# Patient Record
Sex: Male | Born: 1985
Health system: Southern US, Community
[De-identification: ages and names within clinical notes are randomized; demographics above are authoritative.]

## PROBLEM LIST (undated history)

## (undated) DIAGNOSIS — E785 Hyperlipidemia, unspecified: Secondary | ICD-10-CM

## (undated) DIAGNOSIS — K602 Anal fissure, unspecified: Secondary | ICD-10-CM

## (undated) DIAGNOSIS — Z8489 Family history of other specified conditions: Secondary | ICD-10-CM

## (undated) DIAGNOSIS — F419 Anxiety disorder, unspecified: Secondary | ICD-10-CM

## (undated) DIAGNOSIS — I10 Essential (primary) hypertension: Secondary | ICD-10-CM

## (undated) DIAGNOSIS — N200 Calculus of kidney: Secondary | ICD-10-CM

## (undated) DIAGNOSIS — R509 Fever, unspecified: Secondary | ICD-10-CM

## (undated) DIAGNOSIS — R7989 Other specified abnormal findings of blood chemistry: Secondary | ICD-10-CM

## (undated) DIAGNOSIS — Z0000 Encounter for general adult medical examination without abnormal findings: Secondary | ICD-10-CM

## (undated) DIAGNOSIS — R945 Abnormal results of liver function studies: Secondary | ICD-10-CM

## (undated) DIAGNOSIS — E538 Deficiency of other specified B group vitamins: Secondary | ICD-10-CM

## (undated) DIAGNOSIS — K219 Gastro-esophageal reflux disease without esophagitis: Secondary | ICD-10-CM

## (undated) DIAGNOSIS — I456 Pre-excitation syndrome: Secondary | ICD-10-CM

## (undated) DIAGNOSIS — J020 Streptococcal pharyngitis: Secondary | ICD-10-CM

## (undated) HISTORY — DX: Essential (primary) hypertension: I10

## (undated) HISTORY — DX: Encounter for general adult medical examination without abnormal findings: Z00.00

## (undated) HISTORY — DX: Anxiety disorder, unspecified: F41.9

## (undated) HISTORY — DX: Calculus of kidney: N20.0

## (undated) HISTORY — DX: Anal fissure, unspecified: K60.2

## (undated) HISTORY — DX: Streptococcal pharyngitis: J02.0

## (undated) HISTORY — DX: Abnormal results of liver function studies: R94.5

## (undated) HISTORY — DX: Deficiency of other specified B group vitamins: E53.8

## (undated) HISTORY — PX: SIGMOIDOSCOPY: SUR1295

## (undated) HISTORY — DX: Gastro-esophageal reflux disease without esophagitis: K21.9

## (undated) HISTORY — DX: Hyperlipidemia, unspecified: E78.5

## (undated) HISTORY — DX: Fever, unspecified: R50.9

## (undated) HISTORY — DX: Other specified abnormal findings of blood chemistry: R79.89

---
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
## 2005-03-15 ENCOUNTER — Ambulatory Visit (HOSPITAL_COMMUNITY): Admission: RE | Admit: 2005-03-15 | Discharge: 2005-03-15 | Payer: Self-pay | Admitting: Family Medicine

## 2005-10-08 ENCOUNTER — Ambulatory Visit (HOSPITAL_COMMUNITY): Admission: RE | Admit: 2005-10-08 | Discharge: 2005-10-08 | Payer: Self-pay | Admitting: Pediatrics

## 2007-01-14 ENCOUNTER — Ambulatory Visit (HOSPITAL_COMMUNITY): Admission: RE | Admit: 2007-01-14 | Discharge: 2007-01-14 | Payer: Self-pay | Admitting: Urology

## 2007-01-16 ENCOUNTER — Ambulatory Visit (HOSPITAL_COMMUNITY): Admission: RE | Admit: 2007-01-16 | Discharge: 2007-01-16 | Payer: Self-pay | Admitting: Urology

## 2008-08-08 ENCOUNTER — Ambulatory Visit (HOSPITAL_COMMUNITY): Admission: RE | Admit: 2008-08-08 | Discharge: 2008-08-08 | Payer: Self-pay | Admitting: Family Medicine

## 2008-11-06 IMAGING — CR DG ABDOMEN 1V
2 series · 2 of 2 positions shown · non-contrast
Comparison: 01/14/2007

CLINICAL DATA: Post lithotripsy

ABDOMEN - 1 VIEW

[view not recorded (1 of 2)]
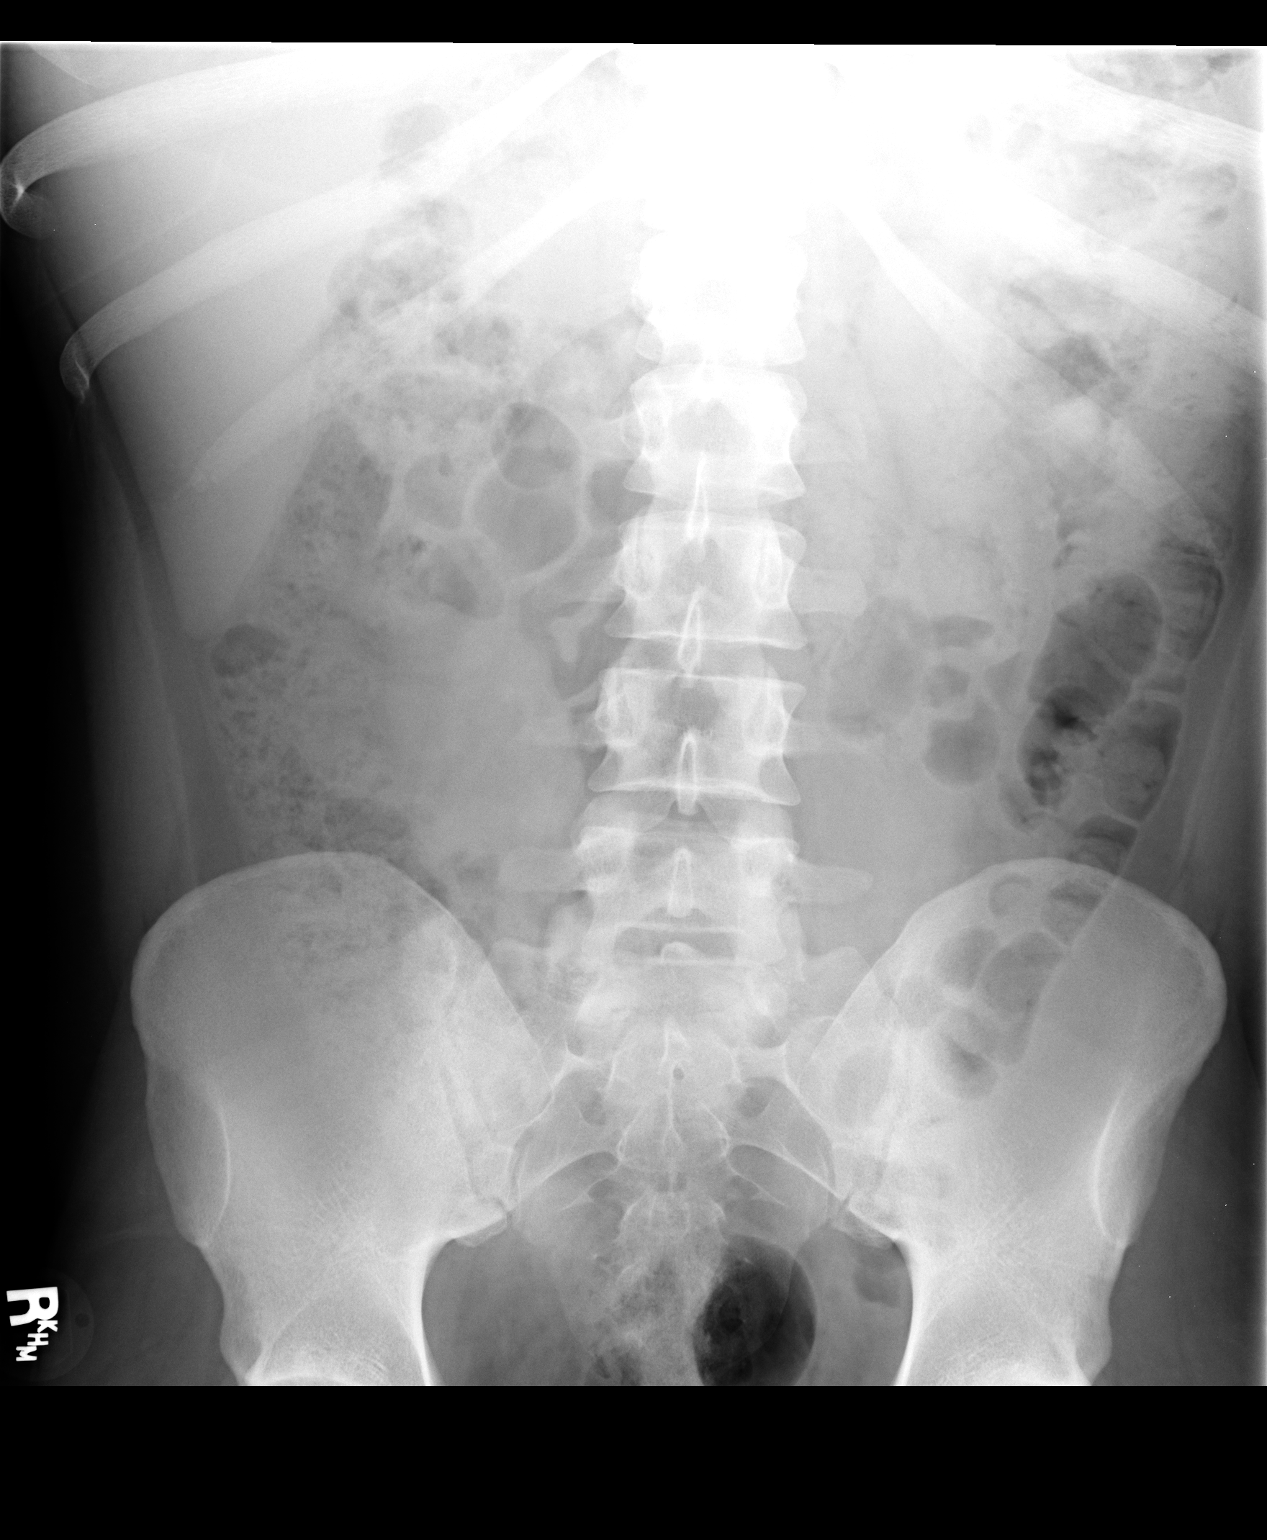

[view not recorded (2 of 2)]
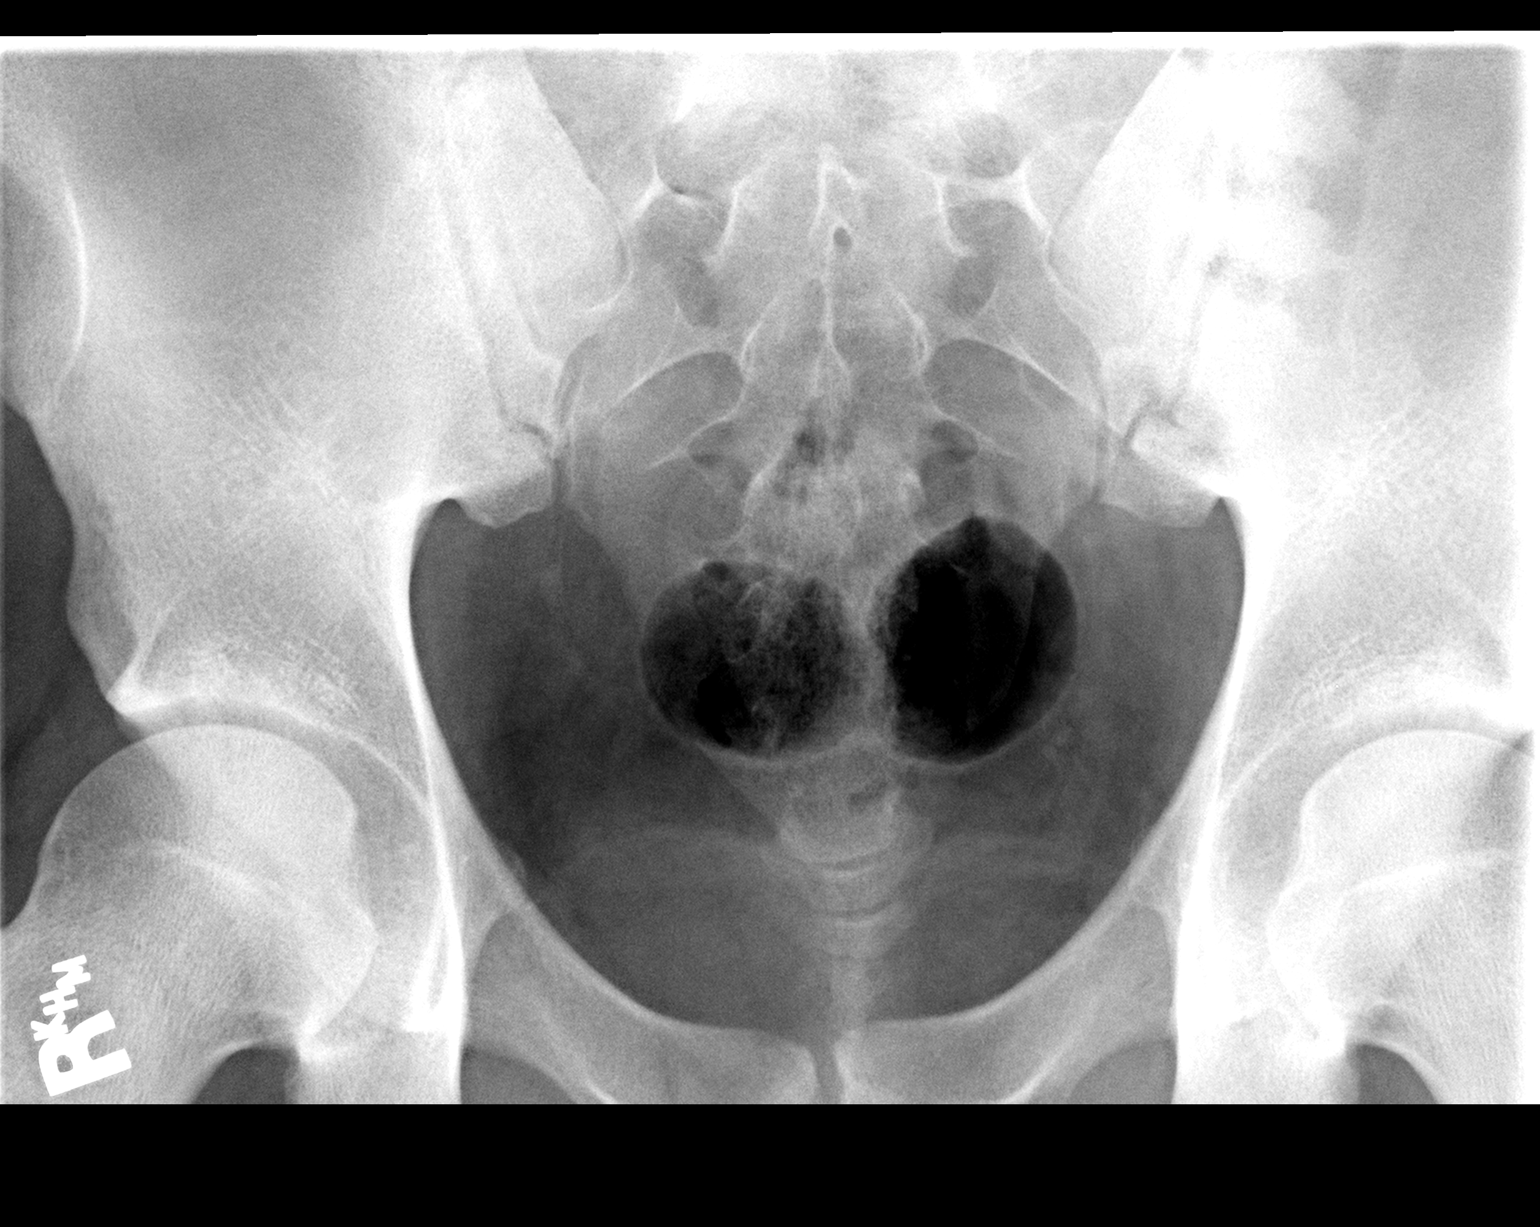

[2 of 2 positions shown; findings below may reference images not displayed]

FINDINGS: Decreasing described 13 mm calculus in the region of the left renal
pelvis is no longer visualized. No definite stones along the expected course of
the left ureter. No stones in the right. No organomegaly. Normal bowel gas
pattern.
IMPRESSION: Interval resolution of the left-sided stone. No definite calculi currently.

## 2010-06-12 NOTE — H&P (Signed)
NAME:  Nicholas Robertson, Nicholas Robertson NO.:  [id number]   MEDICAL RECORD NO.:  [id number]          PATIENT TYPE:  AMB   LOCATION:  DAY                           FACILITY:  [facility]   PHYSICIAN:  Dennie Maizes, M.D.   DATE OF BIRTH:  [birth year]   DATE OF ADMISSION:  01/14/2007  DATE OF DISCHARGE:  LH                              HISTORY & PHYSICAL   CHIEF COMPLAINT:  Left flank pain, large left renal calculus.   HISTORY OF PRESENT ILLNESS:  A 25 year old male has a history of  recurrent urolithiasis.  He passed a stone about a year ago.  He has  been evaluated for intermittent left flank pain.  CT scan of abdomen and  pelvis revealed an 11 x 7 mm size left renal calculus with mild  obstruction.  The patient is brought to the short stay center today for  extracorporeal shockwave lithotripsy of the left renal calculus.   The patient denied having any fever, chills, voiding difficulty, or  gross hematuria at present.  He has urinary frequency x2-3 and nocturia  x1.   PAST MEDICAL HISTORY:  Status post hernia repair during infancy.   MEDICATIONS:  Hydrocodone for pain.   ALLERGIES:  No known drug allergies.   FAMILY HISTORY:  Positive for kidney stones, diabetes mellitus, and  hypertension.   PHYSICAL EXAMINATION:  HEENT:  Normal.  NECK:  No masses.  LUNGS:  Clear to auscultation.  HEART:  Regular rate and rhythm.  No murmurs.  ABDOMEN:  Soft.  No palpable flank mass or CVA tenderness.  Bladder is  not palpable.  GENITOURINARY:  Penis and testes are normal.   IMPRESSION:  Large left renal calculus 11 x 7 mm in size with left flank  pain.   PLAN:  I have discussed with the patient regarding the management  options for the large left renal calculus.  He is scheduled to undergo  ESL of the stone.  I have discussed with the patient regarding the  diagnosis, operative details, alternative treatments, outcome, possible  risks and complications and he has agreed for the procedure  to be done.      Dennie Maizes, M.D.  Electronically Signed     SK/MEDQ  D:  01/13/2007  T:  01/13/2007  Job:  [id number]   cc:   Jeoffrey Massed, MD  Fax: [phone]

## 2011-01-29 HISTORY — PX: FLEXIBLE SIGMOIDOSCOPY: SHX1649

## 2012-10-19 ENCOUNTER — Ambulatory Visit (INDEPENDENT_AMBULATORY_CARE_PROVIDER_SITE_OTHER): Payer: 59 | Admitting: Family Medicine

## 2012-10-19 VITALS — BP 138/90 | HR 99 | Temp 98.3°F | Ht 70.0 in | Wt 216.0 lb

## 2012-10-19 DIAGNOSIS — J020 Streptococcal pharyngitis: Secondary | ICD-10-CM

## 2012-10-19 MED ORDER — CEFTRIAXONE SODIUM 500 MG IJ SOLR
500.0000 mg | Freq: Once | INTRAMUSCULAR | Status: AC
  Administered 2012-10-19: 500 mg via INTRAMUSCULAR

## 2012-10-20 ENCOUNTER — Encounter: Payer: Self-pay | Admitting: Family Medicine

## 2012-10-20 DIAGNOSIS — J020 Streptococcal pharyngitis: Secondary | ICD-10-CM | POA: Insufficient documentation

## 2012-10-20 HISTORY — DX: Streptococcal pharyngitis: J02.0

## 2012-10-20 NOTE — Progress Notes (Signed)
Patient ID: Nicholas Robertson, male   DOB: 05/01/1985, 27 y.o.   MRN: [id number] Nicholas Robertson [id number] Nov 13, 1985 10/20/2012      Progress Note-Follow Up  Subjective  Chief Complaint  Chief Complaint  Patient presents with  . Sore Throat    pts throat started hurting on Saturday and went to after hours and was diagnosed with strep. pt feels that his glands are more swollen now    HPI  Patient is a 27 yo male in today for evaluation of strep pharnygitis, was feeling ill over the weekend and went to urgent care. Testing confirmed strep and he was given PCN shot, initially he felt better but now his throat is swelling more and hurting more. Has had HA/fevers/malaise and just now some minor head congestion. No cough/cp/palp/sob/gu c/o.  Past Medical History  Diagnosis Date  . Strep pharyngitis 10/20/2012    History reviewed. No pertinent past surgical history.  History reviewed. No pertinent family history.  History   Social History  . Marital Status: Single    Spouse Name: N/A    Number of Children: N/A  . Years of Education: N/A   Occupational History  . Not on file.   Social History Main Topics  . Smoking status: Never Smoker   . Smokeless tobacco: Never Used  . Alcohol Use: No  . Drug Use: Not on file  . Sexual Activity: Not on file   Other Topics Concern  . Not on file   Social History Narrative  . No narrative on file    No current outpatient prescriptions on file prior to visit.   No current facility-administered medications on file prior to visit.    No Known Allergies  Review of Systems  Review of Systems  Constitutional: Positive for fever and malaise/fatigue.  HENT: Positive for congestion and sore throat.   Eyes: Negative for discharge.  Respiratory: Negative for cough, sputum production and shortness of breath.   Cardiovascular: Negative for chest pain, palpitations and leg swelling.  Gastrointestinal: Negative for nausea, abdominal pain  and diarrhea.  Genitourinary: Negative for dysuria.  Musculoskeletal: Positive for myalgias. Negative for falls.  Skin: Negative for rash.  Neurological: Positive for headaches. Negative for loss of consciousness.  Endo/Heme/Allergies: Negative for polydipsia.  Psychiatric/Behavioral: Negative for depression and suicidal ideas. The patient is not nervous/anxious and does not have insomnia.     Objective  BP 138/90  Pulse 99  Temp(Src) 98.3 F (36.8 C) (Oral)  Ht 5\' 10"  (1.778 m)  Wt 216 lb (97.977 kg)  BMI 30.99 kg/m2  SpO2 97%  Physical Exam  Physical Exam  Constitutional: He is oriented to person, place, and time and well-developed, well-nourished, and in no distress. No distress.  HENT:  Head: Normocephalic and atraumatic.  Oropharynx erythematous with 2 + tonsils b/l  Eyes: Conjunctivae are normal.  Neck: Neck supple. No thyromegaly present.  Cardiovascular: Normal rate, regular rhythm and normal heart sounds.   No murmur heard. Pulmonary/Chest: Effort normal and breath sounds normal. No respiratory distress.  Abdominal: He exhibits no distension and no mass. There is no tenderness.  Musculoskeletal: He exhibits no edema.  Neurological: He is alert and oriented to person, place, and time.  Skin: Skin is warm.  Psychiatric: Memory, affect and judgment normal.      Assessment & Plan  Strep pharyngitis Seen in urgent facility over the weekend, strep confirmed and PCN shot given, was intitally feeling better, now worse again. Given shot of Ceftriazone 500  mg IM once and encouraged probiotics, fluids and rest.

## 2012-10-20 NOTE — Assessment & Plan Note (Signed)
Seen in urgent facility over the weekend, strep confirmed and PCN shot given, was intitally feeling better, now worse again. Given shot of Ceftriazone 500 mg IM once and encouraged probiotics, fluids and rest.

## 2012-12-17 ENCOUNTER — Telehealth: Payer: Self-pay | Admitting: Family Medicine

## 2012-12-17 DIAGNOSIS — L309 Dermatitis, unspecified: Secondary | ICD-10-CM

## 2012-12-17 MED ORDER — CLOBETASOL PROPIONATE 0.05 % EX CREA: 1.0000 "application " | TOPICAL_CREAM | Freq: Two times a day (BID) | CUTANEOUS | Status: DC | PRN

## 2012-12-17 NOTE — Telephone Encounter (Signed)
Recurrent and worsening dermatitis. Rx given for Clobetasol, present for evaluation if does not improve

## 2013-01-01 ENCOUNTER — Other Ambulatory Visit: Payer: Self-pay

## 2013-01-01 MED ORDER — AMOXICILLIN-POT CLAVULANATE ER 1000-62.5 MG PO TB12: 1.0000 | ORAL_TABLET | Freq: Two times a day (BID) | ORAL | Status: DC

## 2013-01-01 NOTE — Telephone Encounter (Signed)
Per Dr Abner Greenspan send in Augmentin XR bid X 10 days for pharyngitis.  RX sent

## 2013-02-04 ENCOUNTER — Ambulatory Visit (INDEPENDENT_AMBULATORY_CARE_PROVIDER_SITE_OTHER): Payer: 59 | Admitting: Family Medicine

## 2013-02-04 ENCOUNTER — Ambulatory Visit: Payer: 59 | Admitting: Family Medicine

## 2013-02-04 ENCOUNTER — Encounter: Payer: Self-pay | Admitting: Family Medicine

## 2013-02-04 VITALS — BP 138/102 | HR 90 | Temp 98.3°F | Ht 70.0 in | Wt 220.4 lb

## 2013-02-04 DIAGNOSIS — I10 Essential (primary) hypertension: Secondary | ICD-10-CM

## 2013-02-04 DIAGNOSIS — R5381 Other malaise: Secondary | ICD-10-CM

## 2013-02-04 DIAGNOSIS — R945 Abnormal results of liver function studies: Secondary | ICD-10-CM

## 2013-02-04 DIAGNOSIS — E785 Hyperlipidemia, unspecified: Secondary | ICD-10-CM

## 2013-02-04 DIAGNOSIS — R7989 Other specified abnormal findings of blood chemistry: Secondary | ICD-10-CM

## 2013-02-04 DIAGNOSIS — Z0000 Encounter for general adult medical examination without abnormal findings: Secondary | ICD-10-CM

## 2013-02-04 DIAGNOSIS — R5383 Other fatigue: Secondary | ICD-10-CM

## 2013-02-04 LAB — CBC
HCT: 44.7 % (ref 39.0–52.0)
HEMOGLOBIN: 15.6 g/dL (ref 13.0–17.0)
MCH: 31.1 pg (ref 26.0–34.0)
MCHC: 34.9 g/dL (ref 30.0–36.0)
MCV: 89 fL (ref 78.0–100.0)
Platelets: 248 10*3/uL (ref 150–400)
RBC: 5.02 MIL/uL (ref 4.22–5.81)
RDW: 13.8 % (ref 11.5–15.5)
WBC: 6.6 10*3/uL (ref 4.0–10.5)

## 2013-02-04 MED ORDER — METOPROLOL SUCCINATE ER 25 MG PO TB24: 25.0000 mg | ORAL_TABLET | Freq: Every day | ORAL | Status: DC

## 2013-02-04 NOTE — Progress Notes (Signed)
Pre visit review using our clinic review tool, if applicable. No additional management support is needed unless otherwise documented below in the visit note. 

## 2013-02-05 LAB — HEPATIC FUNCTION PANEL
ALK PHOS: 46 U/L (ref 39–117)
ALT: 68 U/L — AB (ref 0–53)
AST: 35 U/L (ref 0–37)
Albumin: 4.7 g/dL (ref 3.5–5.2)
BILIRUBIN DIRECT: 0.1 mg/dL (ref 0.0–0.3)
Indirect Bilirubin: 0.6 mg/dL (ref 0.0–0.9)
Total Bilirubin: 0.7 mg/dL (ref 0.3–1.2)
Total Protein: 7 g/dL (ref 6.0–8.3)

## 2013-02-05 LAB — LIPID PANEL
CHOL/HDL RATIO: 4.7 ratio
CHOLESTEROL: 231 mg/dL — AB (ref 0–200)
HDL: 49 mg/dL (ref >39)
LDL CALC: 159 mg/dL — AB (ref 0–99)
TRIGLYCERIDES: 115 mg/dL (ref <150)
VLDL: 23 mg/dL (ref 0–40)

## 2013-02-05 LAB — RENAL FUNCTION PANEL
Albumin: 4.7 g/dL (ref 3.5–5.2)
BUN: 10 mg/dL (ref 6–23)
CALCIUM: 9.8 mg/dL (ref 8.4–10.5)
CHLORIDE: 105 meq/L (ref 96–112)
CO2: 26 meq/L (ref 19–32)
CREATININE: 0.81 mg/dL (ref 0.50–1.35)
Glucose, Bld: 80 mg/dL (ref 70–99)
POTASSIUM: 4.3 meq/L (ref 3.5–5.3)
Phosphorus: 3.8 mg/dL (ref 2.3–4.6)
SODIUM: 139 meq/L (ref 135–145)

## 2013-02-05 LAB — TSH: TSH: 2.442 u[IU]/mL (ref 0.350–4.500)

## 2013-02-07 ENCOUNTER — Encounter: Payer: Self-pay | Admitting: Family Medicine

## 2013-02-07 DIAGNOSIS — R945 Abnormal results of liver function studies: Secondary | ICD-10-CM | POA: Insufficient documentation

## 2013-02-07 DIAGNOSIS — I10 Essential (primary) hypertension: Secondary | ICD-10-CM | POA: Insufficient documentation

## 2013-02-07 DIAGNOSIS — E782 Mixed hyperlipidemia: Secondary | ICD-10-CM | POA: Insufficient documentation

## 2013-02-07 DIAGNOSIS — E785 Hyperlipidemia, unspecified: Secondary | ICD-10-CM | POA: Insufficient documentation

## 2013-02-07 DIAGNOSIS — R7989 Other specified abnormal findings of blood chemistry: Secondary | ICD-10-CM | POA: Insufficient documentation

## 2013-02-07 HISTORY — DX: Hyperlipidemia, unspecified: E78.5

## 2013-02-07 HISTORY — DX: Essential (primary) hypertension: I10

## 2013-02-07 NOTE — Assessment & Plan Note (Signed)
Mild, avoid simple carbs, trans fats and increase exercise

## 2013-02-07 NOTE — Progress Notes (Signed)
Patient ID: Nicholas Robertson, male   DOB: 12-21-1985, 28 y.o.   MRN: [id number] Nicholas Robertson [id number] 19-Apr-1985 02/07/2013      Progress Note-Follow Up  Subjective  Chief Complaint  Chief Complaint  Patient presents with  . Hypertension    blood pressure has been running high  . Dizziness    and light headed     HPI  Patient is a 28 year old male who is in today with complaints of feeling somewhat flushed and lightheaded. Has been monitoring his blood pressure and it is running high. He has seeing numbers in the 150s over 100. No chest pain or palpitations. No shortness of breath GI or GU concerns noted.  Past Medical History  Diagnosis Date  . Strep pharyngitis 10/20/2012  . HTN (hypertension) 02/07/2013  . Other and unspecified hyperlipidemia 02/07/2013  . Abnormal LFTs 02/07/2013    History reviewed. No pertinent past surgical history.  Family History  Problem Relation Age of Onset  . Hypertension Mother   . Hyperlipidemia Mother   . Diabetes Mother     pre  . Mental illness Mother     anxiety  . Diabetes Father   . Hypertension Father   . COPD Maternal Grandmother     former smoker  . Heart disease Maternal Grandfather   . Hyperlipidemia Maternal Grandfather   . Diabetes Maternal Grandfather     type 1  . Hearing loss Maternal Grandfather     cabg  . Hypertension Paternal Grandmother   . Psoriasis Paternal Grandmother   . Heart disease Paternal Grandfather     s/p MI  . Hyperlipidemia Paternal Grandfather   . Hypertension Paternal Grandfather   . Polymyalgia rheumatica Paternal Grandfather   . Diabetes Paternal Grandfather     History   Social History  . Marital Status: Single    Spouse Name: N/A    Number of Children: N/A  . Years of Education: N/A   Occupational History  . Not on file.   Social History Main Topics  . Smoking status: Never Smoker   . Smokeless tobacco: Never Used  . Alcohol Use: No  . Drug Use: No  . Sexual Activity: Not  on file     Comment: no dietary restricitons, lives alone   Other Topics Concern  . Not on file   Social History Narrative  . No narrative on file    No current outpatient prescriptions on file prior to visit.   No current facility-administered medications on file prior to visit.    No Known Allergies  Review of Systems  Review of Systems  Constitutional: Negative for fever and malaise/fatigue.  HENT: Negative for congestion.   Eyes: Negative for discharge.  Respiratory: Negative for shortness of breath.   Cardiovascular: Negative for chest pain, palpitations and leg swelling.  Gastrointestinal: Negative for nausea, abdominal pain and diarrhea.  Genitourinary: Negative for dysuria.  Musculoskeletal: Negative for falls.  Skin: Negative for rash.  Neurological: Positive for dizziness. Negative for loss of consciousness and headaches.  Endo/Heme/Allergies: Negative for polydipsia.  Psychiatric/Behavioral: Negative for depression and suicidal ideas. The patient is nervous/anxious. The patient does not have insomnia.     Objective  BP 138/102  Pulse 90  Temp(Src) 98.3 F (36.8 C) (Oral)  Ht 5\' 10"  (1.778 m)  Wt 220 lb 6.4 oz (99.973 kg)  BMI 31.62 kg/m2  SpO2 96%  Physical Exam  Physical Exam  Constitutional: He is oriented to person, place, and time and well-developed,  well-nourished, and in no distress. No distress.  HENT:  Head: Normocephalic and atraumatic.  Eyes: Conjunctivae are normal.  Neck: Neck supple. No thyromegaly present.  Cardiovascular: Normal rate, regular rhythm and normal heart sounds.   No murmur heard. Pulmonary/Chest: Effort normal and breath sounds normal. No respiratory distress.  Abdominal: He exhibits no distension and no mass. There is no tenderness.  Musculoskeletal: He exhibits no edema.  Neurological: He is alert and oriented to person, place, and time.  Skin: Skin is warm.  Psychiatric: Memory, affect and judgment normal.    Lab  Results  Component Value Date   TSH 2.442 02/04/2013   Lab Results  Component Value Date   WBC 6.6 02/04/2013   HGB 15.6 02/04/2013   HCT 44.7 02/04/2013   MCV 89.0 02/04/2013   PLT 248 02/04/2013   Lab Results  Component Value Date   CREATININE 0.81 02/04/2013   BUN 10 02/04/2013   NA 139 02/04/2013   K 4.3 02/04/2013   CL 105 02/04/2013   CO2 26 02/04/2013   Lab Results  Component Value Date   ALT 68* 02/04/2013   AST 35 02/04/2013   ALKPHOS 46 02/04/2013   BILITOT 0.7 02/04/2013   Lab Results  Component Value Date   CHOL 231* 02/04/2013   Lab Results  Component Value Date   HDL 49 02/04/2013   Lab Results  Component Value Date   LDLCALC 159* 02/04/2013   Lab Results  Component Value Date   TRIG 115 02/04/2013   Lab Results  Component Value Date   CHOLHDL 4.7 02/04/2013     Assessment & Plan  Abnormal LFTs Mild, avoid simple carbs, trans fats and increase exercise  HTN (hypertension) Started on metoprolol XL and encouraged to try dash diet  Other and unspecified hyperlipidemia Mild, avoid trans fats, minimize simple carbs and saturated fats, increase exercise

## 2013-02-07 NOTE — Assessment & Plan Note (Signed)
Started on metoprolol XL and encouraged to try dash diet

## 2013-02-07 NOTE — Assessment & Plan Note (Signed)
Mild, avoid trans fats, minimize simple carbs and saturated fats, increase exercise

## 2013-02-08 ENCOUNTER — Encounter: Payer: Self-pay | Admitting: Family Medicine

## 2013-02-08 ENCOUNTER — Ambulatory Visit (INDEPENDENT_AMBULATORY_CARE_PROVIDER_SITE_OTHER): Payer: 59 | Admitting: Family Medicine

## 2013-02-08 VITALS — BP 138/96 | HR 80 | Temp 97.8°F | Ht 70.0 in

## 2013-02-08 DIAGNOSIS — I4729 Other ventricular tachycardia: Secondary | ICD-10-CM

## 2013-02-08 DIAGNOSIS — I456 Pre-excitation syndrome: Secondary | ICD-10-CM

## 2013-02-08 DIAGNOSIS — I10 Essential (primary) hypertension: Secondary | ICD-10-CM

## 2013-02-08 DIAGNOSIS — R000 Tachycardia, unspecified: Secondary | ICD-10-CM

## 2013-02-08 DIAGNOSIS — I472 Ventricular tachycardia: Secondary | ICD-10-CM

## 2013-02-08 NOTE — Progress Notes (Signed)
Patient ID: Nicholas Robertson, male   DOB: 12-23-1985, 28 y.o.   MRN: [id number] Nicholas Robertson [id number] 11-03-1985 02/08/2013      Progress Note-Follow Up  Subjective  Chief Complaint  Chief Complaint  Patient presents with  . Follow-up    high blood pressure    HPI  Patient is a 28 year old Caucasian male who presents today with significant tachycardia. 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 He became flushed, fatigued and diaphoretic. He denies any chest pain, nausea, syncope or presyncope. He tried to continue working but after checking his pulse and finding it to be 2:15 he agreed to sit quietly. He was given metoprolol 50 mg once and his pulse began to normalize. As his pulse normalized his color returned and his diaphoresis resolved. He was comfortable without any shortness of breath, chest pain, presyncope, palpitations or fatigue. He reports having a similar episode in roughly 2000 and after bending over he said that the findings heart racing. At that time he was able to sit quietly until the symptoms resolved. Of note he has recently been started on metoprolol extended release for his elevated blood pressure but had forgotten to take his dose this morning.   Past Medical History  Diagnosis Date  . Strep pharyngitis 10/20/2012  . HTN (hypertension) 02/07/2013  . Other and unspecified hyperlipidemia 02/07/2013  . Abnormal LFTs 02/07/2013  . Wide-complex tachycardia 02/08/2013    History reviewed. No pertinent past surgical history.  Family History  Problem Relation Age of Onset  . Hypertension Mother   . Hyperlipidemia Mother   . Diabetes Mother     pre  . Mental illness Mother     anxiety  . Diabetes Father   . Hypertension Father   . COPD Maternal Grandmother     former smoker  . Heart disease Maternal Grandfather   . Hyperlipidemia Maternal Grandfather   . Diabetes  Maternal Grandfather     type 1  . Hearing loss Maternal Grandfather     cabg  . Hypertension Paternal Grandmother   . Psoriasis Paternal Grandmother   . Heart disease Paternal Grandfather     s/p MI  . Hyperlipidemia Paternal Grandfather   . Hypertension Paternal Grandfather   . Polymyalgia rheumatica Paternal Grandfather   . Diabetes Paternal Grandfather     History   Social History  . Marital Status: Single    Spouse Name: N/A    Number of Children: N/A  . Years of Education: N/A   Occupational History  . Not on file.   Social History Main Topics  . Smoking status: Never Smoker   . Smokeless tobacco: Never Used  . Alcohol Use: No  . Drug Use: No  . Sexual Activity: Not on file     Comment: no dietary restricitons, lives alone   Other Topics Concern  . Not on file   Social History Narrative  . No narrative on file    Current Outpatient Prescriptions on File Prior to Visit  Medication Sig Dispense Refill  . metoprolol succinate (TOPROL XL) 25 MG 24 hr tablet Take 1 tablet (25 mg total) by mouth daily.  30 tablet  1   No current facility-administered medications on file prior to visit.    No Known Allergies  Review of Systems  Review of Systems  Constitutional: Positive for malaise/fatigue and diaphoresis. Negative for fever.  HENT:  Negative for congestion.   Eyes: Negative for discharge.  Respiratory: Negative for shortness of breath.   Cardiovascular: Positive for palpitations. Negative for chest pain and leg swelling.  Gastrointestinal: Negative for nausea, abdominal pain and diarrhea.  Genitourinary: Negative for dysuria.  Musculoskeletal: Negative for falls.  Skin: Negative for rash.  Neurological: Negative for loss of consciousness and headaches.  Endo/Heme/Allergies: Negative for polydipsia.  Psychiatric/Behavioral: Negative for depression and suicidal ideas. The patient is not nervous/anxious and does not have insomnia.     Objective  BP  138/96  Pulse 80  Temp(Src) 97.8 F (36.6 C) (Oral)  Ht 5\' 10"  (1.778 m)  SpO2 98%  Physical Exam  Physical Exam  Constitutional: He is oriented to person, place, and time and well-developed, well-nourished, and in no distress. No distress.  HENT:  Head: Normocephalic and atraumatic.  Eyes: Conjunctivae are normal.  Neck: Neck supple. No thyromegaly present.  Cardiovascular: Normal heart sounds.   No murmur heard. tachycardia  Pulmonary/Chest: Effort normal and breath sounds normal. No respiratory distress.  Abdominal: He exhibits no distension and no mass. There is no tenderness.  Musculoskeletal: He exhibits no edema.  Neurological: He is alert and oriented to person, place, and time.  Skin: Skin is warm.  Psychiatric: Memory, affect and judgment normal.    Lab Results  Component Value Date   TSH 2.442 02/04/2013   Lab Results  Component Value Date   WBC 6.6 02/04/2013   HGB 15.6 02/04/2013   HCT 44.7 02/04/2013   MCV 89.0 02/04/2013   PLT 248 02/04/2013   Lab Results  Component Value Date   CREATININE 0.81 02/04/2013   BUN 10 02/04/2013   NA 139 02/04/2013   K 4.3 02/04/2013   CL 105 02/04/2013   CO2 26 02/04/2013   Lab Results  Component Value Date   ALT 68* 02/04/2013   AST 35 02/04/2013   ALKPHOS 46 02/04/2013   BILITOT 0.7 02/04/2013   Lab Results  Component Value Date   CHOL 231* 02/04/2013   Lab Results  Component Value Date   HDL 49 02/04/2013   Lab Results  Component Value Date   LDLCALC 159* 02/04/2013   Lab Results  Component Value Date   TRIG 115 02/04/2013   Lab Results  Component Value Date   CHOLHDL 4.7 02/04/2013     Assessment & Plan  No problem-specific assessment & plan notes found for this encounter.

## 2013-02-08 NOTE — Assessment & Plan Note (Addendum)
As high as 215 in clinic, with EKG rate was normal but wide QRS complex noted with short PR interval. Discussed case with Dr Angelena Form. With previous episode noted by symptoms in 2010 and patient being symptomatic. Patient would benefit from EP studies, have arranged for urgent consultation. Patient agrees to take it easy today and seek care if worsens. At time of discharge he was asymptomatic. Visit was 30 minutes in duration

## 2013-02-08 NOTE — Patient Instructions (Signed)
Wolff-Parkinson-White Syndrome Wolff-Parkinson-White Syndrome (WPW) is an abnormal heart condition that can cause the heart to beat very fast. CAUSES  In WPW syndrome, an extra electrical connection (pathway) exists between the top chambers of your heart (atria) and the bottom chambers of your heart (ventricles). This is known as an extra (accessory) pathway. This extra pathway can cause the heart to short circuit and beat very fast. SYMPTOMS  Symptoms in WPW syndrome can vary. These include:  Feeling your heart "skip" beats (palpitations).  Dizziness.  Fainting or near fainting.  Sudden death. DIAGNOSIS  WPW can be diagnosed by different test such as:  ECG (electrocardiogram).  Echocardiogram.  Holter monitoring.  Stress testing.  Electrophysiology study.  Laboratory tests that check certain blood levels.  Thyroid study. TREATMENT  WPW is usually treated in two ways:  Radiofrequency destruction (ablation). In this procedure, a thin, flexible tube (catheter) is placed in the heart through a vein in the upper leg (groin). The catheter is guided to the extra pathway. The extra pathway is destroyed by using high frequency radio waves.  Medicine can sometimes be used to treat WPW. SEEK IMMEDIATE MEDICAL CARE IF:   You feel palpitations that are frequent or continual.  You develop chest pain and also have:  Shortness of breath or difficulty breathing.  Nausea and vomiting.  Sweating.  You become light-headed or faint (pass out). MAKE SURE YOU:   Understand these instructions.  Will watch your condition.  Will get help right away if you are not doing well or get worse. Document Released: 04/06/2003 Document Revised: 04/08/2011 Document Reviewed: 04/02/2008 [facility] Patient Information 2014 [place], [place].

## 2013-02-08 NOTE — Progress Notes (Signed)
Pre visit review using our clinic review tool, if applicable. No additional management support is needed unless otherwise documented below in the visit note. 

## 2013-02-08 NOTE — Assessment & Plan Note (Signed)
Improved after administration of Metoprolol. He agrees to restart his XL Metoprolol. Minimize sodium and caffeine

## 2013-02-09 ENCOUNTER — Telehealth: Payer: Self-pay | Admitting: Family Medicine

## 2013-02-09 NOTE — Telephone Encounter (Signed)
Relevant patient education assigned to patient using Emmi. ° °

## 2013-02-17 ENCOUNTER — Encounter: Payer: Self-pay | Admitting: Internal Medicine

## 2013-02-17 ENCOUNTER — Ambulatory Visit (INDEPENDENT_AMBULATORY_CARE_PROVIDER_SITE_OTHER): Payer: 59 | Admitting: Internal Medicine

## 2013-02-17 VITALS — BP 140/90 | HR 85 | Ht 70.0 in | Wt 221.0 lb

## 2013-02-17 DIAGNOSIS — I4729 Other ventricular tachycardia: Secondary | ICD-10-CM

## 2013-02-17 DIAGNOSIS — I472 Ventricular tachycardia: Secondary | ICD-10-CM

## 2013-02-17 DIAGNOSIS — I456 Pre-excitation syndrome: Secondary | ICD-10-CM

## 2013-02-17 DIAGNOSIS — R000 Tachycardia, unspecified: Secondary | ICD-10-CM

## 2013-02-17 DIAGNOSIS — I10 Essential (primary) hypertension: Secondary | ICD-10-CM

## 2013-02-17 HISTORY — DX: Pre-excitation syndrome: I45.6

## 2013-02-17 NOTE — Patient Instructions (Signed)
Your physician recommends that you continue on your current medications as directed. Please refer to the Current Medication list given to you today.  Please call back with date for ablation WPW: - ask to speak with Janan Halter, RN  2/9  2/12  2/16

## 2013-02-17 NOTE — Assessment & Plan Note (Signed)
The patient has symptomatic Wolff-Parkinson-White syndrome despite medical therapy with beta blockers. I discussed the treatment options with the patient in detail. The risk, goals, benefits, and expectations of catheter ablation have been discussed. He wishes to proceed with this at the earliest possible pending at time.

## 2013-02-17 NOTE — Progress Notes (Signed)
HPI Mr. Nicholas Robertson is referred today by Dr. 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 He has a history of hypertension. He has remote history of tachycardia palpitations lasting minutes. He was placed on antihypertensive medications, metoprolol, several weeks ago. He experienced an episode of prolonged palpitations lasting over 5 minutes. He was placed on a monitor in his physician's office and was found to have tachycardia at a rate of 220 beats per minute. Unfortunately, we do not have a 12-lead electrocardiogram of the tachycardia but it was witnessed in the physician's office. Subsequent electrocardiogram demonstrated sinus rhythm with marked ventricular preexcitation consistent with WPW syndrome. Review of the patient's electrograms suggest that he has a right free wall accessory pathway. During tachycardia, he feels palpitations and a little bit lightheaded. No syncope, no chest pain, and minimal shortness of breath. He is diaphoretic. He is referred now to consider catheter ablation.  No Known Allergies   Current Outpatient Prescriptions  Medication Sig Dispense Refill  . metoprolol succinate (TOPROL XL) 25 MG 24 hr tablet Take 1 tablet (25 mg total) by mouth daily.  30 tablet  1   No current facility-administered medications for this visit.     Past Medical History  Diagnosis Date  . Strep pharyngitis 10/20/2012  . HTN (hypertension) 02/07/2013  . Other and unspecified hyperlipidemia 02/07/2013  . Abnormal LFTs 02/07/2013  . 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   History reviewed. No pertinent past surgical history.   Family History  Problem Relation Age of Onset  . Hypertension Mother   . Hyperlipidemia Mother   . Diabetes Mother     pre  . Mental illness Mother     anxiety  . Diabetes Father   .  Hypertension Father   . COPD Maternal Grandmother     former smoker  . Heart disease Maternal Grandfather   . Hyperlipidemia Maternal Grandfather   . Diabetes Maternal Grandfather     type 1  . Hearing loss Maternal Grandfather     cabg  . Hypertension Paternal Grandmother   . Psoriasis Paternal Grandmother   . Heart disease Paternal Grandfather     s/p MI  . Hyperlipidemia Paternal Grandfather   . Hypertension Paternal Grandfather   . Polymyalgia rheumatica Paternal Grandfather   . Diabetes Paternal Grandfather      History   Social History  . Marital Status: Single    Spouse Name: N/A    Number of Children: N/A  . Years of Education: N/A   Occupational History  . Not on file.   Social History Main Topics  . Smoking status: Never Smoker   . Smokeless tobacco: Never Used  . Alcohol Use: No  . Drug Use: No  . Sexual Activity: Not on file     Comment: no dietary restricitons, lives alone   Other Topics Concern  . Not on file   Social History Narrative  . No narrative on file     BP 140/90  Pulse 85  Ht 5\' 10"  (1.778 m)  Wt 221 lb (100.245 kg)  BMI 31.71 kg/m2  Physical Exam:  Well appearing young man, NAD HEENT: Unremarkable Neck:  No JVD, no thyromegally Back:  No CVA tenderness Lungs:  Clear with no wheezes, rales, or rhonchi. HEART:  Regular rate rhythm, no murmurs, no rubs,  no clicks Abd:  Soft, positive bowel sounds, no organomegally, no rebound, no guarding Ext:  2 plus pulses, no edema, no cyanosis, no clubbing Skin:  No rashes no nodules Neuro:  CN II through XII intact, motor grossly intact  EKG - normal sinus rhythm with manifest ventricular preexcitation.   Assess/Plan:

## 2013-02-17 NOTE — Assessment & Plan Note (Signed)
His blood pressure has been elevated. Besides weight loss, I would recommend discontinuation of his beta blocker after his ablation and consideration for alternative antihypertensive medications.

## 2013-02-19 ENCOUNTER — Telehealth: Payer: Self-pay | Admitting: Internal Medicine

## 2013-02-19 DIAGNOSIS — I456 Pre-excitation syndrome: Secondary | ICD-10-CM

## 2013-02-19 NOTE — Telephone Encounter (Signed)
New message   pt called to schedule an ablation. Please call.

## 2013-02-19 NOTE — Telephone Encounter (Signed)
F/u ° ° °Pt returning your call °

## 2013-02-19 NOTE — Telephone Encounter (Signed)
lmom for patient to return my call to schedule procedure

## 2013-02-19 NOTE — Telephone Encounter (Signed)
lmom for pt again to return call

## 2013-02-22 ENCOUNTER — Encounter (HOSPITAL_COMMUNITY): Payer: Self-pay | Admitting: Pharmacy Technician

## 2013-02-22 NOTE — Telephone Encounter (Signed)
Will sch for 03/08/13 and have labs 03/01/13

## 2013-02-23 ENCOUNTER — Encounter: Payer: Self-pay | Admitting: *Deleted

## 2013-02-23 ENCOUNTER — Other Ambulatory Visit: Payer: Self-pay | Admitting: *Deleted

## 2013-03-01 ENCOUNTER — Telehealth: Payer: Self-pay | Admitting: Internal Medicine

## 2013-03-01 ENCOUNTER — Other Ambulatory Visit (INDEPENDENT_AMBULATORY_CARE_PROVIDER_SITE_OTHER): Payer: 59

## 2013-03-01 DIAGNOSIS — I456 Pre-excitation syndrome: Secondary | ICD-10-CM

## 2013-03-01 LAB — CBC WITH DIFFERENTIAL/PLATELET
BASOS ABS: 0 10*3/uL (ref 0.0–0.1)
Basophils Relative: 0.6 % (ref 0.0–3.0)
EOS ABS: 0.1 10*3/uL (ref 0.0–0.7)
EOS PCT: 1.5 % (ref 0.0–5.0)
HCT: 47.7 % (ref 39.0–52.0)
Hemoglobin: 16 g/dL (ref 13.0–17.0)
Lymphocytes Relative: 37.3 % (ref 12.0–46.0)
Lymphs Abs: 2.3 10*3/uL (ref 0.7–4.0)
MCHC: 33.5 g/dL (ref 30.0–36.0)
MCV: 94.5 fl (ref 78.0–100.0)
MONO ABS: 0.6 10*3/uL (ref 0.1–1.0)
Monocytes Relative: 9.9 % (ref 3.0–12.0)
NEUTROS PCT: 50.7 % (ref 43.0–77.0)
Neutro Abs: 3.1 10*3/uL (ref 1.4–7.7)
PLATELETS: 226 10*3/uL (ref 150.0–400.0)
RBC: 5.05 Mil/uL (ref 4.22–5.81)
RDW: 12.9 % (ref 11.5–14.6)
WBC: 6.1 10*3/uL (ref 4.5–10.5)

## 2013-03-01 LAB — BASIC METABOLIC PANEL
BUN: 11 mg/dL (ref 6–23)
CALCIUM: 9.4 mg/dL (ref 8.4–10.5)
CHLORIDE: 105 meq/L (ref 96–112)
CO2: 29 mEq/L (ref 19–32)
Creatinine, Ser: 0.9 mg/dL (ref 0.4–1.5)
GFR: 107.2 mL/min (ref >60.00)
GLUCOSE: 96 mg/dL (ref 70–99)
POTASSIUM: 4.2 meq/L (ref 3.5–5.1)
SODIUM: 140 meq/L (ref 135–145)

## 2013-03-01 NOTE — Telephone Encounter (Signed)
Left message again for patient to call me in regards to his procedure instructions

## 2013-03-01 NOTE — Telephone Encounter (Signed)
New message   Patient came this am for blood work . Staff told them you were not here yet.     upcoming procedure information.

## 2013-03-01 NOTE — Telephone Encounter (Signed)
Left message for patient to call me with a fax number and I will fax him his instruction sheet.  Apologized for him being told I was not here today

## 2013-03-02 ENCOUNTER — Encounter: Payer: 59 | Admitting: Family Medicine

## 2013-03-02 NOTE — Telephone Encounter (Signed)
Spoke with patient and gave him his instructions.  He had no further questions

## 2013-03-03 ENCOUNTER — Other Ambulatory Visit: Payer: Self-pay | Admitting: Family Medicine

## 2013-03-03 DIAGNOSIS — IMO0002 Reserved for concepts with insufficient information to code with codable children: Secondary | ICD-10-CM

## 2013-03-03 MED ORDER — MUPIROCIN 2 % EX OINT: 1.0000 "application " | TOPICAL_OINTMENT | Freq: Two times a day (BID) | CUTANEOUS | Status: DC

## 2013-03-03 NOTE — Progress Notes (Signed)
Patient with paronychia, given rx for Mupirocin and encouraged to clean with H2O2

## 2013-03-08 ENCOUNTER — Encounter (HOSPITAL_COMMUNITY)
Admission: RE | Disposition: A | Discharge status: Home / Self Care | Payer: Self-pay | Source: Ambulatory Visit | Attending: Internal Medicine

## 2013-03-08 ENCOUNTER — Ambulatory Visit (HOSPITAL_COMMUNITY)
Admission: RE | Admit: 2013-03-08 | Discharge: 2013-03-09 | Disposition: A | Discharge status: Home / Self Care | Payer: 59 | Source: Ambulatory Visit | Attending: Internal Medicine | Admitting: Internal Medicine

## 2013-03-08 DIAGNOSIS — I472 Ventricular tachycardia: Secondary | ICD-10-CM

## 2013-03-08 DIAGNOSIS — R000 Tachycardia, unspecified: Secondary | ICD-10-CM

## 2013-03-08 DIAGNOSIS — I456 Pre-excitation syndrome: Secondary | ICD-10-CM | POA: Diagnosis present

## 2013-03-08 DIAGNOSIS — I471 Supraventricular tachycardia, unspecified: Secondary | ICD-10-CM

## 2013-03-08 DIAGNOSIS — R945 Abnormal results of liver function studies: Secondary | ICD-10-CM

## 2013-03-08 DIAGNOSIS — R7989 Other specified abnormal findings of blood chemistry: Secondary | ICD-10-CM

## 2013-03-08 DIAGNOSIS — I10 Essential (primary) hypertension: Secondary | ICD-10-CM | POA: Insufficient documentation

## 2013-03-08 DIAGNOSIS — E785 Hyperlipidemia, unspecified: Secondary | ICD-10-CM | POA: Insufficient documentation

## 2013-03-08 HISTORY — PX: SUPRAVENTRICULAR TACHYCARDIA ABLATION: SHX5492

## 2013-03-08 HISTORY — DX: Pre-excitation syndrome: I45.6

## 2013-03-08 SURGERY — SUPRAVENTRICULAR TACHYCARDIA ABLATION
Anesthesia: LOCAL

## 2013-03-08 MED ORDER — METOPROLOL TARTRATE 1 MG/ML IV SOLN
5.0000 mg | INTRAVENOUS | Status: DC | PRN
  Administered 2013-03-08: 5 mg via INTRAVENOUS

## 2013-03-08 MED ORDER — MIDAZOLAM HCL 5 MG/5ML IJ SOLN
INTRAMUSCULAR | Status: AC
  Filled 2013-03-08: qty 5

## 2013-03-08 MED ORDER — ACETAMINOPHEN 325 MG PO TABS: 650.0000 mg | ORAL_TABLET | ORAL | Status: DC | PRN

## 2013-03-08 MED ORDER — METOPROLOL SUCCINATE ER 25 MG PO TB24
25.0000 mg | ORAL_TABLET | Freq: Every day | ORAL | Status: DC
  Administered 2013-03-08 – 2013-03-09 (×2): 25 mg via ORAL
  Filled 2013-03-08 (×2): qty 1

## 2013-03-08 MED ORDER — SODIUM CHLORIDE 0.9 % IJ SOLN: 3.0000 mL | INTRAMUSCULAR | Status: DC | PRN

## 2013-03-08 MED ORDER — SODIUM CHLORIDE 0.9 % IJ SOLN
3.0000 mL | Freq: Two times a day (BID) | INTRAMUSCULAR | Status: DC
  Administered 2013-03-08: 3 mL via INTRAVENOUS

## 2013-03-08 MED ORDER — FENTANYL CITRATE 0.05 MG/ML IJ SOLN
INTRAMUSCULAR | Status: AC
  Filled 2013-03-08: qty 2

## 2013-03-08 MED ORDER — METOPROLOL TARTRATE 1 MG/ML IV SOLN
INTRAVENOUS | Status: AC
  Filled 2013-03-08: qty 5

## 2013-03-08 MED ORDER — SODIUM CHLORIDE 0.9 % IV SOLN: 250.0000 mL | INTRAVENOUS | Status: DC | PRN

## 2013-03-08 MED ORDER — ONDANSETRON HCL 4 MG/2ML IJ SOLN: 4.0000 mg | Freq: Four times a day (QID) | INTRAMUSCULAR | Status: DC | PRN

## 2013-03-08 NOTE — H&P (View-Only) (Signed)
    HPI Nicholas Robertson is referred today by Dr. Blyth for evaluation of Wolff-Parkinson-White syndrome. 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 He has a history of hypertension. He has remote history of tachycardia palpitations lasting minutes. He was placed on antihypertensive medications, metoprolol, several weeks ago. He experienced an episode of prolonged palpitations lasting over 5 minutes. He was placed on a monitor in his physician's office and was found to have tachycardia at a rate of 220 beats per minute. Unfortunately, we do not have a 12-lead electrocardiogram of the tachycardia but it was witnessed in the physician's office. Subsequent electrocardiogram demonstrated sinus rhythm with marked ventricular preexcitation consistent with WPW syndrome. Review of the patient's electrograms suggest that he has a right free wall accessory pathway. During tachycardia, he feels palpitations and a little bit lightheaded. No syncope, no chest pain, and minimal shortness of breath. He is diaphoretic. He is referred now to consider catheter ablation.  No Known Allergies   Current Outpatient Prescriptions  Medication Sig Dispense Refill  . metoprolol succinate (TOPROL XL) 25 MG 24 hr tablet Take 1 tablet (25 mg total) by mouth daily.  30 tablet  1   No current facility-administered medications for this visit.     Past Medical History  Diagnosis Date  . Strep pharyngitis 10/20/2012  . HTN (hypertension) 02/07/2013  . Other and unspecified hyperlipidemia 02/07/2013  . Abnormal LFTs 02/07/2013  . 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   History reviewed. No pertinent past surgical history.   Family History  Problem Relation Age of Onset  . Hypertension Mother   . Hyperlipidemia Mother   . Diabetes Mother     pre  . Mental illness Mother     anxiety  . Diabetes Father   .  Hypertension Father   . COPD Maternal Grandmother     former smoker  . Heart disease Maternal Grandfather   . Hyperlipidemia Maternal Grandfather   . Diabetes Maternal Grandfather     type 1  . Hearing loss Maternal Grandfather     cabg  . Hypertension Paternal Grandmother   . Psoriasis Paternal Grandmother   . Heart disease Paternal Grandfather     s/p MI  . Hyperlipidemia Paternal Grandfather   . Hypertension Paternal Grandfather   . Polymyalgia rheumatica Paternal Grandfather   . Diabetes Paternal Grandfather      History   Social History  . Marital Status: Single    Spouse Name: N/A    Number of Children: N/A  . Years of Education: N/A   Occupational History  . Not on file.   Social History Main Topics  . Smoking status: Never Smoker   . Smokeless tobacco: Never Used  . Alcohol Use: No  . Drug Use: No  . Sexual Activity: Not on file     Comment: no dietary restricitons, lives alone   Other Topics Concern  . Not on file   Social History Narrative  . No narrative on file     BP 140/90  Pulse 85  Ht 5' 10" (1.778 m)  Wt 221 lb (100.245 kg)  BMI 31.71 kg/m2  Physical Exam:  Well appearing young man, NAD HEENT: Unremarkable Neck:  No JVD, no thyromegally Back:  No CVA tenderness Lungs:  Clear with no wheezes, rales, or rhonchi. HEART:  Regular rate rhythm, no murmurs, no rubs,   no clicks Abd:  Soft, positive bowel sounds, no organomegally, no rebound, no guarding Ext:  2 plus pulses, no edema, no cyanosis, no clubbing Skin:  No rashes no nodules Neuro:  CN II through XII intact, motor grossly intact  EKG - normal sinus rhythm with manifest ventricular preexcitation.   Assess/Plan:

## 2013-03-08 NOTE — CV Procedure (Signed)
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 [id number].

## 2013-03-08 NOTE — Interval H&P Note (Signed)
History and Physical Interval Note: since prior clinic visit, no change in the history, physical exam, assessment and plan.   03/08/2013 7:31 AM  Nicholas Robertson  has presented today for surgery, with the diagnosis of WPW  The various methods of treatment have been discussed with the patient and family. After consideration of risks, benefits and other options for treatment, the patient has consented to  Procedure(s): WPW ABLATION (N/A) as a surgical intervention .  The patient's history has been reviewed, patient examined, no change in status, stable for surgery.  I have reviewed the patient's chart and labs.  Questions were answered to the patient's satisfaction.     Mikle Bosworth.D.

## 2013-03-08 NOTE — Progress Notes (Signed)
UR Completed Kathrynn Backstrom Graves-Bigelow, RN,BSN [phone]  

## 2013-03-09 DIAGNOSIS — I471 Supraventricular tachycardia: Secondary | ICD-10-CM

## 2013-03-09 HISTORY — PX: ABLATION: SHX5711

## 2013-03-09 NOTE — Discharge Summary (Signed)
ELECTROPHYSIOLOGY PROCEDURE DISCHARGE SUMMARY    Patient ID: Nicholas Robertson,  MRN: [id number], DOB/AGE: Nov 06, 1985 28 y.o.  Admit date: 03/08/2013 Discharge date: 03/09/2013  Primary Care Physician: Penni Homans, MD Electrophysiologist: Cristopher Peru, MD  Primary Discharge Diagnosis:  WPW status post ablation this admission  Secondary Discharge Diagnosis:  1.  Hypertension 2.  Hyperlipidemia  No Known Allergies   Procedures This Admission: 1.  Electrophysiology study and radiofrequency catheter ablation on 03-08-2013 by [name].  This demonstrated successful ablation of AVRT utilizing manifest right posterolateral accessory pathway. There were no early apparent complications.   Brief HPI: Nicholas Robertson is a 28 year old male with a past medical history of hypertension and tachypalpitations.  He was found to have WPW and was referred to [name] in the outpatient setting for treatment options.  Risks, benefits, and alternatives to EP study and ablation were reviewed with the patient who wished to proceed.   Hospital Course:  The patient was admitted and underwent EPS/RFCA of WPW with details as outlined above.  He was monitored on telemetry overnight which demonstrated sinus rhythm.  His groin and neck incisions were without complication.  He was evaluated by [name] and considered stable for discharge to home.  He will follow up with Ileene Hutchinson, PA in 4 weeks.   Discharge Vitals: Blood pressure 143/102, pulse 96, temperature 97.9 F (36.6 C), temperature source Oral, resp. rate 18, height 5\' 10"  (1.778 m), weight 220 lb (99.791 kg), SpO2 98.00%.    Labs:   Lab Results  Component Value Date   WBC 6.1 03/01/2013   HGB 16.0 03/01/2013   HCT 47.7 03/01/2013   MCV 94.5 03/01/2013   PLT 226.0 03/01/2013   No results found for this basename: NA, K, CL, CO2, BUN, CREATININE, CALCIUM, LABALBU, PROT, BILITOT, ALKPHOS, ALT, AST, GLUCOSE,  in the last 168 hours No results found for  this basename: CKTOTAL,  CKMB,  CKMBINDEX,  TROPONINI      Discharge Medications:    Medication List         acetaminophen 325 MG tablet  Commonly known as:  TYLENOL  Take 650 mg by mouth every 6 (six) hours as needed for mild pain or headache.     clobetasol cream 0.05 %  Commonly known as:  TEMOVATE  Apply 1 application topically daily as needed (rash).     KRILL OIL PO  Take 1 tablet by mouth daily.     metoprolol succinate 25 MG 24 hr tablet  Commonly known as:  TOPROL XL  Take 1 tablet (25 mg total) by mouth daily.     mupirocin ointment 2 %  Commonly known as:  BACTROBAN  Place 1 application into the nose 2 (two) times daily.        Disposition:  Discharge Orders   Future Appointments Provider Department Dept Phone   04/06/2013 9:00 AM Azzie Roup Deno Lunger [place] Office [phone]   04/13/2013 10:30 AM Mosie Lukes, MD [place] at  [facility] [phone]   Future Orders Complete By Expires   Diet - low sodium heart healthy  As directed    Increase activity slowly  As directed      Follow-up Information   Follow up with Ileene Hutchinson, PA-C On 04/06/2013. (At 9:00 AM for hospital follow-up (Dr. Tanna Furry PA))    Specialty:  Cardiology   Contact information:   [address] [place] [address]  [phone]       Duration of Discharge Encounter: Greater than 30 minutes including physician time.

## 2013-03-09 NOTE — Progress Notes (Signed)
Discharge instructions given.  No questions asked.  Left via walking with family.   Lorrene Reid

## 2013-03-09 NOTE — Op Note (Signed)
NAME:  Nicholas Robertson, Nicholas Robertson NO.:  [id number]  MEDICAL RECORD NO.:  [id number]  LOCATION:  [id number]                        FACILITY:  [facility]  PHYSICIAN:  Champ Mungo. 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  INTRODUCTION:  The patient is a 28 year old man who has a history of tachypalpitations, on low-dose beta-blocker therapy, he felt fatigued and now presents for catheter ablation of his SVT.  He has documented SVT at rates of up to 200 beats per minute.  PROCEDURE:  After informed consent was obtained, the patient was taken to the Diagnostic EP Lab in the fasting state.  After usual preparation and draping, intravenous fentanyl and midazolam were given for sedation. A 6-French hexapolar catheter was inserted percutaneously into the right jugular vein and advanced to the coronary sinus.  A 6-French quadripolar catheter was inserted percutaneously in the right femoral vein and advanced to the right ventricle.  A 6-French quadripolar catheter was inserted percutaneously in the right femoral vein and advanced to the His bundle region.  A 7-French 20-pole Halo catheter was inserted percutaneously in the right femoral vein and advanced to the right atrium.  With catheter insertion and manipulation, the patient would spontaneously go into SVT at 200 beats per minute.  Mapping demonstrated that the earliest atrial activation was along the tricuspid valve annulus at a site approximately 7 o'clock in the LAO projection. Attempt to place PVCs on His would not be carried out because the patient's tachycardia would spontaneously stopped just as we were about to place PVCs.  The VA time was prolonged at least compared to AV  node reentrant tachycardia and with the initiation of tachycardia, the patient's pre-excitation would resolve.  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  A 7-French quadripolar ablation catheter was then inserted percutaneously in the right femoral vein and advanced into the region of the tricuspid valve annulus.  Mapping was carried out. Earliest atrial activation during tachycardia was at a location approximately 7 o'clock in the LAO projection.  Multiple RF energy applications were then delivered to this site.  Unfortunately, the patient's pre-excitation would resolve, but then with discontinuation of RF energy application, pre-excitation would return typically within 2-3 minutes of the ablation.  At this point, a SR2 long sheath was inserted into the right atrium by way of the right femoral vein and additional mapping and ablation were carried out.  On multiple instances, RF would immediately terminate the patient's accessory pathway conduction, and always within a minute or two with RF termination, the patient's pre- excitation and inducible SVT would return.  At this point, we repositioned the Halo catheter much closer to the tricuspid valve annulus.  Previously, it did not fit the patient well and was backed further in the right atrium.  In addition, we removed the CS catheter and placed an 8-French sheath and advanced the 7-French quadripolar ablation catheter by way  of the right jugular vein under fluoroscopic guidance into the right atrium.  From the superior approach, mapping of the patient's accessory pathway was then carried out.  Catheter contact was much better, particularly to be able to map around the tricuspid valve annulus.  Additional mapping was carried out with ventricular pacing as well as during tachycardia as well as sinus rhythm.  Three additional RF energy applications were delivered  from the superior approach along the atrial insertion of the patient's right posterolateral accessory pathway.  Accessory pathway conduction was then terminated and the patient was observed for 15 minutes.  During this time, there was no re-initiation of accessory pathway conduction, and the patient was returned to his room in satisfactory condition.  Of note, the patient did have sinus tachycardia.  After resumption of the procedure and note he had no chest pain, we underwent bedside 2D echo, which demonstrated no significant pericardial effusion.  There was normal left ventricular function.  COMPLICATIONS:  There were no immediate procedure complications.  RESULTS:  A.  Baseline ECG:  Baseline ECG demonstrates sinus rhythm with manifest WPW syndrome. B.  Baseline intervals:  The sinus node cycle length was initially 500 milliseconds and after the termination of the case 120 milliseconds. C.  Baseline intervals.  The QRS duration was 150 milliseconds. Following ablation, the QRS duration was 90 milliseconds.  The HV interval was 20 milliseconds and postablation was 45 milliseconds. D.  Rapid ventricular pacing:  Rapid atrial pacing was carried out from the right ventricle demonstrating mesentery nondecremental atrial activation. E.  Rapid ventricular pacing:  Rapid ventricular pacing was carried out from the right ventricle at a pacing cycle of 400 milliseconds and stepwise decreased deep down to 300 milliseconds where VA block was demonstrated.  During rapid ventricular pacing, the atrial activation was eccentric and nondecremental. F.  Programmed ventricular stimulation:  Programmed ventricular stimulation was carried out from the right ventricle at base drive cycle length of 400 milliseconds.  The S1-S2 interval was stepwise decreased down to 280 milliseconds where the accessory pathway ERP was demonstrated. G.  Rapid atrial pacing:  Rapid atrial pacing was carried out from  the atrium at a base drive cycle length of 500 milliseconds and stepwise decreased down to 310 milliseconds where accessory pathway conduction was resolved and down to 290 milliseconds where AV Wenckebach was demonstrated. H.  Programmed atrial stimulation:  Programmed atrial stimulation was carried out from the atrium at a base drive cycle length of 500 milliseconds.  The S1-S2 interval was stepwise decreased down to 280 milliseconds resulting in the initiation of SVT, which was preceded by an AH jump.  He slow pathway conduction appeared to be important for initiation of the patient's tachycardia. 1. Arrhythmias observed.     a.     AV reentrant tachycardia.  Initiation was with programmed      atrial stimulation, and it was spontaneous.  The duration was      sustained, but would terminate spontaneously.  The cycle length      was approximately 300 milliseconds. 2. Mapping:  Mapping of the patient's accessory pathway conduction     demonstrated a right posterolateral accessory pathway. 3. RF energy application:  A total of 30 RF energy applications, some     of which were very short were applied to the ventricular as well as     atrial insertion of the pathway.  Ablating from the inferior     approach by way of the femoral vein  would resultant prompt     termination of accessory pathway conduction with prompt re-     initiation of accessory pathway conduction when the RF was     discontinued.  Ablation from the superior approach by way of the     right internal jugular vein, resulted in successful catheter     ablation.  CONCLUSIONS:  Study demonstrates successful, albeit very difficult electrophysiologic study and RF catheter ablation of a manifest right posterolateral accessory pathway.  75 RF energy applications were delivered before the patient's accessory pathway could be terminated and SVT no longer inducible.  The patient did have sinus tachycardia following  the procedure of uncertain clinical etiology.  A 2D echo was obtained demonstrated no significant pericardial effusion.     Champ Mungo. Lovena Le, MD     GWT/MEDQ  D:  03/08/2013  T:  03/09/2013  Job:  [id number]  cc:   Penni Homans, MD

## 2013-03-09 NOTE — Discharge Instructions (Signed)
No driving for 3 days. No lifting over 5 lbs for 1 week. No sexual activity for 1 week. Keep procedure site clean & dry. If you notice increased pain, swelling, bleeding or pus, call/return! You may shower, but no soaking baths/hot tubs/pools for 1 week. ° °

## 2013-03-09 NOTE — Progress Notes (Signed)
Patient ID: CHAMBERLAIN STEINBORN, male   DOB: [birth year], 28 y.o.   MRN: [id number]   Patient Name: Nicholas Robertson Date of Encounter: 03/09/2013     Active Problems:   Anomalous atrioventricular excitation    SUBJECTIVE Minimal incisional pain  CURRENT MEDS . metoprolol succinate  25 mg Oral Daily  . sodium chloride  3 mL Intravenous Q12H    OBJECTIVE  Filed Vitals:   03/08/13 1530 03/08/13 1730 03/08/13 2126 03/09/13 0334  BP: 132/92 139/89 144/87 143/102  Pulse:   98 96  Temp:   97.9 F (36.6 C) 97.9 F (36.6 C)  TempSrc:   Oral Oral  Resp:    18  Height:      Weight:      SpO2:   95% 98%   No intake or output data in the 24 hours ending 03/09/13 0842 Filed Weights   03/08/13 0545 03/08/13 1232  Weight: 220 lb (99.791 kg) 220 lb (99.791 kg)    PHYSICAL EXAM  General: Pleasant, NAD. Neuro: Alert and oriented X 3. Moves all extremities spontaneously. Psych: Normal affect. HEENT:  Normal  Neck: Supple without bruits or JVD. Lungs:  Resp regular and unlabored, CTA. Heart: RRR no s3, s4, or murmurs. Abdomen: Soft, non-tender, non-distended, BS + x 4.  Extremities: No clubbing, cyanosis or edema. DP/PT/Radials 2+ and equal bilaterally. No hematoma  Accessory Clinical Findings  CBC No results found for this basename: WBC, NEUTROABS, HGB, HCT, MCV, PLT,  in the last 72 hours Basic Metabolic Panel No results found for this basename: NA, K, CL, CO2, GLUCOSE, BUN, CREATININE, CALCIUM, MG, PHOS,  in the last 72 hours Liver Function Tests No results found for this basename: AST, ALT, ALKPHOS, BILITOT, PROT, ALBUMIN,  in the last 72 hours No results found for this basename: LIPASE, AMYLASE,  in the last 72 hours Cardiac Enzymes No results found for this basename: CKTOTAL, CKMB, CKMBINDEX, TROPONINI,  in the last 72 hours BNP No components found with this basename: POCBNP,  D-Dimer No results found for this basename: DDIMER,  in the last 72 hours Hemoglobin A1C No  results found for this basename: HGBA1C,  in the last 72 hours Fasting Lipid Panel No results found for this basename: CHOL, HDL, LDLCALC, TRIG, CHOLHDL, LDLDIRECT,  in the last 72 hours Thyroid Function Tests No results found for this basename: TSH, T4TOTAL, FREET3, T3FREE, THYROIDAB,  in the last 72 hours  TELE  NSR  ECG  NSR with no pre-excitation  Radiology/Studies  No results found.  ASSESSMENT AND PLAN  1. WPW syndrome 2. S/p EPS/RFA of AVRT utilizing a manifest right posterolateral AP, very difficult procedure with the ablation delivered from the superior approach through the right IJ 3.HTN - his blood pressure is up off of his beta blocker. I have asked him to restart the beta blocker and allow his primary MD to change his meds as appropriate.  Danilynn Jemison,M.D.  03/09/2013 8:42 AM

## 2013-03-16 ENCOUNTER — Encounter (HOSPITAL_COMMUNITY): Payer: Self-pay | Admitting: *Deleted

## 2013-03-24 ENCOUNTER — Other Ambulatory Visit: Payer: Self-pay | Admitting: Family Medicine

## 2013-03-24 DIAGNOSIS — I10 Essential (primary) hypertension: Secondary | ICD-10-CM

## 2013-03-24 MED ORDER — AMLODIPINE BESYLATE 5 MG PO TABS: 5.0000 mg | ORAL_TABLET | Freq: Every day | ORAL | Status: DC

## 2013-03-24 NOTE — Progress Notes (Signed)
Patient with fatigue on Metoprolol XR, improving some but will try Amlodipine 5 mg daily to assess whether it is better tolerated.

## 2013-04-06 ENCOUNTER — Encounter: Payer: Self-pay | Admitting: Cardiology

## 2013-04-06 ENCOUNTER — Ambulatory Visit (INDEPENDENT_AMBULATORY_CARE_PROVIDER_SITE_OTHER): Payer: 59 | Admitting: Cardiology

## 2013-04-06 VITALS — BP 120/86 | HR 97 | Ht 70.0 in | Wt 227.0 lb

## 2013-04-06 DIAGNOSIS — Z9889 Other specified postprocedural states: Secondary | ICD-10-CM

## 2013-04-06 DIAGNOSIS — I456 Pre-excitation syndrome: Secondary | ICD-10-CM

## 2013-04-06 DIAGNOSIS — R002 Palpitations: Secondary | ICD-10-CM

## 2013-04-06 DIAGNOSIS — Z8679 Personal history of other diseases of the circulatory system: Secondary | ICD-10-CM

## 2013-04-06 NOTE — Progress Notes (Signed)
ELECTROPHYSIOLOGY OFFICE NOTE  Patient ID: Nicholas Robertson MRN: [id number], DOB/AGE: 1985-03-09   Date of Visit: 04/06/2013  Primary Physician: Charlett Blake, MD Primary EP: Lovena Le, MD Reason for Visit: Hospital follow-up  History of Present Illness  Nicholas Robertson is a 28 y.o. male with WPW syndrome and PSVT who underwent EPS +RF ablation of AVRT utilizing right posterolateral accessory pathway on 03/08/2013. He presents today for routine electrophysiology followup.   Since the ablation procedure, he reports he is doing well and has no complaints. He denies recurrent palpitations. He denies chest pain or shortness of breath. He denies dizziness, near syncope or syncope. He denies LE swelling, orthopnea or PND. He reports his groin and neck sites have completely healed without difficulty. He has resumed his usual activities without limitation.  Past Medical History Past Medical History  Diagnosis Date  . Strep pharyngitis 10/20/2012  . HTN (hypertension)    . Other and unspecified hyperlipidemia    . Abnormal LFTs    . WPW (Wolff-Parkinson-White syndrome)      s/p ablation 02/2013 by [name]    Past Surgical History Past Surgical History  Procedure Laterality Date  . Ablation  03/09/2013    EPS and RFCA of manifest right posteroloateral accessory pathway    Allergies/Intolerances No Known Allergies  Current Home Medications Current Outpatient Prescriptions  Medication Sig Dispense Refill  . acetaminophen (TYLENOL) 325 MG tablet Take 650 mg by mouth every 6 (six) hours as needed for mild pain or headache.      Marland Kitchen amLODipine (NORVASC) 5 MG tablet Take 1 tablet (5 mg total) by mouth daily.  30 tablet  1  . clobetasol cream (TEMOVATE) 6.19 % Apply 1 application topically daily as needed (rash).      Marland Kitchen KRILL OIL PO Take 1 tablet by mouth daily.      . mupirocin ointment (BACTROBAN) 2 % Place 1 application into the nose 2 (two) times daily.  22 g  0   No current facility-administered  medications for this visit.    Social History History   Social History  . Marital Status: Single    Spouse Name: N/A    Number of Children: N/A  . Years of Education: N/A   Occupational History  . Not on file.   Social History Main Topics  . Smoking status: Never Smoker   . Smokeless tobacco: Never Used  . Alcohol Use: No  . Drug Use: No  . Sexual Activity: Not on file     Comment: no dietary restricitons, lives alone   Other Topics Concern  . Not on file   Social History Narrative  . No narrative on file     Review of Systems General: No chills, fever, night sweats or weight changes Cardiovascular: No chest pain, dyspnea on exertion, edema, orthopnea, palpitations, paroxysmal nocturnal dyspnea Dermatological: No rash, lesions or masses Respiratory: No cough, dyspnea Urologic: No hematuria, dysuria Abdominal: No nausea, vomiting, diarrhea, bright red blood per rectum, melena, or hematemesis Neurologic: No visual changes, weakness, changes in mental status All other systems reviewed and are otherwise negative except as noted above.  Physical Exam Vitals: Blood pressure 120/86, pulse 97, height 5\' 10"  (1.778 m), weight 227 lb (102.967 kg).  General: Well developed, well appearing 28 y.o. male in no acute distress. HEENT: Normocephalic, atraumatic. EOMs intact. Sclera nonicteric. Oropharynx clear.  Neck: Supple. No JVD. Lungs: Respirations regular and unlabored, CTA bilaterally. No wheezes, rales or rhonchi. Heart: RRR. S1, S2 present.  No murmurs, rub, S3 or S4. Abdomen: Soft, non-distended.  Extremities: No clubbing, cyanosis or edema.  Psych: Normal affect. Neuro: Alert and oriented X 3. Moves all extremities spontaneously.   Diagnostics 12-lead ECG today - NSR at 78 bpm without evidence of Delta wave or pre-excitation  Assessment and Plan WPW syndrome and PSVT s/p recent EPS +RF ablation of AVRT utilizing right posterolateral accessory pathway   [name] is  doing well post ablation. He denies any recurrent tachy-palpitations. His neck and groin sites are completely healed. He will follow-up with Dr. Lovena Le on an as needed basis.    Manson Passey, PA-C 04/06/2013, 11:43 PM

## 2013-04-06 NOTE — Patient Instructions (Addendum)
Your physician recommends that you schedule a follow-up appointment as needed with DR. Lovena Le  Your physician recommends that you continue on your current medications as directed. Please refer to the Current Medication list given to you today.

## 2013-04-13 ENCOUNTER — Encounter: Payer: Self-pay | Admitting: Family Medicine

## 2013-04-13 ENCOUNTER — Ambulatory Visit (INDEPENDENT_AMBULATORY_CARE_PROVIDER_SITE_OTHER): Payer: 59 | Admitting: Family Medicine

## 2013-04-13 VITALS — BP 140/82 | HR 94 | Temp 98.0°F | Ht 70.0 in | Wt 227.0 lb

## 2013-04-13 DIAGNOSIS — F419 Anxiety disorder, unspecified: Principal | ICD-10-CM

## 2013-04-13 DIAGNOSIS — F329 Major depressive disorder, single episode, unspecified: Secondary | ICD-10-CM

## 2013-04-13 DIAGNOSIS — R7989 Other specified abnormal findings of blood chemistry: Secondary | ICD-10-CM

## 2013-04-13 DIAGNOSIS — I456 Pre-excitation syndrome: Secondary | ICD-10-CM

## 2013-04-13 DIAGNOSIS — Z0000 Encounter for general adult medical examination without abnormal findings: Secondary | ICD-10-CM

## 2013-04-13 DIAGNOSIS — R945 Abnormal results of liver function studies: Secondary | ICD-10-CM

## 2013-04-13 DIAGNOSIS — E785 Hyperlipidemia, unspecified: Secondary | ICD-10-CM

## 2013-04-13 DIAGNOSIS — I10 Essential (primary) hypertension: Secondary | ICD-10-CM

## 2013-04-13 DIAGNOSIS — F341 Dysthymic disorder: Secondary | ICD-10-CM

## 2013-04-13 DIAGNOSIS — F32A Depression, unspecified: Secondary | ICD-10-CM

## 2013-04-13 MED ORDER — ESCITALOPRAM OXALATE 10 MG PO TABS: ORAL_TABLET | ORAL | Status: DC

## 2013-04-13 MED ORDER — AMLODIPINE BESYLATE 5 MG PO TABS: 5.0000 mg | ORAL_TABLET | Freq: Every day | ORAL | Status: DC

## 2013-04-13 NOTE — Patient Instructions (Signed)

## 2013-04-13 NOTE — Progress Notes (Signed)
Pre visit review using our clinic review tool, if applicable. No additional management support is needed unless otherwise documented below in the visit note. 

## 2013-04-14 ENCOUNTER — Telehealth: Payer: Self-pay | Admitting: Family Medicine

## 2013-04-14 NOTE — Telephone Encounter (Signed)
Relevant patient education assigned to patient using Emmi. ° °

## 2013-04-18 ENCOUNTER — Encounter: Payer: Self-pay | Admitting: Family Medicine

## 2013-04-18 DIAGNOSIS — F32A Depression, unspecified: Secondary | ICD-10-CM | POA: Insufficient documentation

## 2013-04-18 DIAGNOSIS — F329 Major depressive disorder, single episode, unspecified: Secondary | ICD-10-CM | POA: Insufficient documentation

## 2013-04-18 DIAGNOSIS — F419 Anxiety disorder, unspecified: Secondary | ICD-10-CM

## 2013-04-18 DIAGNOSIS — Z0000 Encounter for general adult medical examination without abnormal findings: Secondary | ICD-10-CM

## 2013-04-18 HISTORY — DX: Anxiety disorder, unspecified: F41.9

## 2013-04-18 HISTORY — DX: Encounter for general adult medical examination without abnormal findings: Z00.00

## 2013-04-18 HISTORY — DX: Depression, unspecified: F32.A

## 2013-04-18 NOTE — Progress Notes (Signed)
Patient ID: Nicholas Robertson, male   DOB: [birth year], 28 y.o.   MRN: [id number] Nicholas Robertson [id number] 1985-03-27 04/18/2013      Progress Note-Follow Up  Subjective  Chief Complaint  Chief Complaint  Patient presents with  . Annual Exam    physical    HPI  Patient is a 28 year old male in today for routine medical care. He has been recovering since his ablation for his Wolff-Parkinson-White. His fatigue is slowly improving. He notes his blood pressure at his cardiology visit the other day was 128/86. He's otherwise doing well. Does acknowledge significant stressors have made his anxiety increased. He is having episodes of low mood as well. No severe depression or suicidal ideation is noted. Denies CP/palp/SOB/HA/congestion/fevers/GI or GU c/o. Taking meds as prescribed  Past Medical History  Diagnosis Date  . Strep pharyngitis 10/20/2012  . HTN (hypertension)    . Other and unspecified hyperlipidemia    . Abnormal LFTs    . WPW (Wolff-Parkinson-White syndrome)      s/p ablation 02/2013 by [name]    Past Surgical History  Procedure Laterality Date  . Ablation  03/09/2013    EPS and RFCA of manifest right posteroloateral accessory pathway    Family History  Problem Relation Age of Onset  . Hypertension Mother   . Hyperlipidemia Mother   . Diabetes Mother     pre  . Mental illness Mother     anxiety  . Diabetes Father   . Hypertension Father   . COPD Maternal Grandmother     former smoker  . Heart disease Maternal Grandfather   . Hyperlipidemia Maternal Grandfather   . Diabetes Maternal Grandfather     type 1  . Hearing loss Maternal Grandfather     cabg  . Hypertension Paternal Grandmother   . Psoriasis Paternal Grandmother   . Heart disease Paternal Grandfather     s/p MI  . Hyperlipidemia Paternal Grandfather   . Hypertension Paternal Grandfather   . Polymyalgia rheumatica Paternal Grandfather   . Diabetes Paternal Grandfather     History   Social  History  . Marital Status: Single    Spouse Name: N/A    Number of Children: N/A  . Years of Education: N/A   Occupational History  . Not on file.   Social History Main Topics  . Smoking status: Never Smoker   . Smokeless tobacco: Never Used  . Alcohol Use: No  . Drug Use: No  . Sexual Activity: Not on file     Comment: no dietary restricitons, lives alone,   Other Topics Concern  . Not on file   Social History Narrative  . No narrative on file    Current Outpatient Prescriptions on File Prior to Visit  Medication Sig Dispense Refill  . clobetasol cream (TEMOVATE) 6.44 % Apply 1 application topically daily as needed (rash).      Marland Kitchen KRILL OIL PO Take 1 tablet by mouth daily.       No current facility-administered medications on file prior to visit.    No Known Allergies  Review of Systems  Review of Systems  Constitutional: Negative for fever and malaise/fatigue.  HENT: Negative for congestion.   Eyes: Negative for discharge.  Respiratory: Negative for shortness of breath.   Cardiovascular: Negative for chest pain, palpitations and leg swelling.  Gastrointestinal: Negative for nausea, abdominal pain and diarrhea.  Genitourinary: Negative for dysuria.  Musculoskeletal: Negative for falls.  Skin: Negative for rash.  Neurological: Negative for loss of consciousness and headaches.  Endo/Heme/Allergies: Negative for polydipsia.  Psychiatric/Behavioral: Positive for depression. Negative for suicidal ideas. The patient is nervous/anxious. The patient does not have insomnia.     Objective  BP 140/82  Pulse 94  Temp(Src) 98 F (36.7 C) (Oral)  Ht 5\' 10"  (1.778 m)  Wt 227 lb (102.967 kg)  BMI 32.57 kg/m2  SpO2 97%  Physical Exam  Physical Exam  Constitutional: He is oriented to person, place, and time and well-developed, well-nourished, and in no distress. No distress.  HENT:  Head: Normocephalic and atraumatic.  Eyes: Conjunctivae are normal.  Neck: Neck  supple. No thyromegaly present.  Cardiovascular: Normal rate, regular rhythm and normal heart sounds.   No murmur heard. Pulmonary/Chest: Effort normal and breath sounds normal. No respiratory distress.  Abdominal: He exhibits no distension and no mass. There is no tenderness.  Musculoskeletal: He exhibits no edema.  Neurological: He is alert and oriented to person, place, and time.  Skin: Skin is warm.  Psychiatric: Memory, affect and judgment normal.    Lab Results  Component Value Date   TSH 2.442 02/04/2013   Lab Results  Component Value Date   WBC 6.1 03/01/2013   HGB 16.0 03/01/2013   HCT 47.7 03/01/2013   MCV 94.5 03/01/2013   PLT 226.0 03/01/2013   Lab Results  Component Value Date   CREATININE 0.9 03/01/2013   BUN 11 03/01/2013   NA 140 03/01/2013   K 4.2 03/01/2013   CL 105 03/01/2013   CO2 29 03/01/2013   Lab Results  Component Value Date   ALT 68* 02/04/2013   AST 35 02/04/2013   ALKPHOS 46 02/04/2013   BILITOT 0.7 02/04/2013   Lab Results  Component Value Date   CHOL 231* 02/04/2013   Lab Results  Component Value Date   HDL 49 02/04/2013   Lab Results  Component Value Date   LDLCALC 159* 02/04/2013   Lab Results  Component Value Date   TRIG 115 02/04/2013   Lab Results  Component Value Date   CHOLHDL 4.7 02/04/2013     Assessment & Plan  Wolff-Parkinson-White (WPW) syndrome Doing well as he recovers from ablation.  HTN (hypertension) Improved control since switching to Amlodipine  Abnormal LFTs Encouraged heart healthy diet, increase exercise, avoid trans fats, consider a krill oil cap daily  Other and unspecified hyperlipidemia Encouraged heart healthy diet, increase exercise, avoid trans fats, consider a krill oil cap daily  Anxiety and depression Will start Escitalopram and reevaluate at next visit or as needed

## 2013-04-18 NOTE — Assessment & Plan Note (Signed)
Encouraged heart healthy diet, increase exercise, avoid trans fats, consider a krill oil cap daily 

## 2013-04-18 NOTE — Assessment & Plan Note (Signed)
Will start Escitalopram and reevaluate at next visit or as needed

## 2013-04-18 NOTE — Assessment & Plan Note (Signed)
Patient encouraged to maintain heart healthy diet, regular exercise, adequate sleep. Consider daily probiotics. Take medications as prescribed 

## 2013-04-18 NOTE — Assessment & Plan Note (Signed)
Improved control since switching to Amlodipine

## 2013-04-18 NOTE — Assessment & Plan Note (Signed)
Doing well as he recovers from ablation.

## 2013-07-07 ENCOUNTER — Other Ambulatory Visit: Payer: Self-pay

## 2013-07-07 MED ORDER — CEPHALEXIN 500 MG PO CAPS: 500.0000 mg | ORAL_CAPSULE | Freq: Three times a day (TID) | ORAL | Status: DC

## 2013-07-07 NOTE — Telephone Encounter (Signed)
Per md pt needs keflex 500 tid X 7 days

## 2013-07-09 ENCOUNTER — Other Ambulatory Visit: Payer: Self-pay | Admitting: Family Medicine

## 2013-07-09 DIAGNOSIS — I889 Nonspecific lymphadenitis, unspecified: Secondary | ICD-10-CM

## 2013-07-09 MED ORDER — CLINDAMYCIN HCL 300 MG PO CAPS: 300.0000 mg | ORAL_CAPSULE | Freq: Three times a day (TID) | ORAL | Status: DC

## 2013-07-09 NOTE — Progress Notes (Signed)
Persistent and painful lymphadenitis noted along righ SCM with irritated bug bite noted on scalp caudal to this. Improving some but will switch to Clindamycin due to slow response

## 2013-07-14 ENCOUNTER — Telehealth: Payer: Self-pay | Admitting: Family Medicine

## 2013-07-14 ENCOUNTER — Other Ambulatory Visit: Payer: Self-pay | Admitting: Family Medicine

## 2013-07-14 DIAGNOSIS — R509 Fever, unspecified: Secondary | ICD-10-CM

## 2013-07-14 LAB — CBC
HEMATOCRIT: 45.7 % (ref 39.0–52.0)
Hemoglobin: 16.5 g/dL (ref 13.0–17.0)
MCH: 31.4 pg (ref 26.0–34.0)
MCHC: 36.1 g/dL — ABNORMAL HIGH (ref 30.0–36.0)
MCV: 87 fL (ref 78.0–100.0)
Platelets: 209 10*3/uL (ref 150–400)
RBC: 5.25 MIL/uL (ref 4.22–5.81)
RDW: 13.1 % (ref 11.5–15.5)
WBC: 6.1 10*3/uL (ref 4.0–10.5)

## 2013-07-14 NOTE — Telephone Encounter (Signed)
Ordered CBC to evaluate fever

## 2013-07-19 ENCOUNTER — Encounter: Payer: Self-pay | Admitting: Family

## 2013-07-19 ENCOUNTER — Emergency Department (HOSPITAL_BASED_OUTPATIENT_CLINIC_OR_DEPARTMENT_OTHER): Payer: 59

## 2013-07-19 ENCOUNTER — Ambulatory Visit (INDEPENDENT_AMBULATORY_CARE_PROVIDER_SITE_OTHER): Payer: 59 | Admitting: Family

## 2013-07-19 ENCOUNTER — Emergency Department (HOSPITAL_BASED_OUTPATIENT_CLINIC_OR_DEPARTMENT_OTHER)
Admission: EM | Admit: 2013-07-19 | Discharge: 2013-07-19 | Disposition: A | Discharge status: Home / Self Care | Payer: 59 | Attending: Emergency Medicine | Admitting: Emergency Medicine

## 2013-07-19 ENCOUNTER — Encounter (HOSPITAL_BASED_OUTPATIENT_CLINIC_OR_DEPARTMENT_OTHER): Payer: Self-pay | Admitting: Emergency Medicine

## 2013-07-19 ENCOUNTER — Other Ambulatory Visit: Payer: Self-pay | Admitting: Family

## 2013-07-19 VITALS — BP 140/102 | HR 132 | Temp 97.5°F | Ht 70.0 in | Wt 210.0 lb

## 2013-07-19 DIAGNOSIS — I10 Essential (primary) hypertension: Secondary | ICD-10-CM

## 2013-07-19 DIAGNOSIS — R82998 Other abnormal findings in urine: Secondary | ICD-10-CM

## 2013-07-19 DIAGNOSIS — E860 Dehydration: Secondary | ICD-10-CM | POA: Insufficient documentation

## 2013-07-19 DIAGNOSIS — Z79899 Other long term (current) drug therapy: Secondary | ICD-10-CM | POA: Insufficient documentation

## 2013-07-19 DIAGNOSIS — R509 Fever, unspecified: Secondary | ICD-10-CM | POA: Insufficient documentation

## 2013-07-19 DIAGNOSIS — I472 Ventricular tachycardia: Secondary | ICD-10-CM

## 2013-07-19 DIAGNOSIS — R000 Tachycardia, unspecified: Secondary | ICD-10-CM

## 2013-07-19 DIAGNOSIS — I456 Pre-excitation syndrome: Secondary | ICD-10-CM | POA: Insufficient documentation

## 2013-07-19 DIAGNOSIS — Z8619 Personal history of other infectious and parasitic diseases: Secondary | ICD-10-CM | POA: Insufficient documentation

## 2013-07-19 DIAGNOSIS — R8290 Unspecified abnormal findings in urine: Secondary | ICD-10-CM

## 2013-07-19 DIAGNOSIS — I4729 Other ventricular tachycardia: Secondary | ICD-10-CM

## 2013-07-19 DIAGNOSIS — R634 Abnormal weight loss: Secondary | ICD-10-CM

## 2013-07-19 DIAGNOSIS — Z792 Long term (current) use of antibiotics: Secondary | ICD-10-CM | POA: Insufficient documentation

## 2013-07-19 DIAGNOSIS — M545 Low back pain, unspecified: Secondary | ICD-10-CM

## 2013-07-19 DIAGNOSIS — R7989 Other specified abnormal findings of blood chemistry: Secondary | ICD-10-CM

## 2013-07-19 DIAGNOSIS — Z0000 Encounter for general adult medical examination without abnormal findings: Secondary | ICD-10-CM

## 2013-07-19 DIAGNOSIS — J029 Acute pharyngitis, unspecified: Secondary | ICD-10-CM

## 2013-07-19 LAB — CBC WITH DIFFERENTIAL/PLATELET
Band Neutrophils: 4 % (ref 0–10)
Basophils Absolute: 0 10*3/uL (ref 0.0–0.1)
Basophils Relative: 0 % (ref 0–1)
EOS PCT: 0 % (ref 0–5)
Eosinophils Absolute: 0 10*3/uL (ref 0.0–0.7)
HCT: 43.6 % (ref 39.0–52.0)
Hemoglobin: 15.4 g/dL (ref 13.0–17.0)
LYMPHS ABS: 1.9 10*3/uL (ref 0.7–4.0)
Lymphocytes Relative: 32 % (ref 12–46)
MCH: 31.2 pg (ref 26.0–34.0)
MCHC: 35.3 g/dL (ref 30.0–36.0)
MCV: 88.4 fL (ref 78.0–100.0)
MONO ABS: 0.5 10*3/uL (ref 0.1–1.0)
Monocytes Relative: 8 % (ref 3–12)
NEUTROS ABS: 3.4 10*3/uL (ref 1.7–7.7)
NEUTROS PCT: 56 % (ref 43–77)
Platelets: 188 10*3/uL (ref 150–400)
RBC: 4.93 MIL/uL (ref 4.22–5.81)
RDW: 12 % (ref 11.5–15.5)
WBC: 5.8 10*3/uL (ref 4.0–10.5)

## 2013-07-19 LAB — COMPREHENSIVE METABOLIC PANEL
ALBUMIN: 4.2 g/dL (ref 3.5–5.2)
ALT: 47 U/L (ref 0–53)
AST: 31 U/L (ref 0–37)
Alkaline Phosphatase: 72 U/L (ref 39–117)
BUN: 8 mg/dL (ref 6–23)
CO2: 22 mEq/L (ref 19–32)
CREATININE: 1 mg/dL (ref 0.50–1.35)
Calcium: 9.4 mg/dL (ref 8.4–10.5)
Chloride: 98 mEq/L (ref 96–112)
GFR calc Af Amer: >90 mL/min (ref >90)
Glucose, Bld: 109 mg/dL — ABNORMAL HIGH (ref 70–99)
POTASSIUM: 3.6 meq/L — AB (ref 3.7–5.3)
Sodium: 136 mEq/L — ABNORMAL LOW (ref 137–147)
Total Bilirubin: 0.8 mg/dL (ref 0.3–1.2)
Total Protein: 7.6 g/dL (ref 6.0–8.3)

## 2013-07-19 LAB — POCT URINALYSIS DIPSTICK
Blood, UA: NEGATIVE
GLUCOSE UA: NEGATIVE
Leukocytes, UA: NEGATIVE
Nitrite, UA: NEGATIVE
Urobilinogen, UA: 0.2
pH, UA: 6

## 2013-07-19 LAB — POCT RAPID STREP A (OFFICE): Rapid Strep A Screen: NEGATIVE

## 2013-07-19 MED ORDER — DOXYCYCLINE HYCLATE 100 MG PO CAPS: 100.0000 mg | ORAL_CAPSULE | Freq: Two times a day (BID) | ORAL | Status: DC

## 2013-07-19 MED ORDER — SODIUM CHLORIDE 0.9 % IV BOLUS (SEPSIS)
2000.0000 mL | Freq: Once | INTRAVENOUS | Status: AC
  Administered 2013-07-19: 2000 mL via INTRAVENOUS

## 2013-07-19 MED ORDER — SODIUM CHLORIDE 0.9 % IV BOLUS (SEPSIS)
1000.0000 mL | Freq: Once | INTRAVENOUS | Status: AC
  Administered 2013-07-19: 1000 mL via INTRAVENOUS

## 2013-07-19 NOTE — ED Provider Notes (Signed)
CSN: [id number]     Arrival date & time 07/19/13  1216 History   First MD Initiated Contact with Patient 07/19/13 1235     Chief Complaint  Patient presents with  . Fatigue      HPI Patient presents with one-week history of fevers mostly at night associated with general fatigue and malaise.  Has recently been treated with clindamycin for a "" possible spider bite to scalp.  This was followed by some lymphadenopathy in his neck which has since resolved.  Patient denies any vomiting or diarrhea.  Has a past medical history Wolff-Parkinson-White syndrome. Past Medical History  Diagnosis Date  . Strep pharyngitis 10/20/2012  . HTN (hypertension)    . Other and unspecified hyperlipidemia    . Abnormal LFTs    . WPW (Wolff-Parkinson-White syndrome)      s/p ablation 02/2013 by [name]  . Preventative health care 04/18/2013   Past Surgical History  Procedure Laterality Date  . Ablation  03/09/2013    EPS and RFCA of manifest right posteroloateral accessory pathway   Family History  Problem Relation Age of Onset  . Hypertension Mother   . Hyperlipidemia Mother   . Diabetes Mother     pre  . Mental illness Mother     anxiety  . Diabetes Father   . Hypertension Father   . COPD Maternal Grandmother     former smoker  . Heart disease Maternal Grandfather   . Hyperlipidemia Maternal Grandfather   . Diabetes Maternal Grandfather     type 1  . Hearing loss Maternal Grandfather     cabg  . Hypertension Paternal Grandmother   . Psoriasis Paternal Grandmother   . Heart disease Paternal Grandfather     s/p MI  . Hyperlipidemia Paternal Grandfather   . Hypertension Paternal Grandfather   . Polymyalgia rheumatica Paternal Grandfather   . Diabetes Paternal Grandfather    History  Substance Use Topics  . Smoking status: Never Smoker   . Smokeless tobacco: Never Used  . Alcohol Use: No    Review of Systems All other systems reviewed and are negative   Allergies  Review of  patient's allergies indicates no known allergies.  Home Medications   Prior to Admission medications   Medication Sig Start Date End Date Taking? Authorizing Provider  ALPRAZolam (XANAX) 0.25 MG tablet Take 1 tablet (0.25 mg total) by mouth 2 (two) times daily as needed for anxiety. 07/23/13   Mosie Lukes, MD  amLODipine (NORVASC) 5 MG tablet Take 1 tablet (5 mg total) by mouth daily. 04/13/13   Mosie Lukes, MD  clindamycin (CLEOCIN) 300 MG capsule Take 1 capsule (300 mg total) by mouth 3 (three) times daily. 07/09/13   Mosie Lukes, MD  doxycycline (VIBRAMYCIN) 100 MG capsule Take 1 capsule (100 mg total) by mouth 2 (two) times daily. 07/19/13   Dot Lanes, MD  escitalopram (LEXAPRO) 10 MG tablet 1/2 tab po daily x 7 days and 1 tab po daily 04/13/13   Mosie Lukes, MD  KRILL OIL PO Take 1 tablet by mouth daily.    Historical Provider, MD   BP 121/81  Pulse 116  Temp(Src) 98.5 F (36.9 C) (Oral)  Resp 16  Ht 5\' 10"  (1.778 m)  Wt 210 lb (95.255 kg)  BMI 30.13 kg/m2  SpO2 99% Physical Exam  Nursing note and vitals reviewed. Constitutional: He is oriented to person, place, and time. He appears well-developed and well-nourished. No distress.  HENT:  Head: Normocephalic and atraumatic.  Eyes: Pupils are equal, round, and reactive to light.  Neck: Normal range of motion.  Cardiovascular: Normal rate and intact distal pulses.   Pulmonary/Chest: No respiratory distress.  Abdominal: Normal appearance. He exhibits no distension. There is no tenderness. There is no rebound and no guarding.  Musculoskeletal: Normal range of motion.  Lymphadenopathy:       Head (right side): No submandibular, no posterior auricular and no occipital adenopathy present.       Head (left side): No submandibular, no posterior auricular and no occipital adenopathy present.    He has no cervical adenopathy.  Neurological: He is alert and oriented to person, place, and time. No cranial nerve deficit.   Skin: Skin is warm and dry. No rash noted.  Psychiatric: He has a normal mood and affect. His behavior is normal.    ED Course  Procedures (including critical care time) Medications  sodium chloride 0.9 % bolus 2,000 mL (0 mLs Intravenous Stopped 07/19/13 1529)  sodium chloride 0.9 % bolus 1,000 mL (0 mLs Intravenous Stopped 07/19/13 1532)    Labs Review Labs Reviewed  COMPREHENSIVE METABOLIC PANEL - Abnormal; Notable for the following:    Sodium 136 (*)    Potassium 3.6 (*)    Glucose, Bld 109 (*)    All other components within normal limits  B. BURGDORFI ANTIBODIES - Abnormal; Notable for the following:    B burgdorferi Ab IgG+IgM 0.91 (*)    All other components within normal limits  CBC WITH DIFFERENTIAL  ROCKY MTN SPOTTED FVR AB, IGG-BLOOD  ROCKY MTN SPOTTED FVR AB, IGM-BLOOD  B. BURGDORFI ANTIBODIES BY WB    Imaging Review No results found.   EKG Interpretation   Date/Time:  Monday July 19 2013 13:19:31 EDT Ventricular Rate:  134 PR Interval:  154 QRS Duration: 84 QT Interval:  278 QTC Calculation: 415 R Axis:   -17 Text Interpretation:  Sinus tachycardia Cannot rule out Inferior infarct ,  age undetermined Cannot rule out Anteroseptal infarct , age undetermined  Abnormal ECG No previous tracing Confirmed by Audie Pinto  MD, Meya Clutter [phone])  on 07/19/2013 2:12:51 PM      MDM   Final diagnoses:  Fever, unspecified fever cause  Dehydration        Dot Lanes, MD 07/25/13 Joen Laura

## 2013-07-19 NOTE — Progress Notes (Signed)
Pre visit review using our clinic review tool, if applicable. No additional management support is needed unless otherwise documented below in the visit note. 

## 2013-07-19 NOTE — ED Notes (Signed)
Pt reports one week of malaise, fevers at night, today with worsening feeling and now lower back pain. Labs drawn upstairs, here for fluids.

## 2013-07-19 NOTE — Assessment & Plan Note (Signed)
Uncontrolled. Will plan to continue amlodipine at new, increased dose of 10 mg.

## 2013-07-19 NOTE — Assessment & Plan Note (Addendum)
Recent fever. Etiology is unclear.  Suspect mild dehydration in setting of viral illness. We did swab his throat for strep- rapid is negative.  UA notes + protein.  Will need to be repeated when he is feeling better.  Will send urine for culture. Will send HIV screening/HIV RNA at pt request.  Pt wishes to proceed to ED for IV fluids and further evaluation which I think is reasonable.

## 2013-07-19 NOTE — Progress Notes (Signed)
Subjective:    Patient ID: Nicholas Robertson, male    DOB: 17-May-1985, 28 y.o.   MRN: [id number]  HPI  [name] is a 28 yr old male with hx of WPW s/p ablation who presents today with several concerns:  HTN-  Reports that he usually takes 5 mg of amlodipine. He took a second 5mg  tab today for a total of 10mg    He recently had some right sided cervical LAD and Posterior occipital LAD about 1.5 weeks ago. He had a small lesion on his scalp (pimple- like). He was treated with clindamycin by Dr. Charlett Blake and notes resolution of the scalp lesion and LAD.  He does reports feeling thirsty.  Notes mild malaise, mild anorexia. Nighttime fever started last Tuesday,then chills, 101.  Resolved during the day. Thursday ok, night- low grade fever.  Wt Readings from Last 3 Encounters:  07/19/13 210 lb (95.255 kg)  07/19/13 210 lb (95.255 kg)  04/13/13 227 lb (102.967 kg)      Review of Systems See HPI  Past Medical History  Diagnosis Date  . Strep pharyngitis 10/20/2012  . HTN (hypertension)    . Other and unspecified hyperlipidemia    . Abnormal LFTs    . WPW (Wolff-Parkinson-White syndrome)      s/p ablation 02/2013 by [name]  . Preventative health care 04/18/2013    History   Social History  . Marital Status: Single    Spouse Name: N/A    Number of Children: N/A  . Years of Education: N/A   Occupational History  . Not on file.   Social History Main Topics  . Smoking status: Never Smoker   . Smokeless tobacco: Never Used  . Alcohol Use: No  . Drug Use: No  . Sexual Activity: Yes     Comment: no dietary restricitons, lives alone,   Other Topics Concern  . Not on file   Social History Narrative  . No narrative on file    Past Surgical History  Procedure Laterality Date  . Ablation  03/09/2013    EPS and RFCA of manifest right posteroloateral accessory pathway    Family History  Problem Relation Age of Onset  . Hypertension Mother   . Hyperlipidemia Mother   .  Diabetes Mother     pre  . Mental illness Mother     anxiety  . Diabetes Father   . Hypertension Father   . COPD Maternal Grandmother     former smoker  . Heart disease Maternal Grandfather   . Hyperlipidemia Maternal Grandfather   . Diabetes Maternal Grandfather     type 1  . Hearing loss Maternal Grandfather     cabg  . Hypertension Paternal Grandmother   . Psoriasis Paternal Grandmother   . Heart disease Paternal Grandfather     s/p MI  . Hyperlipidemia Paternal Grandfather   . Hypertension Paternal Grandfather   . Polymyalgia rheumatica Paternal Grandfather   . Diabetes Paternal Grandfather     No Known Allergies  Current Outpatient Prescriptions on File Prior to Visit  Medication Sig Dispense Refill  . amLODipine (NORVASC) 5 MG tablet Take 1 tablet (5 mg total) by mouth daily.  90 tablet  1  . clindamycin (CLEOCIN) 300 MG capsule Take 1 capsule (300 mg total) by mouth 3 (three) times daily.  30 capsule  0  . escitalopram (LEXAPRO) 10 MG tablet 1/2 tab po daily x 7 days and 1 tab po daily  30 tablet  3  . KRILL OIL PO Take 1 tablet by mouth daily.       No current facility-administered medications on file prior to visit.    BP 140/102  Pulse 132  Temp(Src) 97.5 F (36.4 C) (Oral)  Ht 5\' 10"  (1.778 m)  Wt 210 lb (95.255 kg)  BMI 30.13 kg/m2  SpO2 98%       Objective:   Physical Exam  Constitutional: He is oriented to person, place, and time. He appears well-developed. No distress.  Tired appearing  HENT:  Head: Normocephalic and atraumatic.  Right Ear: Tympanic membrane and ear canal normal.  Left Ear: Tympanic membrane and ear canal normal.  Mouth/Throat: No oropharyngeal exudate, posterior oropharyngeal edema or posterior oropharyngeal erythema.  Neck:  Palpable anterior cervical LN- upper limit normal in size.   Cardiovascular: Regular rhythm.  Tachycardia present.   Musculoskeletal: He exhibits no edema.  Neurological: He is alert and oriented to  person, place, and time.  Psychiatric: He has a normal mood and affect. His behavior is normal. Judgment and thought content normal.          Assessment & Plan:

## 2013-07-19 NOTE — Assessment & Plan Note (Signed)
Reviewed EKG and case with Dr. Lovena Le' pt's EP specialist.  He does not feel that tachycardia is related to WPW but that it is completely unrelated.

## 2013-07-19 NOTE — Discharge Instructions (Signed)
Dehydration, Adult Dehydration means your body does not have as much fluid as it needs. Your kidneys, brain, and heart will not work properly without the right amount of fluids and salt.  HOME CARE  Ask your doctor how to replace body fluid losses (rehydrate).  Drink enough fluids to keep your pee (urine) clear or pale yellow.  Drink small amounts of fluids often if you feel sick to your stomach (nauseous) or throw up (vomit).  Eat like you normally do.  Avoid:  Foods or drinks high in sugar.  Bubbly (carbonated) drinks.  Juice.  Very hot or cold fluids.  Drinks with caffeine.  Fatty, greasy foods.  Alcohol.  Tobacco.  Eating too much.  Gelatin desserts.  Wash your hands to avoid spreading germs (bacteria, viruses).  Only take medicine as told by your doctor.  Keep all doctor visits as told. GET HELP RIGHT AWAY IF:   You cannot drink something without throwing up.  You get worse even with treatment.  Your vomit has blood in it or looks greenish.  Your poop (stool) has blood in it or looks black and tarry.  You have not peed in 6 to 8 hours.  You pee a small amount of very dark pee.  You have a fever.  You pass out (faint).  You have belly (abdominal) pain that gets worse or stays in one spot (localizes).  You have a rash, stiff neck, or bad headache.  You get easily annoyed, sleepy, or are hard to wake up.  You feel weak, dizzy, or very thirsty. MAKE SURE YOU:   Understand these instructions.  Will watch your condition.  Will get help right away if you are not doing well or get worse. Document Released: 11/10/2008 Document Revised: 04/08/2011 Document Reviewed: 09/03/2010 [facility] Patient Information 2015 [place], [place]. This information is not intended to replace advice given to you by your health care provider. Make sure you discuss any questions you have with your health care provider.  Fever, Adult A fever is a temperature of 100.4 F  (38 C) or above.  HOME CARE  Take fever medicine as told by your doctor. Do not  take aspirin for fever if you are younger than 28 years of age.  If you are given antibiotic medicine, take it as told. Finish the medicine even if you start to feel better.  Rest.  Drink enough fluids to keep your pee (urine) clear or pale yellow. Do not drink alcohol.  Take a bath or shower with room temperature water. Do not use ice water or alcohol sponge baths.  Wear lightweight, loose clothes. GET HELP RIGHT AWAY IF:   You are short of breath or have trouble breathing.  You are very weak.  You are dizzy or you pass out (faint).  You are very thirsty or are making little or no urine.  You have new pain.  You throw up (vomit) or have watery poop (diarrhea).  You keep throwing up or having watery poop for more than 1 to 2 days.  You have a stiff neck or light bothers your eyes.  You have a skin rash.  You have a fever or problems (symptoms) that last for more than 2 to 3 days.  You have a fever and your problems quickly get worse.  You keep throwing up the fluids you drink.  You do not feel better after 3 days.  You have new problems. MAKE SURE YOU:   Understand these instructions.  Will  watch your condition.  Will get help right away if you are not doing well or get worse. Document Released: 10/24/2007 Document Revised: 04/08/2011 Document Reviewed: 11/15/2010 [facility] Patient Information 2015 [place], [place]. This information is not intended to replace advice given to you by your health care provider. Make sure you discuss any questions you have with your health care provider.

## 2013-07-19 NOTE — Patient Instructions (Signed)
Please complete lab work. Call if symptoms worsen or if symptoms do not improve.

## 2013-07-20 ENCOUNTER — Telehealth: Payer: Self-pay | Admitting: Family

## 2013-07-20 ENCOUNTER — Encounter: Payer: Self-pay | Admitting: Family

## 2013-07-20 ENCOUNTER — Ambulatory Visit: Payer: 59 | Admitting: Family Medicine

## 2013-07-20 DIAGNOSIS — R197 Diarrhea, unspecified: Secondary | ICD-10-CM

## 2013-07-20 LAB — CBC WITH DIFFERENTIAL/PLATELET
BASOS PCT: 1 % (ref 0–1)
Basophils Absolute: 0.1 10*3/uL (ref 0.0–0.1)
EOS ABS: 0 10*3/uL (ref 0.0–0.7)
Eosinophils Relative: 0 % (ref 0–5)
HEMATOCRIT: 45.7 % (ref 39.0–52.0)
HEMOGLOBIN: 16.3 g/dL (ref 13.0–17.0)
Lymphocytes Relative: 30 % (ref 12–46)
Lymphs Abs: 1.7 10*3/uL (ref 0.7–4.0)
MCH: 31 pg (ref 26.0–34.0)
MCHC: 35.7 g/dL (ref 30.0–36.0)
MCV: 86.9 fL (ref 78.0–100.0)
MONO ABS: 0.7 10*3/uL (ref 0.1–1.0)
MONOS PCT: 13 % — AB (ref 3–12)
NEUTROS PCT: 56 % (ref 43–77)
Neutro Abs: 3.2 10*3/uL (ref 1.7–7.7)
Platelets: 210 10*3/uL (ref 150–400)
RBC: 5.26 MIL/uL (ref 4.22–5.81)
RDW: 13.2 % (ref 11.5–15.5)
WBC: 5.7 10*3/uL (ref 4.0–10.5)

## 2013-07-20 LAB — BASIC METABOLIC PANEL
BUN: 8 mg/dL (ref 6–23)
CO2: 21 meq/L (ref 19–32)
Calcium: 9.3 mg/dL (ref 8.4–10.5)
Chloride: 101 mEq/L (ref 96–112)
Creat: 0.88 mg/dL (ref 0.50–1.35)
GLUCOSE: 92 mg/dL (ref 70–99)
POTASSIUM: 3.8 meq/L (ref 3.5–5.3)
Sodium: 135 mEq/L (ref 135–145)

## 2013-07-20 LAB — B. BURGDORFI ANTIBODIES: B burgdorferi Ab IgG+IgM: 0.91 {ISR} — ABNORMAL HIGH

## 2013-07-20 LAB — HIV ANTIBODY (ROUTINE TESTING W REFLEX): HIV 1&2 Ab, 4th Generation: NONREACTIVE

## 2013-07-20 LAB — CULTURE, URINE COMPREHENSIVE
COLONY COUNT: NO GROWTH
ORGANISM ID, BACTERIA: NO GROWTH

## 2013-07-20 LAB — TSH: TSH: 2.416 u[IU]/mL (ref 0.350–4.500)

## 2013-07-20 NOTE — ED Notes (Signed)
In chart for auditing purposes.

## 2013-07-20 NOTE — Telephone Encounter (Signed)
Pt presented to office today. Reports + diarrhea. Has had recent abx use. Will order stool for c. Diff.

## 2013-07-21 ENCOUNTER — Telehealth (HOSPITAL_BASED_OUTPATIENT_CLINIC_OR_DEPARTMENT_OTHER): Payer: Self-pay

## 2013-07-21 ENCOUNTER — Encounter: Payer: Self-pay | Admitting: Family

## 2013-07-21 LAB — ROCKY MTN SPOTTED FVR AB, IGG-BLOOD: RMSF IgG: 0.54 IV

## 2013-07-21 LAB — ROCKY MTN SPOTTED FVR AB, IGM-BLOOD: RMSF IGM: 0.2 IV (ref 0.00–0.89)

## 2013-07-21 LAB — B. BURGDORFI ANTIBODIES BY WB
B burgdorferi IgG Abs (IB): NEGATIVE
B burgdorferi IgM Abs (IB): NEGATIVE

## 2013-07-21 LAB — CULTURE, GROUP A STREP: Organism ID, Bacteria: NORMAL

## 2013-07-21 NOTE — Telephone Encounter (Signed)
Results received from [facility]. (+) Lyme.  Given Rx for Doxycycline 100 mg po BID x 10 days.  Chart to MD office for review.

## 2013-07-22 ENCOUNTER — Encounter: Payer: Self-pay | Admitting: Family

## 2013-07-22 LAB — CLOSTRIDIUM DIFFICILE BY PCR: Toxigenic C. Difficile by PCR: NOT DETECTED

## 2013-07-23 ENCOUNTER — Other Ambulatory Visit: Payer: Self-pay | Admitting: Family Medicine

## 2013-07-23 DIAGNOSIS — F411 Generalized anxiety disorder: Secondary | ICD-10-CM

## 2013-07-23 LAB — HIV-1 RNA, QUALITATIVE, TMA: HIV-1 RNA, QUAL: NOT DETECTED

## 2013-07-23 MED ORDER — ALPRAZOLAM 0.25 MG PO TABS: 0.2500 mg | ORAL_TABLET | Freq: Two times a day (BID) | ORAL | Status: DC | PRN

## 2013-07-23 NOTE — Progress Notes (Unsigned)
Patient with recent illness and seriously ill grandmother, having tearful episodes  And increased anxiety. Have him take Lexapro daily and given a small amount of Alprazolam to use prn

## 2013-07-25 ENCOUNTER — Encounter: Payer: Self-pay | Admitting: Family

## 2013-07-27 ENCOUNTER — Encounter: Payer: Self-pay | Admitting: Family Medicine

## 2013-07-27 ENCOUNTER — Ambulatory Visit (INDEPENDENT_AMBULATORY_CARE_PROVIDER_SITE_OTHER): Payer: 59 | Admitting: Family Medicine

## 2013-07-27 ENCOUNTER — Other Ambulatory Visit: Payer: Self-pay | Admitting: Family Medicine

## 2013-07-27 VITALS — BP 130/82 | HR 139 | Temp 98.5°F | Ht 70.0 in | Wt 208.0 lb

## 2013-07-27 DIAGNOSIS — I10 Essential (primary) hypertension: Secondary | ICD-10-CM

## 2013-07-27 DIAGNOSIS — I471 Supraventricular tachycardia: Secondary | ICD-10-CM

## 2013-07-27 DIAGNOSIS — R509 Fever, unspecified: Secondary | ICD-10-CM

## 2013-07-27 DIAGNOSIS — I456 Pre-excitation syndrome: Secondary | ICD-10-CM

## 2013-07-27 DIAGNOSIS — R000 Tachycardia, unspecified: Secondary | ICD-10-CM

## 2013-07-27 DIAGNOSIS — R7989 Other specified abnormal findings of blood chemistry: Secondary | ICD-10-CM

## 2013-07-27 DIAGNOSIS — R1013 Epigastric pain: Secondary | ICD-10-CM

## 2013-07-27 DIAGNOSIS — R197 Diarrhea, unspecified: Secondary | ICD-10-CM

## 2013-07-27 DIAGNOSIS — G8929 Other chronic pain: Secondary | ICD-10-CM

## 2013-07-27 DIAGNOSIS — R945 Abnormal results of liver function studies: Secondary | ICD-10-CM

## 2013-07-27 LAB — HEPATIC FUNCTION PANEL
ALBUMIN: 4 g/dL (ref 3.5–5.2)
ALT: 65 U/L — ABNORMAL HIGH (ref 0–53)
AST: 47 U/L — ABNORMAL HIGH (ref 0–37)
Alkaline Phosphatase: 66 U/L (ref 39–117)
BILIRUBIN TOTAL: 0.7 mg/dL (ref 0.2–1.2)
Bilirubin, Direct: 0.1 mg/dL (ref 0.0–0.3)
Indirect Bilirubin: 0.6 mg/dL (ref 0.2–1.2)
Total Protein: 6.4 g/dL (ref 6.0–8.3)

## 2013-07-27 LAB — RENAL FUNCTION PANEL
Albumin: 4 g/dL (ref 3.5–5.2)
BUN: 7 mg/dL (ref 6–23)
CO2: 22 mEq/L (ref 19–32)
CREATININE: 0.76 mg/dL (ref 0.50–1.35)
Calcium: 9 mg/dL (ref 8.4–10.5)
Chloride: 103 mEq/L (ref 96–112)
Glucose, Bld: 128 mg/dL — ABNORMAL HIGH (ref 70–99)
PHOSPHORUS: 3.2 mg/dL (ref 2.3–4.6)
POTASSIUM: 3.5 meq/L (ref 3.5–5.3)
Sodium: 136 mEq/L (ref 135–145)

## 2013-07-27 LAB — T4, FREE: Free T4: 1.23 ng/dL (ref 0.80–1.80)

## 2013-07-27 MED ORDER — METOPROLOL SUCCINATE ER 25 MG PO TB24: 25.0000 mg | ORAL_TABLET | Freq: Every day | ORAL | Status: DC

## 2013-07-27 NOTE — Progress Notes (Signed)
Pre visit review using our clinic review tool, if applicable. No additional management support is needed unless otherwise documented below in the visit note. 

## 2013-07-27 NOTE — Patient Instructions (Signed)
CMV, Cytomegalovirus This test is done if your caregiver suspects you presently have or have had a CMV (cytomegalovirus) infection. It is also done if you are in a clinical situation where it is important to know your CMV status, such as before surgery or organ transplant. It is sometimes done when a young adult, a pregnant male, or an immune-compromised patient has flu-like symptoms that suggest a CMV infection; or if a newborn has multiple congenital anomalies, unexplained jaundice or anemia, and/or if an infant has seizures or developmental problems that may be due to CMV. Testing involves either a measurement of CMV antibodies (specific proteins created by the body's immune system in response to the virus) or the detection of the virus itself, through culturing and growing the virus or by amplifying the genetic material from the virus itself. SAMPLE REQUIRED A blood sample is drawn from a vein in your arm for CMV antibody testing. To detect the virus itself, the sample may be blood, urine, or sputum, amniotic fluid, cerebrospinal fluid, duodenal (from the intestines) fluid, or other body tissue. NORMAL FINDINGS Negative growth, No virus located Ranges for normal findings may vary among different laboratories and hospitals. You should always check with your doctor after having lab work or other tests done to discuss the meaning of your test results and whether your values are considered within normal limits. MEANING OF TEST  Your caregiver will go over the test results with you and discuss the importance and meaning of your results, as well as treatment options and the need for additional tests if necessary. OBTAINING THE TEST RESULTS It is your responsibility to obtain your test results. Ask the lab or department performing the test when and how you will get your results. Document Released: 02/15/2004 Document Revised: 04/08/2011 Document Reviewed: 12/23/2007 [facility] Patient Information 2015  [place], [place]. This information is not intended to replace advice given to you by your health care Carmelle Bamberg. Make sure you discuss any questions you have with your health care Kylene Zamarron.

## 2013-07-28 LAB — CBC WITH DIFFERENTIAL/PLATELET
Basophils Absolute: 0.2 10*3/uL — ABNORMAL HIGH (ref 0.0–0.1)
Basophils Relative: 3 % — ABNORMAL HIGH (ref 0–1)
Eosinophils Absolute: 0 10*3/uL (ref 0.0–0.7)
Eosinophils Relative: 0 % (ref 0–5)
HCT: 41 % (ref 39.0–52.0)
Hemoglobin: 14.6 g/dL (ref 13.0–17.0)
LYMPHS ABS: 3.5 10*3/uL (ref 0.7–4.0)
Lymphocytes Relative: 58 % — ABNORMAL HIGH (ref 12–46)
MCH: 30.6 pg (ref 26.0–34.0)
MCHC: 35.6 g/dL (ref 30.0–36.0)
MCV: 86 fL (ref 78.0–100.0)
Monocytes Absolute: 0.6 10*3/uL (ref 0.1–1.0)
Monocytes Relative: 10 % (ref 3–12)
NEUTROS ABS: 1.7 10*3/uL (ref 1.7–7.7)
NEUTROS PCT: 29 % — AB (ref 43–77)
Platelets: 240 10*3/uL (ref 150–400)
RBC: 4.77 MIL/uL (ref 4.22–5.81)
RDW: 13.1 % (ref 11.5–15.5)
WBC: 6 10*3/uL (ref 4.0–10.5)

## 2013-07-28 LAB — H. PYLORI ANTIBODY, IGG: H PYLORI IGG: 0.45 {ISR}

## 2013-07-28 LAB — THYROID PEROXIDASE ANTIBODY: THYROID PEROXIDASE ANTIBODY: 33.1 [IU]/mL (ref <35.0)

## 2013-07-28 LAB — EPSTEIN-BARR VIRUS NUCLEAR ANTIGEN ANTIBODY, IGG: EBV NA IGG: 377 U/mL — AB (ref <18.0)

## 2013-07-29 LAB — CMV IGM: CMV IgM: >240 AU/mL — ABNORMAL HIGH (ref <30.00)

## 2013-07-30 LAB — IGM: IGM, SERUM: 541 mg/dL — AB (ref 41–251)

## 2013-07-31 ENCOUNTER — Encounter: Payer: Self-pay | Admitting: Family Medicine

## 2013-07-31 DIAGNOSIS — B259 Cytomegaloviral disease, unspecified: Secondary | ICD-10-CM | POA: Insufficient documentation

## 2013-07-31 DIAGNOSIS — R197 Diarrhea, unspecified: Secondary | ICD-10-CM | POA: Insufficient documentation

## 2013-07-31 DIAGNOSIS — R509 Fever, unspecified: Secondary | ICD-10-CM

## 2013-07-31 HISTORY — DX: Fever, unspecified: R50.9

## 2013-07-31 LAB — EHRLICHIA ANTIBODY PANEL

## 2013-07-31 NOTE — Assessment & Plan Note (Signed)
Improving. Encouraged probiotics and bland diet

## 2013-07-31 NOTE — Assessment & Plan Note (Signed)
Continue Amlodipine and add back Metoprolol XL 25 mg daily

## 2013-07-31 NOTE — Assessment & Plan Note (Signed)
Improved s/p ablation but has been ill recently with viral illness and struggling with dehydation, fevers and tachycardia. Will continue to monitor

## 2013-07-31 NOTE — Progress Notes (Signed)
Patient ID: Nicholas Robertson, male   DOB: 03-04-1985, 28 y.o.   MRN: [id number] DORRANCE SELLICK [id number] 18-Jul-1985 07/31/2013      Progress Note-Follow Up  Subjective  Chief Complaint  Chief Complaint  Patient presents with  . Follow-up    HPI  Patient is a 28 year old male in today for routine medical care. Patient is in today for followup. He is struggling with recent viral illness. Has gone through several weeks now of intermittent fevers, malaise, lymphadenopathy, anorexia, nausea, diarrhea, myalgias. Is complaining of a cough and some epigastric discomfort. Notes some weight loss. Acknowledges high levels of depression and anxiety as a result of old distress. Is having trouble controlling his anxiety levels. Did not start Lexapro routinely until about a week ago. At present no fevers. Denies CP/palp/SOB/HA/congestion or GU c/o. Taking meds as prescribed  Past Medical History  Diagnosis Date  . Strep pharyngitis 10/20/2012  . HTN (hypertension)    . Other and unspecified hyperlipidemia    . Abnormal LFTs    . WPW (Wolff-Parkinson-White syndrome)      s/p ablation 02/2013 by [name]  . Preventative health care 04/18/2013  . Fever, unspecified 07/31/2013    Past Surgical History  Procedure Laterality Date  . Ablation  03/09/2013    EPS and RFCA of manifest right posteroloateral accessory pathway    Family History  Problem Relation Age of Onset  . Hypertension Mother   . Hyperlipidemia Mother   . Diabetes Mother     pre  . Mental illness Mother     anxiety  . Diabetes Father   . Hypertension Father   . COPD Maternal Grandmother     former smoker  . Heart disease Maternal Grandfather   . Hyperlipidemia Maternal Grandfather   . Diabetes Maternal Grandfather     type 1  . Hearing loss Maternal Grandfather     cabg  . Hypertension Paternal Grandmother   . Psoriasis Paternal Grandmother   . Heart disease Paternal Grandfather     s/p MI  . Hyperlipidemia Paternal  Grandfather   . Hypertension Paternal Grandfather   . Polymyalgia rheumatica Paternal Grandfather   . Diabetes Paternal Grandfather     History   Social History  . Marital Status: Single    Spouse Name: N/A    Number of Children: N/A  . Years of Education: N/A   Occupational History  . Not on file.   Social History Main Topics  . Smoking status: Never Smoker   . Smokeless tobacco: Never Used  . Alcohol Use: No  . Drug Use: No  . Sexual Activity: Yes     Comment: no dietary restricitons, lives alone,   Other Topics Concern  . Not on file   Social History Narrative  . No narrative on file    Current Outpatient Prescriptions on File Prior to Visit  Medication Sig Dispense Refill  . ALPRAZolam (XANAX) 0.25 MG tablet Take 1 tablet (0.25 mg total) by mouth 2 (two) times daily as needed for anxiety.  20 tablet  0  . amLODipine (NORVASC) 5 MG tablet Take 1 tablet (5 mg total) by mouth daily.  90 tablet  1  . KRILL OIL PO Take 1 tablet by mouth daily.       No current facility-administered medications on file prior to visit.    No Known Allergies  Review of Systems  Review of Systems  Constitutional: Positive for fever and malaise/fatigue.  HENT: Negative  for congestion.   Eyes: Negative for discharge.  Respiratory: Positive for cough. Negative for shortness of breath.   Cardiovascular: Negative for chest pain, palpitations and leg swelling.  Gastrointestinal: Positive for heartburn, nausea and abdominal pain. Negative for diarrhea.  Genitourinary: Negative for dysuria.  Musculoskeletal: Positive for myalgias. Negative for falls.  Skin: Negative for rash.  Neurological: Negative for loss of consciousness and headaches.  Endo/Heme/Allergies: Negative for polydipsia.  Psychiatric/Behavioral: Positive for depression. Negative for suicidal ideas. The patient is nervous/anxious. The patient does not have insomnia.     Objective  BP 130/82  Pulse 139  Temp(Src) 98.5  F (36.9 C) (Oral)  Ht 5\' 10"  (1.778 m)  Wt 208 lb (94.348 kg)  BMI 29.84 kg/m2  SpO2 96%  Physical Exam  Physical Exam  Constitutional: He is oriented to person, place, and time and well-developed, well-nourished, and in no distress. No distress.  HENT:  Head: Normocephalic and atraumatic.  Eyes: Conjunctivae are normal.  Neck: Neck supple. No thyromegaly present.  Cardiovascular: Normal rate, regular rhythm and normal heart sounds.   No murmur heard. Pulmonary/Chest: Effort normal and breath sounds normal. No respiratory distress.  Abdominal: He exhibits no distension and no mass. There is no tenderness.  Musculoskeletal: He exhibits no edema.  Neurological: He is alert and oriented to person, place, and time.  Skin: Skin is warm.  Psychiatric: Memory, affect and judgment normal.    Lab Results  Component Value Date   TSH 2.416 07/19/2013   Lab Results  Component Value Date   WBC 6.0 07/27/2013   HGB 14.6 07/27/2013   HCT 41.0 07/27/2013   MCV 86.0 07/27/2013   PLT 240 07/27/2013   Lab Results  Component Value Date   CREATININE 0.76 07/27/2013   BUN 7 07/27/2013   NA 136 07/27/2013   K 3.5 07/27/2013   CL 103 07/27/2013   CO2 22 07/27/2013   Lab Results  Component Value Date   ALT 65* 07/27/2013   AST 47* 07/27/2013   ALKPHOS 66 07/27/2013   BILITOT 0.7 07/27/2013   Lab Results  Component Value Date   CHOL 231* 02/04/2013   Lab Results  Component Value Date   HDL 49 02/04/2013   Lab Results  Component Value Date   LDLCALC 159* 02/04/2013   Lab Results  Component Value Date   TRIG 115 02/04/2013   Lab Results  Component Value Date   CHOLHDL 4.7 02/04/2013     Assessment & Plan  HTN (hypertension) Well controlled, no changes to meds. Encouraged heart healthy diet such as the DASH diet and exercise as tolerated.   Wolff-Parkinson-White (WPW) syndrome Improved s/p ablation but has been ill recently with viral illness and struggling with dehydation, fevers and  tachycardia. Will continue to monitor  Diarrhea Improving. Encouraged probiotics and bland diet  CMV (cytomegalovirus infection) Increase rest and hydration, treat symptoms and report worsening symptoms  Abnormal LFTs Mild, secondary to viral illness. Minimize simple carbs and saturated fats.  Tachycardia Continue Amlodipine and add back Metoprolol XL 25 mg daily

## 2013-07-31 NOTE — Assessment & Plan Note (Signed)
Increase rest and hydration, treat symptoms and report worsening symptoms

## 2013-07-31 NOTE — Assessment & Plan Note (Signed)
Well controlled, no changes to meds. Encouraged heart healthy diet such as the DASH diet and exercise as tolerated.  °

## 2013-07-31 NOTE — Assessment & Plan Note (Signed)
Mild, secondary to viral illness. Minimize simple carbs and saturated fats.

## 2013-08-02 ENCOUNTER — Ambulatory Visit: Payer: 59 | Admitting: Family

## 2013-08-06 ENCOUNTER — Telehealth (HOSPITAL_BASED_OUTPATIENT_CLINIC_OR_DEPARTMENT_OTHER): Payer: Self-pay | Admitting: Emergency Medicine

## 2013-09-21 ENCOUNTER — Ambulatory Visit: Payer: 59 | Admitting: Family Medicine

## 2013-11-12 ENCOUNTER — Other Ambulatory Visit: Payer: Self-pay

## 2014-01-04 ENCOUNTER — Telehealth: Payer: Self-pay

## 2014-01-04 DIAGNOSIS — R197 Diarrhea, unspecified: Secondary | ICD-10-CM

## 2014-01-04 DIAGNOSIS — Z87898 Personal history of other specified conditions: Secondary | ICD-10-CM

## 2014-01-04 DIAGNOSIS — R1013 Epigastric pain: Secondary | ICD-10-CM

## 2014-01-04 DIAGNOSIS — G8929 Other chronic pain: Secondary | ICD-10-CM

## 2014-01-04 DIAGNOSIS — I471 Supraventricular tachycardia: Secondary | ICD-10-CM

## 2014-01-04 MED ORDER — ESCITALOPRAM OXALATE 10 MG PO TABS: 10.0000 mg | ORAL_TABLET | Freq: Every day | ORAL | Status: DC

## 2014-01-04 MED ORDER — METOPROLOL SUCCINATE ER 25 MG PO TB24: 25.0000 mg | ORAL_TABLET | Freq: Every day | ORAL | Status: DC

## 2014-01-04 NOTE — Telephone Encounter (Signed)
rx sent

## 2014-01-06 ENCOUNTER — Encounter (HOSPITAL_COMMUNITY): Payer: Self-pay | Admitting: Internal Medicine

## 2014-03-16 ENCOUNTER — Other Ambulatory Visit: Payer: Self-pay | Admitting: Family Medicine

## 2014-03-16 MED ORDER — FLUOXETINE HCL 20 MG PO TABS: 20.0000 mg | ORAL_TABLET | Freq: Every day | ORAL | Status: DC

## 2014-07-29 ENCOUNTER — Other Ambulatory Visit: Payer: Self-pay | Admitting: Family Medicine

## 2014-07-29 MED ORDER — FLUTICASONE PROPIONATE 50 MCG/ACT NA SUSP: 2.0000 | Freq: Every day | NASAL | Status: DC | PRN

## 2014-08-02 ENCOUNTER — Other Ambulatory Visit (INDEPENDENT_AMBULATORY_CARE_PROVIDER_SITE_OTHER): Payer: 59

## 2014-08-02 ENCOUNTER — Telehealth: Payer: Self-pay | Admitting: Family Medicine

## 2014-08-02 DIAGNOSIS — R7989 Other specified abnormal findings of blood chemistry: Secondary | ICD-10-CM | POA: Diagnosis not present

## 2014-08-02 DIAGNOSIS — Z0000 Encounter for general adult medical examination without abnormal findings: Secondary | ICD-10-CM

## 2014-08-02 LAB — TSH: TSH: 2.78 u[IU]/mL (ref 0.35–4.50)

## 2014-08-02 LAB — BASIC METABOLIC PANEL
BUN: 10 mg/dL (ref 6–23)
CALCIUM: 9.9 mg/dL (ref 8.4–10.5)
CHLORIDE: 102 meq/L (ref 96–112)
CO2: 25 meq/L (ref 19–32)
CREATININE: 0.91 mg/dL (ref 0.40–1.50)
GFR: 104.76 mL/min (ref >60.00)
Glucose, Bld: 86 mg/dL (ref 70–99)
Potassium: 3.5 mEq/L (ref 3.5–5.1)
Sodium: 141 mEq/L (ref 135–145)

## 2014-08-02 LAB — CBC WITH DIFFERENTIAL/PLATELET
BASOS ABS: 0 10*3/uL (ref 0.0–0.1)
Basophils Relative: 0.4 % (ref 0.0–3.0)
EOS ABS: 0.1 10*3/uL (ref 0.0–0.7)
Eosinophils Relative: 1 % (ref 0.0–5.0)
HEMATOCRIT: 48.7 % (ref 39.0–52.0)
HEMOGLOBIN: 16.8 g/dL (ref 13.0–17.0)
LYMPHS ABS: 3.2 10*3/uL (ref 0.7–4.0)
Lymphocytes Relative: 47.2 % — ABNORMAL HIGH (ref 12.0–46.0)
MCHC: 34.5 g/dL (ref 30.0–36.0)
MCV: 92.9 fl (ref 78.0–100.0)
MONO ABS: 0.6 10*3/uL (ref 0.1–1.0)
Monocytes Relative: 9.4 % (ref 3.0–12.0)
NEUTROS ABS: 2.8 10*3/uL (ref 1.4–7.7)
Neutrophils Relative %: 42 % — ABNORMAL LOW (ref 43.0–77.0)
Platelets: 229 10*3/uL (ref 150.0–400.0)
RBC: 5.24 Mil/uL (ref 4.22–5.81)
RDW: 12.6 % (ref 11.5–15.5)
WBC: 6.8 10*3/uL (ref 4.0–10.5)

## 2014-08-02 LAB — HEPATIC FUNCTION PANEL
ALT: 69 U/L — AB (ref 0–53)
AST: 31 U/L (ref 0–37)
Albumin: 4.5 g/dL (ref 3.5–5.2)
Alkaline Phosphatase: 52 U/L (ref 39–117)
BILIRUBIN TOTAL: 0.7 mg/dL (ref 0.2–1.2)
Bilirubin, Direct: 0.1 mg/dL (ref 0.0–0.3)
Total Protein: 7.6 g/dL (ref 6.0–8.3)

## 2014-08-02 LAB — LIPID PANEL
CHOL/HDL RATIO: 6
CHOLESTEROL: 241 mg/dL — AB (ref 0–200)
HDL: 43.1 mg/dL (ref >39.00)
NonHDL: 197.9
Triglycerides: 212 mg/dL — ABNORMAL HIGH (ref 0.0–149.0)
VLDL: 42.4 mg/dL — ABNORMAL HIGH (ref 0.0–40.0)

## 2014-08-02 LAB — LDL CHOLESTEROL, DIRECT: Direct LDL: 168 mg/dL

## 2014-08-02 LAB — HEMOGLOBIN A1C: HEMOGLOBIN A1C: 5.5 % (ref 4.6–6.5)

## 2014-08-02 LAB — HIV ANTIBODY (ROUTINE TESTING W REFLEX): HIV 1&2 Ab, 4th Generation: NONREACTIVE

## 2014-08-02 NOTE — Telephone Encounter (Signed)
[facility] labs entered

## 2014-08-09 ENCOUNTER — Encounter: Payer: Self-pay | Admitting: *Deleted

## 2014-08-09 ENCOUNTER — Telehealth: Payer: Self-pay | Admitting: *Deleted

## 2014-08-09 NOTE — Telephone Encounter (Signed)
Pre-Visit Call completed with patient and chart updated.   Pre-Visit Info documented in Specialty Comments under [employer].    

## 2014-08-10 ENCOUNTER — Ambulatory Visit (INDEPENDENT_AMBULATORY_CARE_PROVIDER_SITE_OTHER): Payer: 59 | Admitting: Family Medicine

## 2014-08-10 ENCOUNTER — Encounter: Payer: Self-pay | Admitting: Family Medicine

## 2014-08-10 VITALS — BP 150/96 | HR 80 | Temp 98.2°F | Resp 18 | Ht 70.0 in | Wt 227.8 lb

## 2014-08-10 DIAGNOSIS — I10 Essential (primary) hypertension: Secondary | ICD-10-CM

## 2014-08-10 DIAGNOSIS — Z23 Encounter for immunization: Secondary | ICD-10-CM | POA: Diagnosis not present

## 2014-08-10 DIAGNOSIS — Z0000 Encounter for general adult medical examination without abnormal findings: Secondary | ICD-10-CM

## 2014-08-10 DIAGNOSIS — F419 Anxiety disorder, unspecified: Secondary | ICD-10-CM

## 2014-08-10 DIAGNOSIS — R109 Unspecified abdominal pain: Secondary | ICD-10-CM | POA: Diagnosis not present

## 2014-08-10 DIAGNOSIS — F418 Other specified anxiety disorders: Secondary | ICD-10-CM

## 2014-08-10 DIAGNOSIS — F32A Depression, unspecified: Secondary | ICD-10-CM

## 2014-08-10 DIAGNOSIS — F329 Major depressive disorder, single episode, unspecified: Secondary | ICD-10-CM

## 2014-08-10 DIAGNOSIS — I456 Pre-excitation syndrome: Secondary | ICD-10-CM

## 2014-08-10 LAB — URINALYSIS
BILIRUBIN URINE: NEGATIVE
Hgb urine dipstick: NEGATIVE
KETONES UR: NEGATIVE
LEUKOCYTES UA: NEGATIVE
Nitrite: NEGATIVE
PH: 7.5 (ref 5.0–8.0)
Specific Gravity, Urine: 1.015 (ref 1.000–1.030)
Total Protein, Urine: NEGATIVE
Urine Glucose: NEGATIVE
Urobilinogen, UA: 0.2 (ref 0.0–1.0)

## 2014-08-10 NOTE — Progress Notes (Signed)
Pre visit review using our clinic review tool, if applicable. No additional management support is needed unless otherwise documented below in the visit note. 

## 2014-08-10 NOTE — Patient Instructions (Signed)
[email or web address] carries [place]. 10 strain probiotic, krill oil, Red Yeast Rice,  Aged Garlic or Black Elderberry and zinc Vitamin C daily  Preventive Care for Adults A healthy lifestyle and preventive care can promote health and wellness. Preventive health guidelines for men include the following key practices:  A routine yearly physical is a good way to check with your health care provider about your health and preventative screening. It is a chance to share any concerns and updates on your health and to receive a thorough exam.  Visit your dentist for a routine exam and preventative care every 6 months. Brush your teeth twice a day and floss once a day. Good oral hygiene prevents tooth decay and gum disease.  The frequency of eye exams is based on your age, health, family medical history, use of contact lenses, and other factors. Follow your health care provider's recommendations for frequency of eye exams.  Eat a healthy diet. Foods such as vegetables, fruits, whole grains, low-fat dairy products, and lean protein foods contain the nutrients you need without too many calories. Decrease your intake of foods high in solid fats, added sugars, and salt. Eat the right amount of calories for you.Get information about a proper diet from your health care provider, if necessary.  Regular physical exercise is one of the most important things you can do for your health. Most adults should get at least 150 minutes of moderate-intensity exercise (any activity that increases your heart rate and causes you to sweat) each week. In addition, most adults need muscle-strengthening exercises on 2 or more days a week.  Maintain a healthy weight. The body mass index (BMI) is a screening tool to identify possible weight problems. It provides an estimate of body fat based on height and weight. Your health care provider can find your BMI and can help you achieve or maintain a healthy weight.For adults 20 years  and older:  A BMI below 18.5 is considered underweight.  A BMI of 18.5 to 24.9 is normal.  A BMI of 25 to 29.9 is considered overweight.  A BMI of 30 and above is considered obese.  Maintain normal blood lipids and cholesterol levels by exercising and minimizing your intake of saturated fat. Eat a balanced diet with plenty of fruit and vegetables. Blood tests for lipids and cholesterol should begin at age 25 and be repeated every 5 years. If your lipid or cholesterol levels are high, you are over 50, or you are at high risk for heart disease, you may need your cholesterol levels checked more frequently.Ongoing high lipid and cholesterol levels should be treated with medicines if diet and exercise are not working.  If you smoke, find out from your health care provider how to quit. If you do not use tobacco, do not start.  Lung cancer screening is recommended for adults aged 9-80 years who are at high risk for developing lung cancer because of a history of smoking. A yearly low-dose CT scan of the lungs is recommended for people who have at least a 30-pack-year history of smoking and are a current smoker or have quit within the past 15 years. A pack year of smoking is smoking an average of 1 pack of cigarettes a day for 1 year (for example: 1 pack a day for 30 years or 2 packs a day for 15 years). Yearly screening should continue until the smoker has stopped smoking for at least 15 years. Yearly screening should be stopped for people who develop  a health problem that would prevent them from having lung cancer treatment.  If you choose to drink alcohol, do not have more than 2 drinks per day. One drink is considered to be 12 ounces (355 mL) of beer, 5 ounces (148 mL) of wine, or 1.5 ounces (44 mL) of liquor.  Avoid use of street drugs. Do not share needles with anyone. Ask for help if you need support or instructions about stopping the use of drugs.  High blood pressure causes heart disease and  increases the risk of stroke. Your blood pressure should be checked at least every 1-2 years. Ongoing high blood pressure should be treated with medicines, if weight loss and exercise are not effective.  If you are 56-81 years old, ask your health care provider if you should take aspirin to prevent heart disease.  Diabetes screening involves taking a blood sample to check your fasting blood sugar level. This should be done once every 3 years, after age 4, if you are within normal weight and without risk factors for diabetes. Testing should be considered at a younger age or be carried out more frequently if you are overweight and have at least 1 risk factor for diabetes.  Colorectal cancer can be detected and often prevented. Most routine colorectal cancer screening begins at the age of 75 and continues through age 42. However, your health care provider may recommend screening at an earlier age if you have risk factors for colon cancer. On a yearly basis, your health care provider may provide home test kits to check for hidden blood in the stool. Use of a small camera at the end of a tube to directly examine the colon (sigmoidoscopy or colonoscopy) can detect the earliest forms of colorectal cancer. Talk to your health care provider about this at age 18, when routine screening begins. Direct exam of the colon should be repeated every 5-10 years through age 61, unless early forms of precancerous polyps or small growths are found.  People who are at an increased risk for hepatitis B should be screened for this virus. You are considered at high risk for hepatitis B if:  You were born in a country where hepatitis B occurs often. Talk with your health care provider about which countries are considered high risk.  Your parents were born in a high-risk country and you have not received a shot to protect against hepatitis B (hepatitis B vaccine).  You have HIV or AIDS.  You use needles to inject street  drugs.  You live with, or have sex with, someone who has hepatitis B.  You are a man who has sex with other men (MSM).  You get hemodialysis treatment.  You take certain medicines for conditions such as cancer, organ transplantation, and autoimmune conditions.  Hepatitis C blood testing is recommended for all people born from [birth year] through 1965 and any individual with known risks for hepatitis C.  Practice safe sex. Use condoms and avoid high-risk sexual practices to reduce the spread of sexually transmitted infections (STIs). STIs include gonorrhea, chlamydia, syphilis, trichomonas, herpes, HPV, and human immunodeficiency virus (HIV). Herpes, HIV, and HPV are viral illnesses that have no cure. They can result in disability, cancer, and death.  If you are at risk of being infected with HIV, it is recommended that you take a prescription medicine daily to prevent HIV infection. This is called preexposure prophylaxis (PrEP). You are considered at risk if:  You are a man who has sex with other  men (MSM) and have other risk factors.  You are a heterosexual man, are sexually active, and are at increased risk for HIV infection.  You take drugs by injection.  You are sexually active with a partner who has HIV.  Talk with your health care provider about whether you are at high risk of being infected with HIV. If you choose to begin PrEP, you should first be tested for HIV. You should then be tested every 3 months for as long as you are taking PrEP.  A one-time screening for abdominal aortic aneurysm (AAA) and surgical repair of large AAAs by ultrasound are recommended for men ages 37 to 44 years who are current or former smokers.  Healthy men should no longer receive prostate-specific antigen (PSA) blood tests as part of routine cancer screening. Talk with your health care provider about prostate cancer screening.  Testicular cancer screening is not recommended for adult males who have no  symptoms. Screening includes self-exam, a health care provider exam, and other screening tests. Consult with your health care provider about any symptoms you have or any concerns you have about testicular cancer.  Use sunscreen. Apply sunscreen liberally and repeatedly throughout the day. You should seek shade when your shadow is shorter than you. Protect yourself by wearing long sleeves, pants, a wide-brimmed hat, and sunglasses year round, whenever you are outdoors.  Once a month, do a whole-body skin exam, using a mirror to look at the skin on your back. Tell your health care provider about new moles, moles that have irregular borders, moles that are larger than a pencil eraser, or moles that have changed in shape or color.  Stay current with required vaccines (immunizations).  Influenza vaccine. All adults should be immunized every year.  Tetanus, diphtheria, and acellular pertussis (Td, Tdap) vaccine. An adult who has not previously received Tdap or who does not know his vaccine status should receive 1 dose of Tdap. This initial dose should be followed by tetanus and diphtheria toxoids (Td) booster doses every 10 years. Adults with an unknown or incomplete history of completing a 3-dose immunization series with Td-containing vaccines should begin or complete a primary immunization series including a Tdap dose. Adults should receive a Td booster every 10 years.  Varicella vaccine. An adult without evidence of immunity to varicella should receive 2 doses or a second dose if he has previously received 1 dose.  Human papillomavirus (HPV) vaccine. Males aged 86-21 years who have not received the vaccine previously should receive the 3-dose series. Males aged 22-26 years may be immunized. Immunization is recommended through the age of 82 years for any male who has sex with males and did not get any or all doses earlier. Immunization is recommended for any person with an immunocompromised condition  through the age of 75 years if he did not get any or all doses earlier. During the 3-dose series, the second dose should be obtained 4-8 weeks after the first dose. The third dose should be obtained 24 weeks after the first dose and 16 weeks after the second dose.  Zoster vaccine. One dose is recommended for adults aged 77 years or older unless certain conditions are present.  Measles, mumps, and rubella (MMR) vaccine. Adults born before [birth year] generally are considered immune to measles and mumps. Adults born in [birth year] or later should have 1 or more doses of MMR vaccine unless there is a contraindication to the vaccine or there is laboratory evidence of immunity to each of  the three diseases. A routine second dose of MMR vaccine should be obtained at least 28 days after the first dose for students attending postsecondary schools, health care workers, or international travelers. People who received inactivated measles vaccine or an unknown type of measles vaccine during 1963-1967 should receive 2 doses of MMR vaccine. People who received inactivated mumps vaccine or an unknown type of mumps vaccine before 1979 and are at high risk for mumps infection should consider immunization with 2 doses of MMR vaccine. Unvaccinated health care workers born before [birth year] who lack laboratory evidence of measles, mumps, or rubella immunity or laboratory confirmation of disease should consider measles and mumps immunization with 2 doses of MMR vaccine or rubella immunization with 1 dose of MMR vaccine.  Pneumococcal 13-valent conjugate (PCV13) vaccine. When indicated, a person who is uncertain of his immunization history and has no record of immunization should receive the PCV13 vaccine. An adult aged 59 years or older who has certain medical conditions and has not been previously immunized should receive 1 dose of PCV13 vaccine. This PCV13 should be followed with a dose of pneumococcal polysaccharide (PPSV23) vaccine. The PPSV23  vaccine dose should be obtained at least 8 weeks after the dose of PCV13 vaccine. An adult aged 55 years or older who has certain medical conditions and previously received 1 or more doses of PPSV23 vaccine should receive 1 dose of PCV13. The PCV13 vaccine dose should be obtained 1 or more years after the last PPSV23 vaccine dose.  Pneumococcal polysaccharide (PPSV23) vaccine. When PCV13 is also indicated, PCV13 should be obtained first. All adults aged 27 years and older should be immunized. An adult younger than age 37 years who has certain medical conditions should be immunized. Any person who resides in a nursing home or long-term care facility should be immunized. An adult smoker should be immunized. People with an immunocompromised condition and certain other conditions should receive both PCV13 and PPSV23 vaccines. People with human immunodeficiency virus (HIV) infection should be immunized as soon as possible after diagnosis. Immunization during chemotherapy or radiation therapy should be avoided. Routine use of PPSV23 vaccine is not recommended for American Indians, [place]atives, or people younger than 65 years unless there are medical conditions that require PPSV23 vaccine. When indicated, people who have unknown immunization and have no record of immunization should receive PPSV23 vaccine. One-time revaccination 5 years after the first dose of PPSV23 is recommended for people aged 19-64 years who have chronic kidney failure, nephrotic syndrome, asplenia, or immunocompromised conditions. People who received 1-2 doses of PPSV23 before age 75 years should receive another dose of PPSV23 vaccine at age 21 years or later if at least 5 years have passed since the previous dose. Doses of PPSV23 are not needed for people immunized with PPSV23 at or after age 13 years.  Meningococcal vaccine. Adults with asplenia or persistent complement component deficiencies should receive 2 doses of quadrivalent  meningococcal conjugate (MenACWY-D) vaccine. The doses should be obtained at least 2 months apart. Microbiologists working with certain meningococcal bacteria, [place] recruits, people at risk during an outbreak, and people who travel to or live in countries with a high rate of meningitis should be immunized. A first-year college student up through age 59 years who is living in a residence hall should receive a dose if he did not receive a dose on or after his 16th birthday. Adults who have certain high-risk conditions should receive one or more doses of vaccine.  Hepatitis A vaccine.  Adults who wish to be protected from this disease, have certain high-risk conditions, work with hepatitis A-infected animals, work in hepatitis A research labs, or travel to or work in countries with a high rate of hepatitis A should be immunized. Adults who were previously unvaccinated and who anticipate close contact with an international adoptee during the first 60 days after arrival in the [place] from a country with a high rate of hepatitis A should be immunized.  Hepatitis B vaccine. Adults should be immunized if they wish to be protected from this disease, have certain high-risk conditions, may be exposed to blood or other infectious body fluids, are household contacts or sex partners of hepatitis B positive people, are clients or workers in certain care facilities, or travel to or work in countries with a high rate of hepatitis B.  Haemophilus influenzae type b (Hib) vaccine. A previously unvaccinated person with asplenia or sickle cell disease or having a scheduled splenectomy should receive 1 dose of Hib vaccine. Regardless of previous immunization, a recipient of a hematopoietic stem cell transplant should receive a 3-dose series 6-12 months after his successful transplant. Hib vaccine is not recommended for adults with HIV infection. Preventive Service / Frequency Ages 20 to 46  Blood pressure check.** /  Every 1 to 2 years.  Lipid and cholesterol check.** / Every 5 years beginning at age 63.  Hepatitis C blood test.** / For any individual with known risks for hepatitis C.  Skin self-exam. / Monthly.  Influenza vaccine. / Every year.  Tetanus, diphtheria, and acellular pertussis (Tdap, Td) vaccine.** / Consult your health care provider. 1 dose of Td every 10 years.  Varicella vaccine.** / Consult your health care provider.  HPV vaccine. / 3 doses over 6 months, if 31 or younger.  Measles, mumps, rubella (MMR) vaccine.** / You need at least 1 dose of MMR if you were born in 1957 or later. You may also need a second dose.  Pneumococcal 13-valent conjugate (PCV13) vaccine.** / Consult your health care provider.  Pneumococcal polysaccharide (PPSV23) vaccine.** / 1 to 2 doses if you smoke cigarettes or if you have certain conditions.  Meningococcal vaccine.** / 1 dose if you are age 32 to 78 years and a Market researcher living in a residence hall, or have one of several medical conditions. You may also need additional booster doses.  Hepatitis A vaccine.** / Consult your health care provider.  Hepatitis B vaccine.** / Consult your health care provider.  Haemophilus influenzae type b (Hib) vaccine.** / Consult your health care provider. Ages 37 to 11  Blood pressure check.** / Every 1 to 2 years.  Lipid and cholesterol check.** / Every 5 years beginning at age 87.  Lung cancer screening. / Every year if you are aged 41-80 years and have a 30-pack-year history of smoking and currently smoke or have quit within the past 15 years. Yearly screening is stopped once you have quit smoking for at least 15 years or develop a health problem that would prevent you from having lung cancer treatment.  Fecal occult blood test (FOBT) of stool. / Every year beginning at age 65 and continuing until age 80. You may not have to do this test if you get a colonoscopy every 10 years.  Flexible  sigmoidoscopy** or colonoscopy.** / Every 5 years for a flexible sigmoidoscopy or every 10 years for a colonoscopy beginning at age 90 and continuing until age 57.  Hepatitis C blood test.** / For all  people born from [birth year] through 1965 and any individual with known risks for hepatitis C.  Skin self-exam. / Monthly.  Influenza vaccine. / Every year.  Tetanus, diphtheria, and acellular pertussis (Tdap/Td) vaccine.** / Consult your health care provider. 1 dose of Td every 10 years.  Varicella vaccine.** / Consult your health care provider.  Zoster vaccine.** / 1 dose for adults aged 60 years or older.  Measles, mumps, rubella (MMR) vaccine.** / You need at least 1 dose of MMR if you were born in 1957 or later. You may also need a second dose.  Pneumococcal 13-valent conjugate (PCV13) vaccine.** / Consult your health care provider.  Pneumococcal polysaccharide (PPSV23) vaccine.** / 1 to 2 doses if you smoke cigarettes or if you have certain conditions.  Meningococcal vaccine.** / Consult your health care provider.  Hepatitis A vaccine.** / Consult your health care provider.  Hepatitis B vaccine.** / Consult your health care provider.  Haemophilus influenzae type b (Hib) vaccine.** / Consult your health care provider. Ages 21 and over  Blood pressure check.** / Every 1 to 2 years.  Lipid and cholesterol check.**/ Every 5 years beginning at age 75.  Lung cancer screening. / Every year if you are aged 9-80 years and have a 30-pack-year history of smoking and currently smoke or have quit within the past 15 years. Yearly screening is stopped once you have quit smoking for at least 15 years or develop a health problem that would prevent you from having lung cancer treatment.  Fecal occult blood test (FOBT) of stool. / Every year beginning at age 45 and continuing until age 36. You may not have to do this test if you get a colonoscopy every 10 years.  Flexible sigmoidoscopy** or  colonoscopy.** / Every 5 years for a flexible sigmoidoscopy or every 10 years for a colonoscopy beginning at age 57 and continuing until age 63.  Hepatitis C blood test.** / For all people born from [birth year] through 1965 and any individual with known risks for hepatitis C.  Abdominal aortic aneurysm (AAA) screening.** / A one-time screening for ages 105 to 60 years who are current or former smokers.  Skin self-exam. / Monthly.  Influenza vaccine. / Every year.  Tetanus, diphtheria, and acellular pertussis (Tdap/Td) vaccine.** / 1 dose of Td every 10 years.  Varicella vaccine.** / Consult your health care provider.  Zoster vaccine.** / 1 dose for adults aged 8 years or older.  Pneumococcal 13-valent conjugate (PCV13) vaccine.** / Consult your health care provider.  Pneumococcal polysaccharide (PPSV23) vaccine.** / 1 dose for all adults aged 37 years and older.  Meningococcal vaccine.** / Consult your health care provider.  Hepatitis A vaccine.** / Consult your health care provider.  Hepatitis B vaccine.** / Consult your health care provider.  Haemophilus influenzae type b (Hib) vaccine.** / Consult your health care provider. **Family history and personal history of risk and conditions may change your health care provider's recommendations. Document Released: 03/12/2001 Document Revised: 01/19/2013 Document Reviewed: 06/11/2010 [facility] Patient Information 2015 [place], [place]. This information is not intended to replace advice given to you by your health care provider. Make sure you discuss any questions you have with your health care provider.

## 2014-08-21 ENCOUNTER — Encounter: Payer: Self-pay | Admitting: Family Medicine

## 2014-08-21 MED ORDER — METOPROLOL SUCCINATE ER 50 MG PO TB24: 50.0000 mg | ORAL_TABLET | Freq: Every day | ORAL | Status: DC

## 2014-08-21 NOTE — Progress Notes (Signed)
Nicholas Robertson  [id number] 1986-01-27 08/21/2014      Progress Note-Follow Up  Subjective  Chief Complaint  Chief Complaint  Patient presents with  . Annual Exam    HPI  Patient is a 29 y.o. male in today for routine medical care. He is in today for annual exam. No significant episodes of palpitations. Acknowledges BP has been up lately. Does note an occasional HA which tends to start as tension in the neck. Uses Tylenol prn with good results. No recent illness. Denies CP/palp/SOB/HA/congestion/fevers/GI or GU c/o. Taking meds as prescribed  Past Medical History  Diagnosis Date  . Strep pharyngitis 10/20/2012  . HTN (hypertension)    . Other and unspecified hyperlipidemia    . Abnormal LFTs    . WPW (Wolff-Parkinson-White syndrome)      s/p ablation 02/2013 by [name]  . Preventative health care 04/18/2013  . Fever, unspecified 07/31/2013    Past Surgical History  Procedure Laterality Date  . Ablation  03/09/2013    EPS and RFCA of manifest right posteroloateral accessory pathway  . Supraventricular tachycardia ablation N/A 03/08/2013    Procedure: WPW ABLATION;  Surgeon: [name], MD;  Location: [facility] CATH LAB;  Service: Cardiovascular;  Laterality: N/A;    Family History  Problem Relation Age of Onset  . Hypertension Mother   . Hyperlipidemia Mother   . Mental illness Mother     anxiety  . Other Mother     glucose intolerant  . Diabetes Father     2  . Hypertension Father   . Psoriasis Maternal Grandmother   . Hypertension Maternal Grandmother   . Hyperlipidemia Maternal Grandfather   . Heart disease Maternal Grandfather     cabg  . Hypertension Maternal Grandfather   . Polymyalgia rheumatica Maternal Grandfather   . Hypertension Paternal Grandmother   . Diabetes Paternal Grandmother     2  . COPD Paternal Grandmother   . Heart disease Paternal Grandfather     s/p MI, s/p CABG  . Hyperlipidemia Paternal Grandfather   . Hypertension Paternal  Grandfather   . Diabetes Paternal Grandfather     1    History   Social History  . Marital Status: Single    Spouse Name: N/A  . Number of Children: N/A  . Years of Education: N/A   Occupational History  . Not on file.   Social History Main Topics  . Smoking status: Never Smoker   . Smokeless tobacco: Never Used  . Alcohol Use: No  . Drug Use: No  . Sexual Activity: Yes     Comment: no dietary restricitons, lives alone with cat, seat belts   Other Topics Concern  . Not on file   Social History Narrative    Current Outpatient Prescriptions on File Prior to Visit  Medication Sig Dispense Refill  . amLODipine (NORVASC) 5 MG tablet Take 1 tablet (5 mg total) by mouth daily. 90 tablet 1  . fluticasone (FLONASE) 50 MCG/ACT nasal spray Place 2 sprays into both nostrils daily as needed for allergies or rhinitis. 16 g 6  . KRILL OIL PO Take 1 tablet by mouth daily.     No current facility-administered medications on file prior to visit.    No Known Allergies  Review of Systems  Review of Systems  Constitutional: Negative for fever, chills and malaise/fatigue.  HENT: Negative for congestion, hearing loss and nosebleeds.   Eyes: Negative for discharge.  Respiratory: Negative for cough, sputum  production, shortness of breath and wheezing.   Cardiovascular: Negative for chest pain, palpitations and leg swelling.  Gastrointestinal: Negative for heartburn, nausea, vomiting, abdominal pain, diarrhea, constipation and blood in stool.  Genitourinary: Negative for dysuria, urgency, frequency and hematuria.  Musculoskeletal: Negative for myalgias, back pain and falls.  Skin: Negative for rash.  Neurological: Positive for headaches. Negative for dizziness, tremors, sensory change, focal weakness, loss of consciousness and weakness.  Endo/Heme/Allergies: Negative for polydipsia. Does not bruise/bleed easily.  Psychiatric/Behavioral: Negative for depression and suicidal ideas. The  patient does not have insomnia.     Objective  BP 150/96 mmHg  Pulse 80  Temp(Src) 98.2 F (36.8 C) (Oral)  Resp 18  Ht 5\' 10"  (1.778 m)  Wt 227 lb 12.8 oz (103.329 kg)  BMI 32.69 kg/m2  SpO2 99%  Physical Exam  Physical Exam  Constitutional: He is oriented to person, place, and time and well-developed, well-nourished, and in no distress. No distress.  HENT:  Head: Normocephalic and atraumatic.  Eyes: Conjunctivae are normal.  Neck: Neck supple. No thyromegaly present.  Cardiovascular: Normal rate, regular rhythm and normal heart sounds.   No murmur heard. Pulmonary/Chest: Effort normal and breath sounds normal. No respiratory distress.  Abdominal: Soft. Bowel sounds are normal. He exhibits no distension and no mass. There is no tenderness. There is no rebound and no guarding.  Musculoskeletal: He exhibits no edema.  Neurological: He is alert and oriented to person, place, and time.  Skin: Skin is warm.  Psychiatric: Memory, affect and judgment normal.    Lab Results  Component Value Date   TSH 2.78 08/02/2014   Lab Results  Component Value Date   WBC 6.8 08/02/2014   HGB 16.8 08/02/2014   HCT 48.7 08/02/2014   MCV 92.9 08/02/2014   PLT 229.0 08/02/2014   Lab Results  Component Value Date   CREATININE 0.91 08/02/2014   BUN 10 08/02/2014   NA 141 08/02/2014   K 3.5 08/02/2014   CL 102 08/02/2014   CO2 25 08/02/2014   Lab Results  Component Value Date   ALT 69* 08/02/2014   AST 31 08/02/2014   ALKPHOS 52 08/02/2014   BILITOT 0.7 08/02/2014   Lab Results  Component Value Date   CHOL 241* 08/02/2014   Lab Results  Component Value Date   HDL 43.10 08/02/2014   Lab Results  Component Value Date   LDLCALC 159* 02/04/2013   Lab Results  Component Value Date   TRIG 212.0* 08/02/2014   Lab Results  Component Value Date   CHOLHDL 6 08/02/2014     Assessment & Plan  HTN (hypertension) Will increase Metoprolol XR to 50 mg daily. Continue  Amlodipine  Wolff-Parkinson-White (WPW) syndrome No recent episodes  Anxiety and depression Improved on Fluoxetine   Preventative health care Patient encouraged to maintain heart healthy diet, regular exercise, adequate sleep. Consider daily probiotics. Take medications as prescribed. Given and reviewed copy of ACP documents from [employer] and encouraged to complete and return

## 2014-08-21 NOTE — Assessment & Plan Note (Signed)
Improved on Fluoxetine

## 2014-08-21 NOTE — Assessment & Plan Note (Signed)
Patient encouraged to maintain heart healthy diet, regular exercise, adequate sleep. Consider daily probiotics. Take medications as prescribed. Given and reviewed copy of ACP documents from [place] Secretary of State and encouraged to complete and return 

## 2014-08-21 NOTE — Assessment & Plan Note (Addendum)
Will increase Metoprolol XR to 50 mg daily. Continue Amlodipine

## 2014-08-21 NOTE — Assessment & Plan Note (Signed)
No recent episodes

## 2014-09-22 ENCOUNTER — Other Ambulatory Visit: Payer: Self-pay | Admitting: Family Medicine

## 2014-09-22 DIAGNOSIS — N509 Disorder of male genital organs, unspecified: Secondary | ICD-10-CM

## 2014-09-29 ENCOUNTER — Other Ambulatory Visit: Payer: 59

## 2014-09-29 ENCOUNTER — Ambulatory Visit
Admission: RE | Admit: 2014-09-29 | Discharge: 2014-09-29 | Disposition: A | Discharge status: Home / Self Care | Payer: 59 | Source: Ambulatory Visit | Attending: Family Medicine | Admitting: Family Medicine

## 2014-09-29 DIAGNOSIS — N509 Disorder of male genital organs, unspecified: Secondary | ICD-10-CM

## 2014-12-09 ENCOUNTER — Telehealth: Payer: Self-pay | Admitting: Family Medicine

## 2014-12-09 MED ORDER — SULFAMETHOXAZOLE-TRIMETHOPRIM 800-160 MG PO TABS: 1.0000 | ORAL_TABLET | Freq: Two times a day (BID) | ORAL | Status: DC

## 2014-12-09 NOTE — Telephone Encounter (Signed)
Pt called indicating 8 days of worsening L sided sinus pain/pressure.  Initially had fever but this resolved.  Now w/ severe, throbbing HA, nasal congestion.  Unable to tolerate Augmentin in the past.  Will send Bactrim to pharmacy for infection.

## 2015-01-18 ENCOUNTER — Telehealth: Payer: Self-pay | Admitting: Family

## 2015-01-18 MED ORDER — TRIAMCINOLONE ACETONIDE 0.1 % EX CREA: 1.0000 "application " | TOPICAL_CREAM | Freq: Two times a day (BID) | CUTANEOUS | Status: DC

## 2015-01-18 NOTE — Telephone Encounter (Signed)
Patient reports flare up of eczema.  Requests steroid cream rx.

## 2015-04-26 ENCOUNTER — Ambulatory Visit (INDEPENDENT_AMBULATORY_CARE_PROVIDER_SITE_OTHER): Payer: 59 | Admitting: Psychology

## 2015-04-26 DIAGNOSIS — F4323 Adjustment disorder with mixed anxiety and depressed mood: Secondary | ICD-10-CM

## 2015-04-27 DIAGNOSIS — A630 Anogenital (venereal) warts: Secondary | ICD-10-CM | POA: Diagnosis not present

## 2015-05-04 ENCOUNTER — Ambulatory Visit (INDEPENDENT_AMBULATORY_CARE_PROVIDER_SITE_OTHER): Payer: 59 | Admitting: Psychology

## 2015-05-04 DIAGNOSIS — F4323 Adjustment disorder with mixed anxiety and depressed mood: Secondary | ICD-10-CM

## 2015-05-10 ENCOUNTER — Encounter: Payer: Self-pay | Admitting: Family Medicine

## 2015-05-10 ENCOUNTER — Ambulatory Visit (HOSPITAL_BASED_OUTPATIENT_CLINIC_OR_DEPARTMENT_OTHER)
Admission: RE | Admit: 2015-05-10 | Discharge: 2015-05-10 | Disposition: A | Discharge status: Home / Self Care | Payer: 59 | Source: Ambulatory Visit | Attending: Family Medicine | Admitting: Family Medicine

## 2015-05-10 ENCOUNTER — Ambulatory Visit (INDEPENDENT_AMBULATORY_CARE_PROVIDER_SITE_OTHER): Payer: 59 | Admitting: Family Medicine

## 2015-05-10 VITALS — BP 156/104 | HR 86 | Temp 98.5°F | Resp 18 | Ht 70.0 in

## 2015-05-10 DIAGNOSIS — S39012A Strain of muscle, fascia and tendon of lower back, initial encounter: Secondary | ICD-10-CM

## 2015-05-10 DIAGNOSIS — M545 Low back pain: Secondary | ICD-10-CM | POA: Insufficient documentation

## 2015-05-10 IMAGING — CR DG CHEST 2V
2 series · 2 of 2 positions shown · non-contrast
Comparison: None.

CLINICAL DATA: Fever.

EXAM:
CHEST  2 VIEW

[w chest pa]
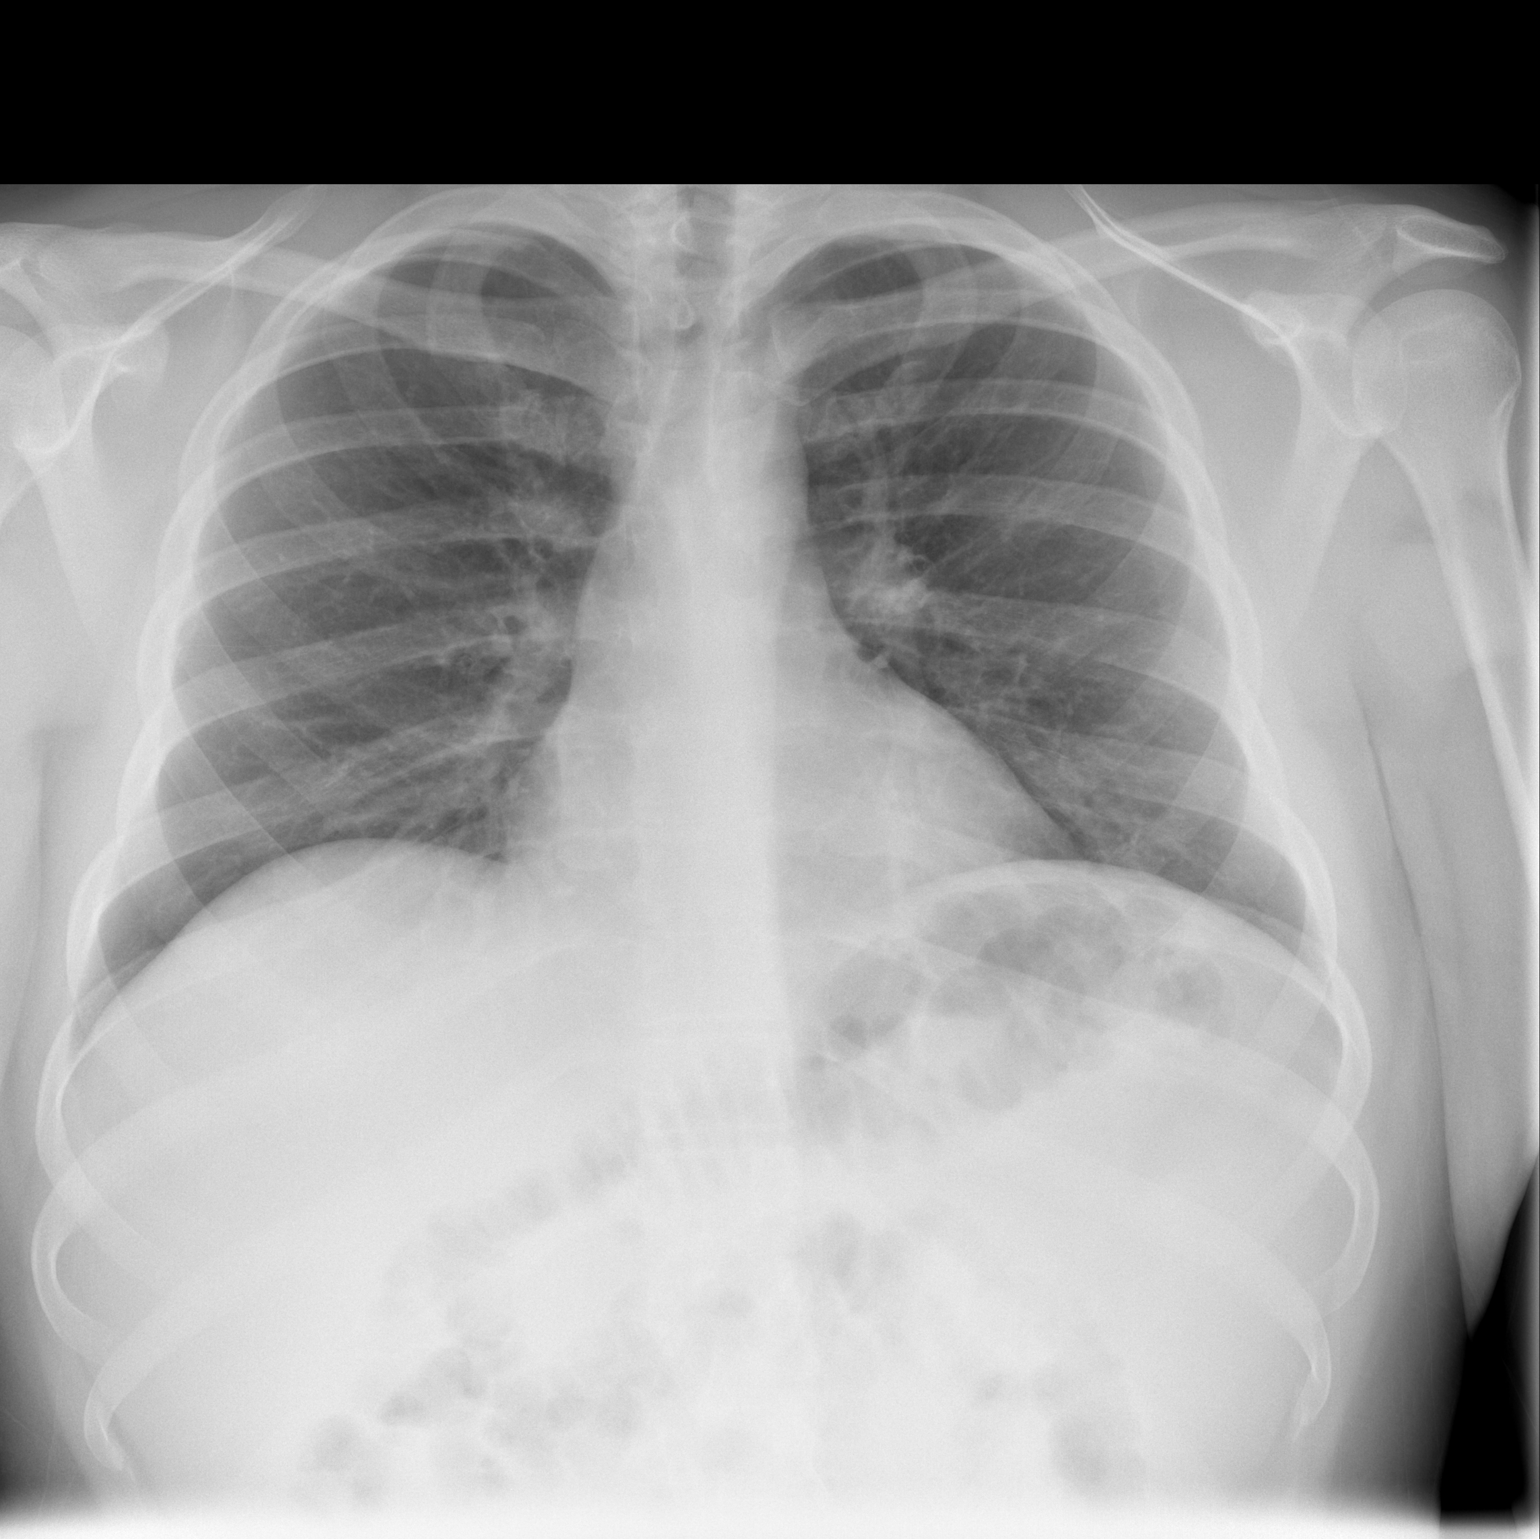

[w chest lat]
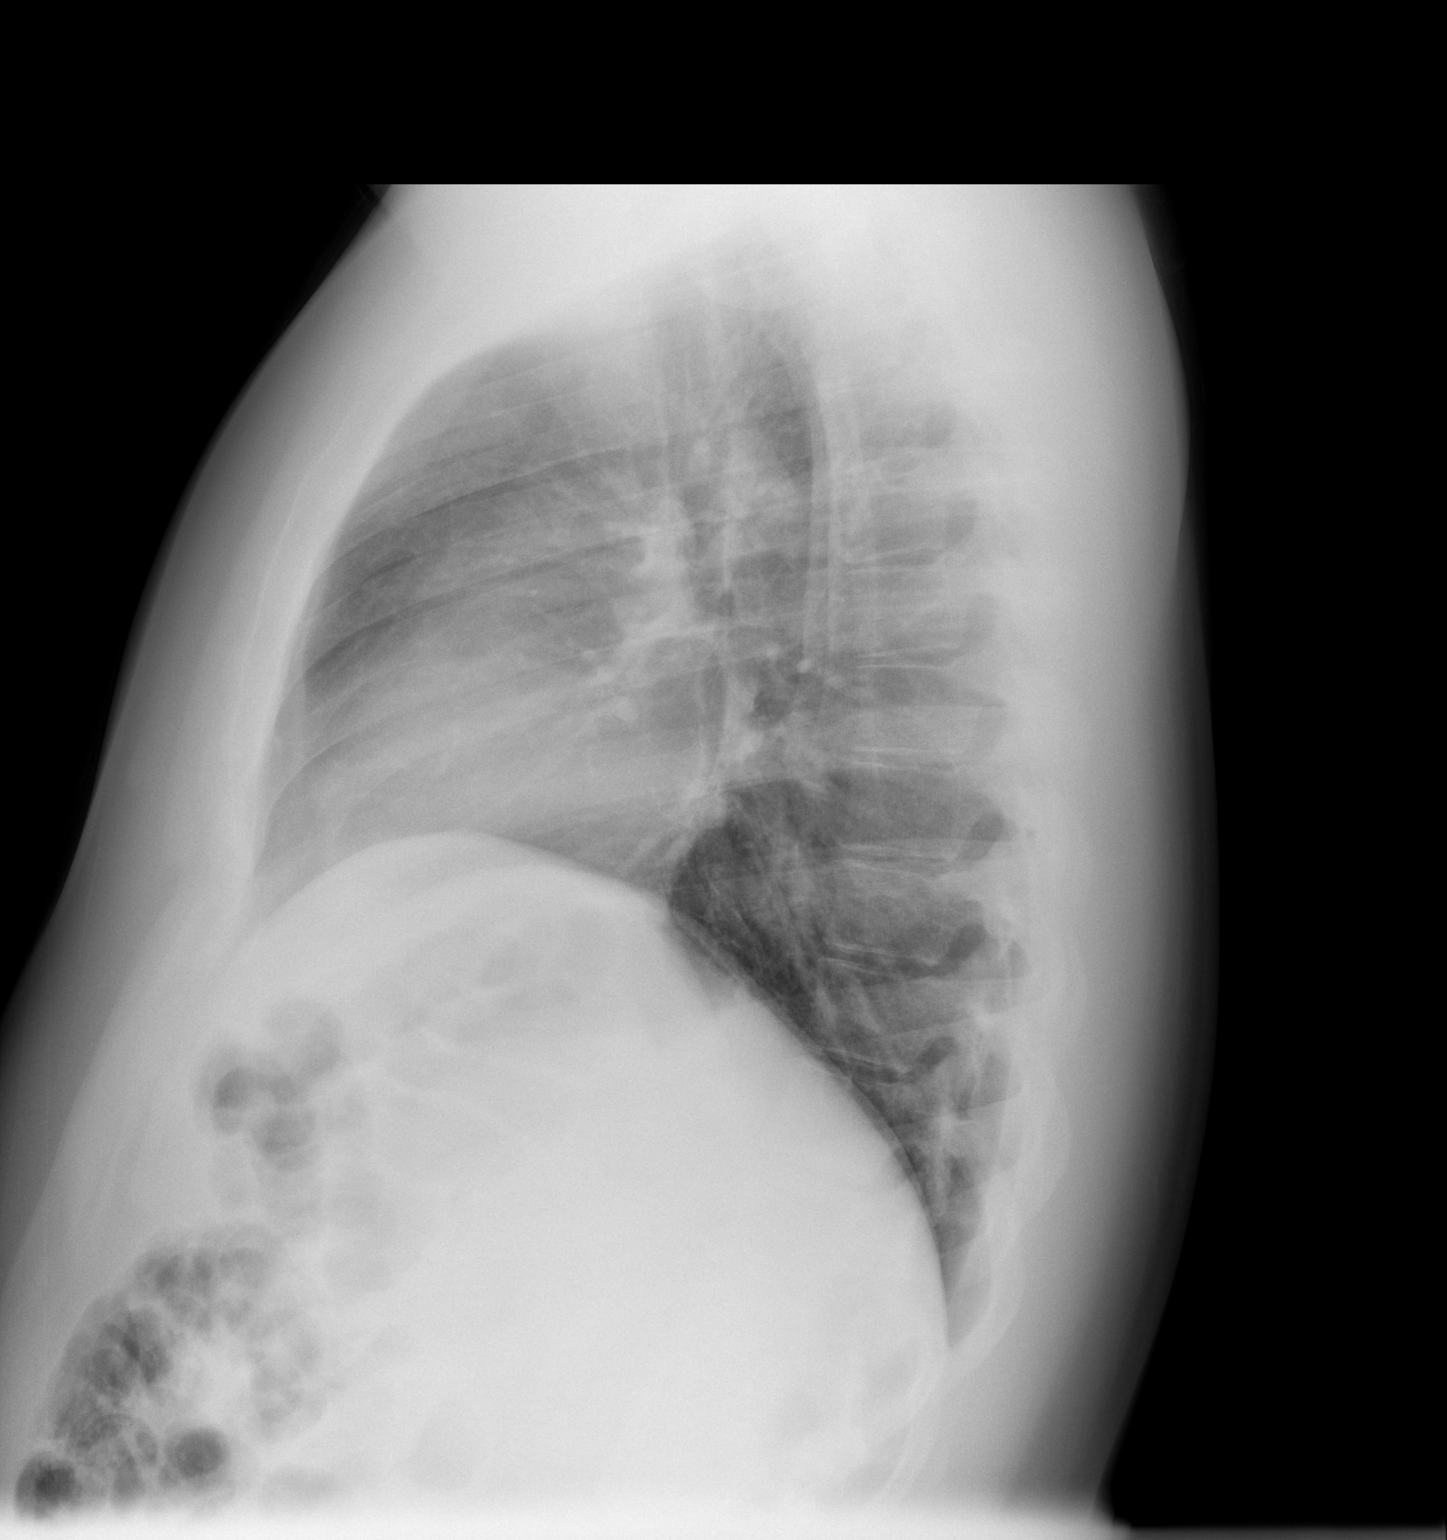

[2 of 2 positions shown; findings below may reference images not displayed]

FINDINGS: The heart size and mediastinal contours are within normal limits.
Both lungs are clear. No pneumothorax or pleural effusion is noted.
The visualized skeletal structures are unremarkable.
IMPRESSION: No acute cardiopulmonary abnormality seen.

## 2015-05-10 MED ORDER — METHOCARBAMOL 500 MG PO TABS
500.0000 mg | ORAL_TABLET | Freq: Three times a day (TID) | ORAL | Status: DC | PRN
Start: 2015-05-10 — End: 2015-11-24

## 2015-05-10 MED FILL — METHOCARBAMOL 500 MG TABLET: 500 | 10 days supply | Qty: 30 | Fill #0

## 2015-05-10 NOTE — Patient Instructions (Signed)
I will send you your x-ray reports on your mychart Try the robaxin as needed but remember it can make you sleepy!   Let me know if you are not feeling better soon!

## 2015-05-10 NOTE — Progress Notes (Signed)
Pre visit review using our clinic review tool, if applicable. No additional management support is needed unless otherwise documented below in the visit note. 

## 2015-05-10 NOTE — Progress Notes (Signed)
[place] [facility] [address] [place], [place] [address] [id number] [phone]  Date:  05/10/2015   Name:  Nicholas Robertson   DOB:  [birth year]   MRN:  [id number]  PCP:  Penni Homans, MD    Chief Complaint: No chief complaint on file.   History of Present Illness:  Nicholas Robertson is a 30 y.o. very pleasant male patient who presents with the following:  Here today with intermittent back pain for about 2 weeks. It started with a feeling like pressure in the left lower back- it would ache with more movement and as the day went on.   He did have a couple of episodes where pain would go down the left leg.  However these were short lived and now resolved.  He has a hard time getting comfortable- it can be worse when he lies down at night. Changing position frequently does help No prior history of back issues No MVA, no overuse injuries or any new activities  He does feel like he has a little urinary hesitancey- his urine dip was normal however.  No dysuria, no perineal pain.   No constipation.   He does not have any weakness of his legs.  He can get a little numb in his groin if he sits too long on the toilet seat but this goes away when he gets up and moves.   Otherwise no weakness or numbness in his legs   He is generally in good health. He does have a history of WPW- he uses toprol for HR and BP and is s/p ablation  He has not taken his toprol yet today which is probably why his BP is up  Patient Active Problem List   Diagnosis Date Noted  . Diarrhea 07/31/2013  . CMV (cytomegalovirus infection) ([place]) 07/31/2013  . Anxiety and depression 04/18/2013  . Preventative health care 04/18/2013  . 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 Wolff-Parkinson-White (WPW) syndrome 02/17/2013  . HTN (hypertension) 02/07/2013  . Other and unspecified hyperlipidemia 02/07/2013  . Abnormal LFTs 02/07/2013    Past Medical History   Diagnosis Date  . Strep pharyngitis 10/20/2012  . HTN (hypertension)    . Other and unspecified hyperlipidemia    . Abnormal LFTs    . WPW (Wolff-Parkinson-White syndrome)      s/p ablation 02/2013 by [name]  . Preventative health care 04/18/2013  . Fever, unspecified 07/31/2013    Past Surgical History  Procedure Laterality Date  . Ablation  03/09/2013    EPS and RFCA of manifest right posteroloateral accessory pathway  . Supraventricular tachycardia ablation N/A 03/08/2013    Procedure: WPW ABLATION;  Surgeon: [name], MD;  Location: [facility] CATH LAB;  Service: Cardiovascular;  Laterality: N/A;    Social History  Substance Use Topics  . Smoking status: Never Smoker   . Smokeless tobacco: Never Used  . Alcohol Use: No    Family History  Problem Relation Age of Onset  . Hypertension Mother   . Hyperlipidemia Mother   . Mental illness Mother     anxiety  . Other Mother     glucose intolerant  . Diabetes Father     2  . Hypertension Father   . Psoriasis Maternal Grandmother   . Hypertension Maternal Grandmother   . Hyperlipidemia Maternal Grandfather   . Heart disease Maternal Grandfather     cabg  . Hypertension Maternal Grandfather   .  Polymyalgia rheumatica Maternal Grandfather   . Hypertension Paternal Grandmother   . Diabetes Paternal Grandmother     2  . COPD Paternal Grandmother   . Heart disease Paternal Grandfather     s/p MI, s/p CABG  . Hyperlipidemia Paternal Grandfather   . Hypertension Paternal Grandfather   . Diabetes Paternal Grandfather     1    No Known Allergies  Medication list has been reviewed and updated.  Current Outpatient Prescriptions on File Prior to Visit  Medication Sig Dispense Refill  . amLODipine (NORVASC) 5 MG tablet Take 1 tablet (5 mg total) by mouth daily. 90 tablet 1  . fluticasone (FLONASE) 50 MCG/ACT nasal spray Place 2 sprays into both nostrils daily as needed for allergies or rhinitis. 16 g 6  . KRILL OIL PO Take 1  tablet by mouth daily.    Marland Kitchen triamcinolone cream (KENALOG) 0.1 % Apply 1 application topically 2 (two) times daily. 30 g 1  . metoprolol succinate (TOPROL XL) 50 MG 24 hr tablet Take 1 tablet (50 mg total) by mouth daily. Take with or immediately following a meal. (Patient not taking: Reported on 05/10/2015) 30 tablet 5   No current facility-administered medications on file prior to visit.    Review of Systems:  As per HPI- otherwise negative.   Physical Examination: Filed Vitals:   05/10/15 1518  BP: 156/104  Pulse: 86  Temp: 98.5 F (36.9 C)  Resp: 18   Filed Vitals:   05/10/15 1518  Height: 5\' 10"  (1.778 m)   There is no weight on file to calculate BMI. Ideal Body Weight: Weight in (lb) to have BMI = 25: 173.9  GEN: WDWN, NAD, Non-toxic, A & O x 3, overweight, looks well HEENT: Atraumatic, Normocephalic. Neck supple. No masses, No LAD. Ears and Nose: No external deformity. CV: RRR, No M/G/R. No JVD. No thrill. No extra heart sounds. PULM: CTA B, no wheezes, crackles, rhonchi. No retractions. No resp. distress. No accessory muscle use. ABD: S, NT, ND, +BS. No rebound. No HSM.  Benign belly EXTR: No c/c/e NEURO Normal gait.  PSYCH: Normally interactive. Conversant. Not depressed or anxious appearing.  Calm demeanor.  Tightness and tenderness in the lumbar paraspinous muscles.  Normal ROM of the lumbar spine.  Milder tenderness over the right piriformis/ sciatic notch Normal BLE strength, sensation, DTR and SLR.    Dg Lumbar Spine Complete  05/10/2015  CLINICAL DATA:  Lower back pain for 2 weeks with no injury or radiculopathy. EXAM: LUMBAR SPINE - COMPLETE 4+ VIEW COMPARISON:  08/08/2008 FINDINGS: Presumed rudimentary rib on the right at L1. There is no evidence of lumbar spine fracture. Alignment is normal. Intervertebral disc spaces are maintained. IMPRESSION: Negative. Electronically Signed   By: Monte Fantasia M.D.   On: 05/10/2015 16:48    Assessment and Plan: Lumbar  strain, initial encounter - Plan: DG Lumbar Spine Complete, methocarbamol (ROBAXIN) 500 MG tablet  Here today with a lumbar strain and pain for about 2 weeks X-rays of lumbar spine are normal today Reassurance, plan to try robaxin as needed He will let me know if not feeling better in the next week or so- Sooner if worse.     Signed Lamar Blinks, MD

## 2015-05-18 DIAGNOSIS — D225 Melanocytic nevi of trunk: Secondary | ICD-10-CM | POA: Diagnosis not present

## 2015-05-19 ENCOUNTER — Ambulatory Visit (INDEPENDENT_AMBULATORY_CARE_PROVIDER_SITE_OTHER): Payer: 59 | Admitting: Psychology

## 2015-05-19 ENCOUNTER — Telehealth: Payer: Self-pay | Admitting: Family Medicine

## 2015-05-19 DIAGNOSIS — F4323 Adjustment disorder with mixed anxiety and depressed mood: Secondary | ICD-10-CM

## 2015-05-19 MED ORDER — SERTRALINE HCL 50 MG PO TABS: 50.0000 mg | ORAL_TABLET | Freq: Every day | ORAL | Status: DC

## 2015-05-19 MED FILL — SERTRALINE HCL 50 MG TABLET: 50 | 30 days supply | Qty: 30 | Fill #0

## 2015-05-19 NOTE — Telephone Encounter (Signed)
Spoke with patient and he is actively working on Anxiety and stressors with them. It is agreed we will add Sertraline 25 mg po daily x 1-2 weeks then increase to 50 mg daily and follow up in 2-3 months or as needed, rx sent to [place]

## 2015-05-25 DIAGNOSIS — L739 Follicular disorder, unspecified: Secondary | ICD-10-CM | POA: Diagnosis not present

## 2015-05-25 DIAGNOSIS — L723 Sebaceous cyst: Secondary | ICD-10-CM | POA: Diagnosis not present

## 2015-06-02 ENCOUNTER — Ambulatory Visit: Payer: 59 | Admitting: Psychology

## 2015-06-02 DIAGNOSIS — D485 Neoplasm of uncertain behavior of skin: Secondary | ICD-10-CM | POA: Diagnosis not present

## 2015-06-02 DIAGNOSIS — A630 Anogenital (venereal) warts: Secondary | ICD-10-CM | POA: Diagnosis not present

## 2015-06-12 DIAGNOSIS — D485 Neoplasm of uncertain behavior of skin: Secondary | ICD-10-CM | POA: Diagnosis not present

## 2015-06-16 ENCOUNTER — Ambulatory Visit: Payer: 59 | Admitting: Psychology

## 2015-06-30 ENCOUNTER — Ambulatory Visit: Payer: 59 | Admitting: Psychology

## 2015-07-05 ENCOUNTER — Ambulatory Visit (INDEPENDENT_AMBULATORY_CARE_PROVIDER_SITE_OTHER): Payer: 59 | Admitting: Psychology

## 2015-07-05 DIAGNOSIS — F4323 Adjustment disorder with mixed anxiety and depressed mood: Secondary | ICD-10-CM | POA: Diagnosis not present

## 2015-08-03 ENCOUNTER — Ambulatory Visit (INDEPENDENT_AMBULATORY_CARE_PROVIDER_SITE_OTHER): Payer: 59 | Admitting: Psychology

## 2015-08-03 DIAGNOSIS — F4323 Adjustment disorder with mixed anxiety and depressed mood: Secondary | ICD-10-CM | POA: Diagnosis not present

## 2015-08-11 ENCOUNTER — Other Ambulatory Visit: Payer: Self-pay | Admitting: Family Medicine

## 2015-08-11 MED ORDER — TAMSULOSIN HCL 0.4 MG PO CAPS: 0.4000 mg | ORAL_CAPSULE | Freq: Every day | ORAL | Status: DC

## 2015-08-11 MED ORDER — CIPROFLOXACIN HCL 500 MG PO TABS: 500.0000 mg | ORAL_TABLET | Freq: Two times a day (BID) | ORAL | Status: DC

## 2015-08-11 MED FILL — CIPROFLOXACIN HCL 500 MG TA: 500 | 14 days supply | Qty: 28 | Fill #0

## 2015-08-11 MED FILL — TAMSULOSIN HCL 0.4 MG CAP: 0.4 | 30 days supply | Qty: 30 | Fill #0

## 2015-08-11 NOTE — Progress Notes (Signed)
Patient with worsening urinary hesitancy and weak stream. No dysuria, hematuria or fevers. Will treat for a cute prostatitis with Ciprofloxacin if no response then will start Flomax and already has an appt scheduled with urology next month.

## 2015-08-12 ENCOUNTER — Encounter (HOSPITAL_BASED_OUTPATIENT_CLINIC_OR_DEPARTMENT_OTHER): Payer: Self-pay | Admitting: *Deleted

## 2015-08-12 ENCOUNTER — Emergency Department (HOSPITAL_BASED_OUTPATIENT_CLINIC_OR_DEPARTMENT_OTHER)
Admission: EM | Admit: 2015-08-12 | Discharge: 2015-08-12 | Disposition: A | Discharge status: Home / Self Care | Payer: 59 | Attending: Emergency Medicine | Admitting: Emergency Medicine

## 2015-08-12 DIAGNOSIS — R339 Retention of urine, unspecified: Secondary | ICD-10-CM | POA: Diagnosis present

## 2015-08-12 DIAGNOSIS — Z79899 Other long term (current) drug therapy: Secondary | ICD-10-CM | POA: Insufficient documentation

## 2015-08-12 DIAGNOSIS — I10 Essential (primary) hypertension: Secondary | ICD-10-CM | POA: Diagnosis not present

## 2015-08-12 DIAGNOSIS — N419 Inflammatory disease of prostate, unspecified: Secondary | ICD-10-CM | POA: Insufficient documentation

## 2015-08-12 LAB — URINALYSIS, ROUTINE W REFLEX MICROSCOPIC
BILIRUBIN URINE: NEGATIVE
GLUCOSE, UA: NEGATIVE mg/dL
HGB URINE DIPSTICK: NEGATIVE
Ketones, ur: NEGATIVE mg/dL
Leukocytes, UA: NEGATIVE
Nitrite: NEGATIVE
PROTEIN: NEGATIVE mg/dL
SPECIFIC GRAVITY, URINE: 1.021 (ref 1.005–1.030)
pH: 7 (ref 5.0–8.0)

## 2015-08-12 LAB — BASIC METABOLIC PANEL
ANION GAP: 7 (ref 5–15)
BUN: 11 mg/dL (ref 6–20)
CO2: 24 mmol/L (ref 22–32)
Calcium: 9.1 mg/dL (ref 8.9–10.3)
Chloride: 106 mmol/L (ref 101–111)
Creatinine, Ser: 0.78 mg/dL (ref 0.61–1.24)
GFR calc Af Amer: >60 mL/min (ref >60)
Glucose, Bld: 107 mg/dL — ABNORMAL HIGH (ref 65–99)
POTASSIUM: 3.7 mmol/L (ref 3.5–5.1)
Sodium: 137 mmol/L (ref 135–145)

## 2015-08-12 NOTE — ED Notes (Addendum)
Patient c/o dificulty to urinate for the past two days, lower back pain, no blood in urine that he is aware. Abd pressure

## 2015-08-12 NOTE — Discharge Instructions (Signed)

## 2015-08-12 NOTE — ED Notes (Signed)
41cc urine measured with bladder scanner

## 2015-08-12 NOTE — ED Notes (Signed)
MD at bedside. 

## 2015-08-12 NOTE — ED Provider Notes (Signed)
CSN: [id number]     Arrival date & time 08/12/15  0807 History   First MD Initiated Contact with Patient 08/12/15 [phone]     Chief Complaint  Patient presents with  . Urinary Retention      The history is provided by the patient.  Patient presents with urinary hesitancy and some difficulty with stream. He's had for the last few days. His been treated by his primary care doctor and was started on Cipro and has had one day of it. States it is gotten worse. States he feels as if he is not urinating as much. Feels as if it may be retaining. Slight lower abdominal pressure that goes to the back and also the perineal area. No penile discharge. States he has been in a monogamous relationship for 2 years. He is a PA in the medical office upstairs.   Past Medical History  Diagnosis Date  . Strep pharyngitis 10/20/2012  . HTN (hypertension)    . Other and unspecified hyperlipidemia    . Abnormal LFTs    . WPW (Wolff-Parkinson-White syndrome)      s/p ablation 02/2013 by [name]  . Preventative health care 04/18/2013  . Fever, unspecified 07/31/2013   Past Surgical History  Procedure Laterality Date  . Ablation  03/09/2013    EPS and RFCA of manifest right posteroloateral accessory pathway  . Supraventricular tachycardia ablation N/A 03/08/2013    Procedure: WPW ABLATION;  Surgeon: [name], MD;  Location: [facility] CATH LAB;  Service: Cardiovascular;  Laterality: N/A;   Family History  Problem Relation Age of Onset  . Hypertension Mother   . Hyperlipidemia Mother   . Mental illness Mother     anxiety  . Other Mother     glucose intolerant  . Diabetes Father     2  . Hypertension Father   . Psoriasis Maternal Grandmother   . Hypertension Maternal Grandmother   . Hyperlipidemia Maternal Grandfather   . Heart disease Maternal Grandfather     cabg  . Hypertension Maternal Grandfather   . Polymyalgia rheumatica Maternal Grandfather   . Hypertension Paternal Grandmother   . Diabetes Paternal  Grandmother     2  . COPD Paternal Grandmother   . Heart disease Paternal Grandfather     s/p MI, s/p CABG  . Hyperlipidemia Paternal Grandfather   . Hypertension Paternal Grandfather   . Diabetes Paternal Grandfather     1   Social History  Substance Use Topics  . Smoking status: Never Smoker   . Smokeless tobacco: Never Used  . Alcohol Use: No    Review of Systems  Constitutional: Negative for activity change and appetite change.  Eyes: Negative for pain.  Respiratory: Negative for chest tightness and shortness of breath.   Cardiovascular: Negative for chest pain and leg swelling.  Gastrointestinal: Negative for nausea, vomiting, abdominal pain and diarrhea.  Genitourinary: Positive for decreased urine volume and difficulty urinating. Negative for dysuria, hematuria, flank pain, penile swelling, enuresis, genital sores and testicular pain.  Musculoskeletal: Negative for back pain and neck stiffness.  Skin: Negative for rash.  Neurological: Negative for weakness, numbness and headaches.  Psychiatric/Behavioral: Negative for behavioral problems.      Allergies  Review of patient's allergies indicates no known allergies.  Home Medications   Prior to Admission medications   Medication Sig Start Date End Date Taking? Authorizing Provider  ciprofloxacin (CIPRO) 500 MG tablet Take 1 tablet (500 mg total) by mouth 2 (two) times daily.  08/11/15   Mosie Lukes, MD  fluticasone (FLONASE) 50 MCG/ACT nasal spray Place 2 sprays into both nostrils daily as needed for allergies or rhinitis. 07/29/14   Mosie Lukes, MD  KRILL OIL PO Take 1 tablet by mouth daily.    Historical Provider, MD  methocarbamol (ROBAXIN) 500 MG tablet Take 1 tablet (500 mg total) by mouth every 8 (eight) hours as needed for muscle spasms. 05/10/15   Gay Filler Copland, MD  metoprolol succinate (TOPROL XL) 50 MG 24 hr tablet Take 1 tablet (50 mg total) by mouth daily. Take with or immediately following a  meal. Patient not taking: Reported on 05/10/2015 08/21/14   Mosie Lukes, MD  sertraline (ZOLOFT) 50 MG tablet Take 1 tablet (50 mg total) by mouth daily. 05/19/15   Mosie Lukes, MD  tamsulosin (FLOMAX) 0.4 MG CAPS capsule Take 1 capsule (0.4 mg total) by mouth daily. 08/11/15   Mosie Lukes, MD  triamcinolone cream (KENALOG) 0.1 % Apply 1 application topically 2 (two) times daily. 01/18/15   Debbrah Alar, NP   BP 142/104 mmHg  Pulse 76  Temp(Src) 98.4 F (36.9 C) (Oral)  Resp 16  Ht 5\' 9"  (1.753 m)  Wt 220 lb (99.791 kg)  BMI 32.47 kg/m2  SpO2 97% Physical Exam  Constitutional: He appears well-developed.  HENT:  Head: Atraumatic.  Neck: Neck supple.  Cardiovascular: Normal rate.   Pulmonary/Chest: Effort normal.  Abdominal: There is no tenderness.  Musculoskeletal: Normal range of motion.  Neurological: He is alert.  Skin: Skin is warm.    ED Course  Procedures (including critical care time) Labs Review Labs Reviewed  BASIC METABOLIC PANEL - Abnormal; Notable for the following:    Glucose, Bld 107 (*)    All other components within normal limits  URINE CULTURE  URINALYSIS, ROUTINE W REFLEX MICROSCOPIC (NOT AT [facility])    Imaging Review No results found. I have personally reviewed and evaluated these images and lab results as part of my medical decision-making.   EKG Interpretation None      MDM   Final diagnoses:  Prostatitis, unspecified prostatitis type    Patient with likely prostatitis. Feeling of urinary retention but bladder scan showed 41 mL. Good renal function and urine was benign. Will discharge home and he has urology follow-up.    Davonna Belling, MD 08/12/15 [phone]

## 2015-08-14 LAB — URINE CULTURE: CULTURE: NO GROWTH

## 2015-08-28 DIAGNOSIS — A630 Anogenital (venereal) warts: Secondary | ICD-10-CM | POA: Diagnosis not present

## 2015-09-08 ENCOUNTER — Ambulatory Visit: Payer: 59 | Admitting: Psychology

## 2015-10-05 ENCOUNTER — Ambulatory Visit: Payer: 59 | Admitting: Psychology

## 2015-10-27 ENCOUNTER — Ambulatory Visit (INDEPENDENT_AMBULATORY_CARE_PROVIDER_SITE_OTHER): Payer: 59 | Admitting: Psychology

## 2015-10-27 DIAGNOSIS — F4323 Adjustment disorder with mixed anxiety and depressed mood: Secondary | ICD-10-CM

## 2015-11-19 ENCOUNTER — Telehealth: Payer: Self-pay | Admitting: Family Medicine

## 2015-11-19 MED ORDER — LOSARTAN POTASSIUM 50 MG PO TABS
50.0000 mg | ORAL_TABLET | Freq: Every day | ORAL | 3 refills | Status: DC
Start: 2015-11-19 — End: 2016-01-12

## 2015-11-19 NOTE — Telephone Encounter (Signed)
Pt called today indicating HA x2 days.  Has hx of WPW but HR is well controlled on Metoprolol and running 60s-70s.  Checked BP and it was 150-160/80-110.  Denies CP, SOB.  + HAs.  No visual changes.  Plans to see provider at work tomorrow but is asking to start medication.  Will send Losartan 50mg  to [facility] and pt will f/u w/ PCP.  Reviewed supportive care and red flags that should prompt return.  Pt expressed understanding and is in agreement w/ plan.

## 2015-11-22 ENCOUNTER — Other Ambulatory Visit: Payer: Self-pay | Admitting: *Deleted

## 2015-11-22 MED ORDER — METOPROLOL SUCCINATE ER 50 MG PO TB24: 50.0000 mg | ORAL_TABLET | Freq: Every day | ORAL | 5 refills | Status: DC

## 2015-11-22 MED FILL — METOPROLOL SUCC ER 50 MG TA: 50 | 90 days supply | Qty: 90 | Fill #0

## 2015-11-23 DIAGNOSIS — B079 Viral wart, unspecified: Secondary | ICD-10-CM | POA: Diagnosis not present

## 2015-11-24 ENCOUNTER — Encounter: Payer: Self-pay | Admitting: Family Medicine

## 2015-11-24 ENCOUNTER — Ambulatory Visit (INDEPENDENT_AMBULATORY_CARE_PROVIDER_SITE_OTHER): Payer: 59 | Admitting: Family Medicine

## 2015-11-24 ENCOUNTER — Ambulatory Visit (INDEPENDENT_AMBULATORY_CARE_PROVIDER_SITE_OTHER): Payer: 59 | Admitting: Psychology

## 2015-11-24 VITALS — BP 124/82 | HR 80 | Temp 97.8°F

## 2015-11-24 DIAGNOSIS — I10 Essential (primary) hypertension: Secondary | ICD-10-CM | POA: Diagnosis not present

## 2015-11-24 DIAGNOSIS — F418 Other specified anxiety disorders: Secondary | ICD-10-CM

## 2015-11-24 DIAGNOSIS — R7989 Other specified abnormal findings of blood chemistry: Secondary | ICD-10-CM | POA: Diagnosis not present

## 2015-11-24 DIAGNOSIS — F329 Major depressive disorder, single episode, unspecified: Secondary | ICD-10-CM

## 2015-11-24 DIAGNOSIS — I456 Pre-excitation syndrome: Secondary | ICD-10-CM

## 2015-11-24 DIAGNOSIS — Z0000 Encounter for general adult medical examination without abnormal findings: Secondary | ICD-10-CM

## 2015-11-24 DIAGNOSIS — F4323 Adjustment disorder with mixed anxiety and depressed mood: Secondary | ICD-10-CM

## 2015-11-24 DIAGNOSIS — E782 Mixed hyperlipidemia: Secondary | ICD-10-CM | POA: Diagnosis not present

## 2015-11-24 DIAGNOSIS — F419 Anxiety disorder, unspecified: Secondary | ICD-10-CM

## 2015-11-24 DIAGNOSIS — R945 Abnormal results of liver function studies: Secondary | ICD-10-CM

## 2015-11-24 DIAGNOSIS — F32A Depression, unspecified: Secondary | ICD-10-CM

## 2015-11-24 NOTE — Assessment & Plan Note (Signed)
Well controlled, no changes to meds. Encouraged heart healthy diet such as the DASH diet and exercise as tolerated.  °

## 2015-11-24 NOTE — Patient Instructions (Signed)

## 2015-11-24 NOTE — Progress Notes (Signed)
Patient ID: Nicholas Robertson, male   DOB: April 23, 1985, 29 y.o.   MRN: [id number]   Subjective:    Patient ID: Nicholas Robertson, male    DOB: February 06, 1985, 30 y.o.   MRN: [id number]  Chief Complaint  Patient presents with  . Follow-up    HPI Patient is in today for an annual exam. He has been struggling with episodes of elevated blood pressure with headaches and a sense of dis ease. He feels well today and with medication changes his blood pressure is improving. No acute complaints today. Does note he has had some tachycardic episodes to 110-120 as well but no concerning associated symptoms. Denies CP/SOB/HA/congestion/fevers/GI or GU c/o. Taking meds as prescribed  Past Medical History:  Diagnosis Date  . Abnormal LFTs    . Fever, unspecified 07/31/2013  . HTN (hypertension)    . Other and unspecified hyperlipidemia    . Preventative health care 04/18/2013  . Strep pharyngitis 10/20/2012  . WPW (Wolff-Parkinson-White syndrome)     s/p ablation 02/2013 by [name]    Past Surgical History:  Procedure Laterality Date  . ABLATION  03/09/2013   EPS and RFCA of manifest right posteroloateral accessory pathway  . SUPRAVENTRICULAR TACHYCARDIA ABLATION N/A 03/08/2013   Procedure: WPW ABLATION;  Surgeon: [name], MD;  Location: [facility] CATH LAB;  Service: Cardiovascular;  Laterality: N/A;    Family History  Problem Relation Age of Onset  . Hypertension Mother   . Hyperlipidemia Mother   . Mental illness Mother     anxiety  . Other Mother     glucose intolerant  . Diabetes Father     2  . Hypertension Father   . Arthritis Father   . Psoriasis Maternal Grandmother   . Hypertension Maternal Grandmother   . Hyperlipidemia Maternal Grandfather   . Heart disease Maternal Grandfather     cabg  . Hypertension Maternal Grandfather   . Polymyalgia rheumatica Maternal Grandfather   . Hypertension Paternal Grandmother   . Diabetes Paternal Grandmother     2  . COPD Paternal Grandmother   .  Heart disease Paternal Grandfather     s/p MI, s/p CABG  . Hyperlipidemia Paternal Grandfather   . Hypertension Paternal Grandfather   . Diabetes Paternal Grandfather     1    Social History   Social History  . Marital status: Single    Spouse name: N/A  . Number of children: N/A  . Years of education: N/A   Occupational History  . Not on file.   Social History Main Topics  . Smoking status: Never Smoker  . Smokeless tobacco: Never Used  . Alcohol use No  . Drug use: No  . Sexual activity: Yes     Comment: no dietary restricitons, lives alone with cat, seat belts   Other Topics Concern  . Not on file   Social History Narrative   Works at [employer], as [place] with partner   No major dietary restrictions.    Pet: cat, dog       Outpatient Medications Prior to Visit  Medication Sig Dispense Refill  . fluticasone (FLONASE) 50 MCG/ACT nasal spray Place 2 sprays into both nostrils daily as needed for allergies or rhinitis. 16 g 6  . KRILL OIL PO Take 1 tablet by mouth daily.    Marland Kitchen losartan (COZAAR) 50 MG tablet Take 1 tablet (50 mg total) by mouth daily. 30 tablet 3  . metoprolol succinate (  TOPROL XL) 50 MG 24 hr tablet Take 1 tablet (50 mg total) by mouth daily. Take with or immediately following a meal. 30 tablet 5  . sertraline (ZOLOFT) 50 MG tablet Take 1 tablet (50 mg total) by mouth daily. 30 tablet 3  . triamcinolone cream (KENALOG) 0.1 % Apply 1 application topically 2 (two) times daily. 30 g 1  . ciprofloxacin (CIPRO) 500 MG tablet Take 1 tablet (500 mg total) by mouth 2 (two) times daily. 28 tablet 0  . methocarbamol (ROBAXIN) 500 MG tablet Take 1 tablet (500 mg total) by mouth every 8 (eight) hours as needed for muscle spasms. 30 tablet 0  . tamsulosin (FLOMAX) 0.4 MG CAPS capsule Take 1 capsule (0.4 mg total) by mouth daily. 30 capsule 2   No facility-administered medications prior to visit.     No Known Allergies  Review of Systems    Constitutional: Negative for fever.  Eyes: Negative for blurred vision.  Respiratory: Negative for cough and shortness of breath.   Cardiovascular: Negative for chest pain and palpitations.  Gastrointestinal: Negative for vomiting.  Musculoskeletal: Negative for back pain.  Skin: Negative for rash.  Neurological: Positive for headaches. Negative for loss of consciousness.  Psychiatric/Behavioral: Negative for depression. The patient is nervous/anxious.        Objective:    Physical Exam  Constitutional: He is oriented to person, place, and time. He appears well-developed and well-nourished. No distress.  HENT:  Head: Normocephalic and atraumatic.  Eyes: Conjunctivae are normal.  Neck: Normal range of motion. Neck supple. No thyromegaly present.  Cardiovascular: Normal rate, regular rhythm and normal heart sounds.   No murmur heard. Pulmonary/Chest: Effort normal and breath sounds normal. No respiratory distress. He has no wheezes.  Abdominal: Soft. Bowel sounds are normal. He exhibits no mass. There is no tenderness.  Musculoskeletal: Normal range of motion. He exhibits no edema or deformity.  Lymphadenopathy:    He has no cervical adenopathy.  Neurological: He is alert and oriented to person, place, and time.  Skin: Skin is warm and dry. He is not diaphoretic.  Psychiatric: He has a normal mood and affect. His behavior is normal.    BP 124/82 (BP Location: Left Arm, Patient Position: Sitting, Cuff Size: Normal)   Pulse 80   Temp 97.8 F (36.6 C) (Oral)   SpO2 98%  Wt Readings from Last 3 Encounters:  08/12/15 220 lb (99.8 kg)  08/10/14 227 lb 12.8 oz (103.3 kg)  07/27/13 208 lb (94.3 kg)     Lab Results  Component Value Date   WBC 5.9 11/27/2015   HGB 16.5 11/27/2015   HCT 47.3 11/27/2015   PLT 187.0 11/27/2015   GLUCOSE 101 (H) 11/27/2015   CHOL 236 (H) 11/27/2015   TRIG 174.0 (H) 11/27/2015   HDL 51.30 11/27/2015   LDLDIRECT 168.0 08/02/2014   LDLCALC 150  (H) 11/27/2015   ALT 70 (H) 11/27/2015   AST 32 11/27/2015   NA 140 11/27/2015   K 4.1 11/27/2015   CL 105 11/27/2015   CREATININE 0.91 11/27/2015   BUN 12 11/27/2015   CO2 25 11/27/2015   TSH 1.92 11/27/2015   HGBA1C 5.5 08/02/2014    Lab Results  Component Value Date   TSH 1.92 11/27/2015   Lab Results  Component Value Date   WBC 5.9 11/27/2015   HGB 16.5 11/27/2015   HCT 47.3 11/27/2015   MCV 91.8 11/27/2015   PLT 187.0 11/27/2015   Lab Results  Component Value  Date   NA 140 11/27/2015   K 4.1 11/27/2015   CO2 25 11/27/2015   GLUCOSE 101 (H) 11/27/2015   BUN 12 11/27/2015   CREATININE 0.91 11/27/2015   BILITOT 0.6 11/27/2015   ALKPHOS 50 11/27/2015   AST 32 11/27/2015   ALT 70 (H) 11/27/2015   PROT 7.1 11/27/2015   ALBUMIN 4.4 11/27/2015   CALCIUM 9.8 11/27/2015   ANIONGAP 7 08/12/2015   GFR 103.82 11/27/2015   Lab Results  Component Value Date   CHOL 236 (H) 11/27/2015   Lab Results  Component Value Date   HDL 51.30 11/27/2015   Lab Results  Component Value Date   LDLCALC 150 (H) 11/27/2015   Lab Results  Component Value Date   TRIG 174.0 (H) 11/27/2015   Lab Results  Component Value Date   CHOLHDL 5 11/27/2015   Lab Results  Component Value Date   HGBA1C 5.5 08/02/2014       Assessment & Plan:   Problem List Items Addressed This Visit    HTN (hypertension)    Well controlled, no changes to meds. Encouraged heart healthy diet such as the DASH diet and exercise as tolerated.       Relevant Orders   TSH (Completed)   CBC (Completed)   Comprehensive metabolic panel (Completed)   Mixed hyperlipidemia    Encouraged heart healthy diet, increase exercise, avoid trans fats, consider a krill oil cap daily      Abnormal LFTs    Normal on labs today. Minimize simple carbs and stay active.       Wolff-Parkinson-White (WPW) syndrome    No recent symptomatic episodes      Anxiety and depression    Is on Sertraline without any  concerning side effects. Continue same dose.       Preventative health care    Patient encouraged to maintain heart healthy diet, regular exercise, adequate sleep. Consider daily probiotics. Take medications as prescribed       Other Visit Diagnoses    Hyperlipidemia, mixed    -  Primary   Relevant Orders   Lipid panel (Completed)      I have discontinued Mr. Stenglein methocarbamol, tamsulosin, and ciprofloxacin. I am also having him maintain his KRILL OIL PO, fluticasone, triamcinolone cream, sertraline, losartan, and metoprolol succinate.  No orders of the defined types were placed in this encounter.    Penni Homans, MD

## 2015-11-24 NOTE — Assessment & Plan Note (Signed)
Patient encouraged to maintain heart healthy diet, regular exercise, adequate sleep. Consider daily probiotics. Take medications as prescribed 

## 2015-11-24 NOTE — Progress Notes (Signed)
Pre visit review using our clinic review tool, if applicable. No additional management support is needed unless otherwise documented below in the visit note. 

## 2015-11-27 LAB — COMPREHENSIVE METABOLIC PANEL
ALBUMIN: 4.4 g/dL (ref 3.5–5.2)
ALK PHOS: 50 U/L (ref 39–117)
ALT: 70 U/L — ABNORMAL HIGH (ref 0–53)
AST: 32 U/L (ref 0–37)
BUN: 12 mg/dL (ref 6–23)
CHLORIDE: 105 meq/L (ref 96–112)
CO2: 25 mEq/L (ref 19–32)
CREATININE: 0.91 mg/dL (ref 0.40–1.50)
Calcium: 9.8 mg/dL (ref 8.4–10.5)
GFR: 103.82 mL/min (ref >60.00)
GLUCOSE: 101 mg/dL — AB (ref 70–99)
POTASSIUM: 4.1 meq/L (ref 3.5–5.1)
SODIUM: 140 meq/L (ref 135–145)
TOTAL PROTEIN: 7.1 g/dL (ref 6.0–8.3)
Total Bilirubin: 0.6 mg/dL (ref 0.2–1.2)

## 2015-11-27 LAB — CBC
HEMATOCRIT: 47.3 % (ref 39.0–52.0)
Hemoglobin: 16.5 g/dL (ref 13.0–17.0)
MCHC: 35 g/dL (ref 30.0–36.0)
MCV: 91.8 fl (ref 78.0–100.0)
Platelets: 187 10*3/uL (ref 150.0–400.0)
RBC: 5.15 Mil/uL (ref 4.22–5.81)
RDW: 12.8 % (ref 11.5–15.5)
WBC: 5.9 10*3/uL (ref 4.0–10.5)

## 2015-11-27 LAB — LIPID PANEL
CHOL/HDL RATIO: 5
Cholesterol: 236 mg/dL — ABNORMAL HIGH (ref 0–200)
HDL: 51.3 mg/dL (ref >39.00)
LDL CALC: 150 mg/dL — AB (ref 0–99)
NONHDL: 184.41
Triglycerides: 174 mg/dL — ABNORMAL HIGH (ref 0.0–149.0)
VLDL: 34.8 mg/dL (ref 0.0–40.0)

## 2015-11-27 LAB — TSH: TSH: 1.92 u[IU]/mL (ref 0.35–4.50)

## 2015-12-02 NOTE — Assessment & Plan Note (Signed)
Normal on labs today. Minimize simple carbs and stay active.

## 2015-12-02 NOTE — Assessment & Plan Note (Signed)
Is on Sertraline without any concerning side effects. Continue same dose.

## 2015-12-02 NOTE — Assessment & Plan Note (Signed)
No recent symptomatic episodes. 

## 2015-12-02 NOTE — Assessment & Plan Note (Signed)
Encouraged heart healthy diet, increase exercise, avoid trans fats, consider a krill oil cap daily 

## 2015-12-26 ENCOUNTER — Ambulatory Visit: Payer: Self-pay | Admitting: Family Medicine

## 2015-12-28 ENCOUNTER — Ambulatory Visit (INDEPENDENT_AMBULATORY_CARE_PROVIDER_SITE_OTHER): Payer: 59 | Admitting: Psychology

## 2015-12-28 DIAGNOSIS — F4322 Adjustment disorder with anxiety: Secondary | ICD-10-CM | POA: Diagnosis not present

## 2016-01-01 ENCOUNTER — Other Ambulatory Visit: Payer: Self-pay | Admitting: Family Medicine

## 2016-01-01 MED ORDER — BUPROPION HCL ER (XL) 300 MG PO TB24: 300.0000 mg | ORAL_TABLET | Freq: Every day | ORAL | 2 refills | Status: DC

## 2016-01-01 MED ORDER — BUPROPION HCL ER (XL) 150 MG PO TB24: 150.0000 mg | ORAL_TABLET | Freq: Every day | ORAL | 1 refills | Status: DC

## 2016-01-04 ENCOUNTER — Ambulatory Visit: Payer: Self-pay | Admitting: Family Medicine

## 2016-01-12 ENCOUNTER — Other Ambulatory Visit: Payer: Self-pay | Admitting: Family Medicine

## 2016-01-12 MED ORDER — CITALOPRAM HYDROBROMIDE 10 MG PO TABS: 10.0000 mg | ORAL_TABLET | Freq: Every day | ORAL | 3 refills | Status: DC

## 2016-01-12 MED ORDER — LOSARTAN POTASSIUM 50 MG PO TABS: 50.0000 mg | ORAL_TABLET | Freq: Every day | ORAL | 3 refills | Status: DC

## 2016-01-18 ENCOUNTER — Other Ambulatory Visit: Payer: Self-pay | Admitting: Emergency Medicine

## 2016-01-18 MED ORDER — LOSARTAN POTASSIUM 50 MG PO TABS: 50.0000 mg | ORAL_TABLET | Freq: Every day | ORAL | 1 refills | Status: DC

## 2016-01-18 MED FILL — LOSARTAN POTASSIUM 50 MG TA: 50 | 90 days supply | Qty: 90 | Fill #0

## 2016-02-01 ENCOUNTER — Ambulatory Visit: Payer: 59 | Admitting: Psychology

## 2016-02-08 ENCOUNTER — Encounter: Payer: Self-pay | Admitting: Family Medicine

## 2016-02-08 ENCOUNTER — Ambulatory Visit (INDEPENDENT_AMBULATORY_CARE_PROVIDER_SITE_OTHER): Payer: 59 | Admitting: Family Medicine

## 2016-02-08 VITALS — BP 132/86 | HR 95 | Temp 97.9°F | Wt 217.0 lb

## 2016-02-08 DIAGNOSIS — R7989 Other specified abnormal findings of blood chemistry: Secondary | ICD-10-CM

## 2016-02-08 DIAGNOSIS — F32A Depression, unspecified: Secondary | ICD-10-CM

## 2016-02-08 DIAGNOSIS — F418 Other specified anxiety disorders: Secondary | ICD-10-CM | POA: Diagnosis not present

## 2016-02-08 DIAGNOSIS — F419 Anxiety disorder, unspecified: Secondary | ICD-10-CM

## 2016-02-08 DIAGNOSIS — R945 Abnormal results of liver function studies: Secondary | ICD-10-CM

## 2016-02-08 DIAGNOSIS — I10 Essential (primary) hypertension: Secondary | ICD-10-CM

## 2016-02-08 DIAGNOSIS — F329 Major depressive disorder, single episode, unspecified: Secondary | ICD-10-CM

## 2016-02-08 MED ORDER — CITALOPRAM HYDROBROMIDE 20 MG PO TABS: 20.0000 mg | ORAL_TABLET | Freq: Every day | ORAL | 1 refills | Status: DC

## 2016-02-08 MED ORDER — METOPROLOL SUCCINATE ER 50 MG PO TB24: 50.0000 mg | ORAL_TABLET | Freq: Every day | ORAL | 1 refills | Status: DC

## 2016-02-08 MED FILL — CITALOPRAM HBR 20 MG TABLET: 20 | 90 days supply | Qty: 90 | Fill #0

## 2016-02-08 MED FILL — METOPROLOL SUCC ER 50 MG TA: 50 | 90 days supply | Qty: 90 | Fill #0

## 2016-02-08 NOTE — Progress Notes (Signed)
Subjective:    Patient ID: Nicholas Robertson, male    DOB: Aug 15, 1985, 31 y.o.   MRN: [id number]  Chief Complaint  Patient presents with  . Follow-up    HPI Patient is in today for follow up. He is now on Citalopram 20 mg and he thinks he is doing somewhat better on this dose. Still struggles with anxiety but it has improved some with his move. Notes a rare skipped heart beat but no symptomatic palpitations. No recent acute illness. Denies CP/palp/SOB/HA/congestion/fevers/GI or GU c/o. Taking meds as prescribed  Past Medical History:  Diagnosis Date  . Abnormal LFTs    . Fever, unspecified 07/31/2013  . HTN (hypertension)    . Other and unspecified hyperlipidemia    . Preventative health care 04/18/2013  . Strep pharyngitis 10/20/2012  . WPW (Wolff-Parkinson-White syndrome)     s/p ablation 02/2013 by [name]    Past Surgical History:  Procedure Laterality Date  . ABLATION  03/09/2013   EPS and RFCA of manifest right posteroloateral accessory pathway  . SUPRAVENTRICULAR TACHYCARDIA ABLATION N/A 03/08/2013   Procedure: WPW ABLATION;  Surgeon: [name], MD;  Location: [facility] CATH LAB;  Service: Cardiovascular;  Laterality: N/A;    Family History  Problem Relation Age of Onset  . Hypertension Mother   . Hyperlipidemia Mother   . Mental illness Mother     anxiety  . Other Mother     glucose intolerant  . Diabetes Father     2  . Hypertension Father   . Arthritis Father   . Psoriasis Maternal Grandmother   . Hypertension Maternal Grandmother   . Hyperlipidemia Maternal Grandfather   . Heart disease Maternal Grandfather     cabg  . Hypertension Maternal Grandfather   . Polymyalgia rheumatica Maternal Grandfather   . Hypertension Paternal Grandmother   . Diabetes Paternal Grandmother     2  . COPD Paternal Grandmother   . Heart disease Paternal Grandfather     s/p MI, s/p CABG  . Hyperlipidemia Paternal Grandfather   . Hypertension Paternal Grandfather   . Diabetes  Paternal Grandfather     1    Social History   Social History  . Marital status: Single    Spouse name: N/A  . Number of children: N/A  . Years of education: N/A   Occupational History  . Not on file.   Social History Main Topics  . Smoking status: Never Smoker  . Smokeless tobacco: Never Used  . Alcohol use No  . Drug use: No  . Sexual activity: Yes     Comment: no dietary restricitons, lives alone with cat, seat belts   Other Topics Concern  . Not on file   Social History Narrative   Works at [employer], as [place] with partner   No major dietary restrictions.    Pet: cat, dog       Outpatient Medications Prior to Visit  Medication Sig Dispense Refill  . fluticasone (FLONASE) 50 MCG/ACT nasal spray Place 2 sprays into both nostrils daily as needed for allergies or rhinitis. 16 g 6  . KRILL OIL PO Take 1 tablet by mouth daily.    Marland Kitchen losartan (COZAAR) 50 MG tablet Take 1 tablet (50 mg total) by mouth daily. 90 tablet 1  . triamcinolone cream (KENALOG) 0.1 % Apply 1 application topically 2 (two) times daily. 30 g 1  . citalopram (CELEXA) 10 MG tablet Take 1 tablet (10 mg  total) by mouth daily. 30 tablet 3  . metoprolol succinate (TOPROL XL) 50 MG 24 hr tablet Take 1 tablet (50 mg total) by mouth daily. Take with or immediately following a meal. 30 tablet 5   No facility-administered medications prior to visit.     No Known Allergies  Review of Systems  Constitutional: Negative for fever and malaise/fatigue.  HENT: Negative for congestion.   Eyes: Negative for blurred vision.  Respiratory: Negative for cough and shortness of breath.   Cardiovascular: Positive for palpitations. Negative for chest pain and leg swelling.  Gastrointestinal: Negative for vomiting.  Musculoskeletal: Negative for back pain.  Skin: Negative for rash.  Neurological: Negative for loss of consciousness and headaches.  Psychiatric/Behavioral: The patient is nervous/anxious.          Objective:    Physical Exam  Constitutional: He is oriented to person, place, and time. He appears well-developed and well-nourished. No distress.  HENT:  Head: Normocephalic and atraumatic.  Eyes: Conjunctivae are normal.  Neck: Normal range of motion. No thyromegaly present.  Cardiovascular: Normal rate and regular rhythm.   Pulmonary/Chest: Effort normal and breath sounds normal. He has no wheezes.  Abdominal: Soft. Bowel sounds are normal. There is no tenderness.  Musculoskeletal: Normal range of motion. He exhibits no edema or deformity.  Neurological: He is alert and oriented to person, place, and time.  Skin: Skin is warm and dry. He is not diaphoretic.  Psychiatric: He has a normal mood and affect.    BP 132/86 (BP Location: Right Arm, Patient Position: Sitting, Cuff Size: Normal)   Pulse 95   Temp 97.9 F (36.6 C) (Oral)   Wt 217 lb (98.4 kg)   SpO2 95%   BMI 32.05 kg/m  Wt Readings from Last 3 Encounters:  02/08/16 217 lb (98.4 kg)  08/12/15 220 lb (99.8 kg)  08/10/14 227 lb 12.8 oz (103.3 kg)     Lab Results  Component Value Date   WBC 5.9 11/27/2015   HGB 16.5 11/27/2015   HCT 47.3 11/27/2015   PLT 187.0 11/27/2015   GLUCOSE 69 (L) 02/08/2016   CHOL 236 (H) 11/27/2015   TRIG 174.0 (H) 11/27/2015   HDL 51.30 11/27/2015   LDLDIRECT 168.0 08/02/2014   LDLCALC 150 (H) 11/27/2015   ALT 46 02/08/2016   AST 24 02/08/2016   NA 140 02/08/2016   K 3.9 02/08/2016   CL 105 02/08/2016   CREATININE 0.89 02/08/2016   BUN 11 02/08/2016   CO2 28 02/08/2016   TSH 1.92 11/27/2015   HGBA1C 5.5 08/02/2014    Lab Results  Component Value Date   TSH 1.92 11/27/2015   Lab Results  Component Value Date   WBC 5.9 11/27/2015   HGB 16.5 11/27/2015   HCT 47.3 11/27/2015   MCV 91.8 11/27/2015   PLT 187.0 11/27/2015   Lab Results  Component Value Date   NA 140 02/08/2016   K 3.9 02/08/2016   CO2 28 02/08/2016   GLUCOSE 69 (L) 02/08/2016   BUN 11 02/08/2016    CREATININE 0.89 02/08/2016   BILITOT 0.7 02/08/2016   ALKPHOS 48 02/08/2016   AST 24 02/08/2016   ALT 46 02/08/2016   PROT 7.3 02/08/2016   ALBUMIN 4.7 02/08/2016   CALCIUM 9.7 02/08/2016   ANIONGAP 7 08/12/2015   GFR 106.37 02/08/2016   Lab Results  Component Value Date   CHOL 236 (H) 11/27/2015   Lab Results  Component Value Date   HDL 51.30 11/27/2015  Lab Results  Component Value Date   LDLCALC 150 (H) 11/27/2015   Lab Results  Component Value Date   TRIG 174.0 (H) 11/27/2015   Lab Results  Component Value Date   CHOLHDL 5 11/27/2015   Lab Results  Component Value Date   HGBA1C 5.5 08/02/2014      I acted as a Education administrator for Dr. Charlett Blake. Princess, RMA  Assessment & Plan:   Problem List Items Addressed This Visit    HTN (hypertension) - Primary    Well controlled, no changes to meds. Encouraged heart healthy diet such as the DASH diet and exercise as tolerated.       Relevant Medications   metoprolol succinate (TOPROL XL) 50 MG 24 hr tablet   Other Relevant Orders   Comprehensive metabolic panel (Completed)   Abnormal LFTs    Improved with blood check. Encouraged to minimize simple carbs and continue efforts at weight loss.       Anxiety and depression    Feels he is doing better on Citalopram 20 mg daily.         I have discontinued Mr. Sterchi citalopram. I am also having him start on citalopram. Additionally, I am having him maintain his KRILL OIL PO, fluticasone, triamcinolone cream, losartan, and metoprolol succinate.  Meds ordered this encounter  Medications  . citalopram (CELEXA) 20 MG tablet    Sig: Take 1 tablet (20 mg total) by mouth daily.    Dispense:  90 tablet    Refill:  1  . metoprolol succinate (TOPROL XL) 50 MG 24 hr tablet    Sig: Take 1 tablet (50 mg total) by mouth daily. Take with or immediately following a meal.    Dispense:  90 tablet    Refill:  1    CMA served as scribe during this visit. History, Physical and Plan  performed by medical provider. Documentation and orders reviewed and attested to.  Penni Homans, MD

## 2016-02-08 NOTE — Patient Instructions (Signed)
Hypertension Hypertension, commonly called high blood pressure, is when the force of blood pumping through your arteries is too strong. Your arteries are the blood vessels that carry blood from your heart throughout your body. A blood pressure reading consists of a higher number over a lower number, such as 110/72. The higher number (systolic) is the pressure inside your arteries when your heart pumps. The lower number (diastolic) is the pressure inside your arteries when your heart relaxes. Ideally you want your blood pressure below 120/80. Hypertension forces your heart to work harder to pump blood. Your arteries may become narrow or stiff. Having untreated or uncontrolled hypertension can cause heart attack, stroke, kidney disease, and other problems. What increases the risk? Some risk factors for high blood pressure are controllable. Others are not. Risk factors you cannot control include:  Race. You may be at higher risk if you are African American.  Age. Risk increases with age.  Gender. Men are at higher risk than women before age 45 years. After age 65, women are at higher risk than men. Risk factors you can control include:  Not getting enough exercise or physical activity.  Being overweight.  Getting too much fat, sugar, calories, or salt in your diet.  Drinking too much alcohol. What are the signs or symptoms? Hypertension does not usually cause signs or symptoms. Extremely high blood pressure (hypertensive crisis) may cause headache, anxiety, shortness of breath, and nosebleed. How is this diagnosed? To check if you have hypertension, your health care provider will measure your blood pressure while you are seated, with your arm held at the level of your heart. It should be measured at least twice using the same arm. Certain conditions can cause a difference in blood pressure between your right and left arms. A blood pressure reading that is higher than normal on one occasion does  not mean that you need treatment. If it is not clear whether you have high blood pressure, you may be asked to return on a different day to have your blood pressure checked again. Or, you may be asked to monitor your blood pressure at home for 1 or more weeks. How is this treated? Treating high blood pressure includes making lifestyle changes and possibly taking medicine. Living a healthy lifestyle can help lower high blood pressure. You may need to change some of your habits. Lifestyle changes may include:  Following the DASH diet. This diet is high in fruits, vegetables, and whole grains. It is low in salt, red meat, and added sugars.  Keep your sodium intake below 2,300 mg per day.  Getting at least 30-45 minutes of aerobic exercise at least 4 times per week.  Losing weight if necessary.  Not smoking.  Limiting alcoholic beverages.  Learning ways to reduce stress. Your health care provider may prescribe medicine if lifestyle changes are not enough to get your blood pressure under control, and if one of the following is true:  You are 18-59 years of age and your systolic blood pressure is above 140.  You are 60 years of age or older, and your systolic blood pressure is above 150.  Your diastolic blood pressure is above 90.  You have diabetes, and your systolic blood pressure is over 140 or your diastolic blood pressure is over 90.  You have kidney disease and your blood pressure is above 140/90.  You have heart disease and your blood pressure is above 140/90. Your personal target blood pressure may vary depending on your medical   conditions, your age, and other factors. Follow these instructions at home:  Have your blood pressure rechecked as directed by your health care provider.  Take medicines only as directed by your health care provider. Follow the directions carefully. Blood pressure medicines must be taken as prescribed. The medicine does not work as well when you skip  doses. Skipping doses also puts you at risk for problems.  Do not smoke.  Monitor your blood pressure at home as directed by your health care provider. Contact a health care provider if:  You think you are having a reaction to medicines taken.  You have recurrent headaches or feel dizzy.  You have swelling in your ankles.  You have trouble with your vision. Get help right away if:  You develop a severe headache or confusion.  You have unusual weakness, numbness, or feel faint.  You have severe chest or abdominal pain.  You vomit repeatedly.  You have trouble breathing. This information is not intended to replace advice given to you by your health care provider. Make sure you discuss any questions you have with your health care provider. Document Released: 01/14/2005 Document Revised: 06/22/2015 Document Reviewed: 11/06/2012 Elsevier Interactive Patient Education  2017 [employer] Inc.  

## 2016-02-08 NOTE — Progress Notes (Signed)
Pre visit review using our clinic review tool, if applicable. No additional management support is needed unless otherwise documented below in the visit note. 

## 2016-02-09 LAB — COMPREHENSIVE METABOLIC PANEL
ALBUMIN: 4.7 g/dL (ref 3.5–5.2)
ALT: 46 U/L (ref 0–53)
AST: 24 U/L (ref 0–37)
Alkaline Phosphatase: 48 U/L (ref 39–117)
BUN: 11 mg/dL (ref 6–23)
CHLORIDE: 105 meq/L (ref 96–112)
CO2: 28 mEq/L (ref 19–32)
Calcium: 9.7 mg/dL (ref 8.4–10.5)
Creatinine, Ser: 0.89 mg/dL (ref 0.40–1.50)
GFR: 106.37 mL/min (ref >60.00)
Glucose, Bld: 69 mg/dL — ABNORMAL LOW (ref 70–99)
POTASSIUM: 3.9 meq/L (ref 3.5–5.1)
SODIUM: 140 meq/L (ref 135–145)
Total Bilirubin: 0.7 mg/dL (ref 0.2–1.2)
Total Protein: 7.3 g/dL (ref 6.0–8.3)

## 2016-02-12 NOTE — Assessment & Plan Note (Signed)
Feels he is doing better on Citalopram 20 mg daily.

## 2016-02-12 NOTE — Assessment & Plan Note (Signed)
Improved with blood check. Encouraged to minimize simple carbs and continue efforts at weight loss.

## 2016-02-12 NOTE — Assessment & Plan Note (Signed)
Well controlled, no changes to meds. Encouraged heart healthy diet such as the DASH diet and exercise as tolerated.  °

## 2016-03-07 ENCOUNTER — Ambulatory Visit (INDEPENDENT_AMBULATORY_CARE_PROVIDER_SITE_OTHER): Payer: 59 | Admitting: Psychology

## 2016-03-07 DIAGNOSIS — F4323 Adjustment disorder with mixed anxiety and depressed mood: Secondary | ICD-10-CM | POA: Diagnosis not present

## 2016-04-02 ENCOUNTER — Telehealth: Payer: Self-pay | Admitting: General Practice

## 2016-04-02 NOTE — Telephone Encounter (Signed)
Please advise, pt called stating that you had discussed the [place]. Pt would like to begin this. Could you please send to [facility] in [place] [facility].

## 2016-04-03 MED ORDER — CRISABOROLE 2 % EX OINT: 1.0000 "application " | TOPICAL_OINTMENT | Freq: Two times a day (BID) | CUTANEOUS | 3 refills | Status: DC

## 2016-04-03 NOTE — Telephone Encounter (Signed)
Medication filled to pharmacy as requested.   

## 2016-04-03 NOTE — Telephone Encounter (Signed)
Ok for [employer]- apply thin layer BID, disp 60 gram tube

## 2016-04-04 DIAGNOSIS — H527 Unspecified disorder of refraction: Secondary | ICD-10-CM | POA: Diagnosis not present

## 2016-04-04 DIAGNOSIS — H524 Presbyopia: Secondary | ICD-10-CM | POA: Diagnosis not present

## 2016-05-02 ENCOUNTER — Ambulatory Visit (INDEPENDENT_AMBULATORY_CARE_PROVIDER_SITE_OTHER): Payer: 59 | Admitting: Psychology

## 2016-05-02 DIAGNOSIS — F4323 Adjustment disorder with mixed anxiety and depressed mood: Secondary | ICD-10-CM | POA: Diagnosis not present

## 2016-05-09 MED FILL — LOSARTAN POTASSIUM 50 MG TA: 50 | 90 days supply | Qty: 90 | Fill #1

## 2016-05-16 ENCOUNTER — Telehealth: Payer: Self-pay | Admitting: General Practice

## 2016-05-16 ENCOUNTER — Other Ambulatory Visit: Payer: Self-pay | Admitting: Emergency Medicine

## 2016-05-16 MED ORDER — CITALOPRAM HYDROBROMIDE 20 MG PO TABS: 30.0000 mg | ORAL_TABLET | Freq: Every day | ORAL | 1 refills | Status: DC

## 2016-05-16 MED FILL — CITALOPRAM HBR 20 MG TABLET: 20 | 90 days supply | Qty: 135 | Fill #0

## 2016-05-16 NOTE — Telephone Encounter (Signed)
Ok'd increased dose of Citalopram to 30mg  daily and this was filled to [facility].

## 2016-05-16 NOTE — Telephone Encounter (Signed)
Medication filled to pharmacy as requested.   

## 2016-05-16 NOTE — Telephone Encounter (Signed)
Se advise, pt called in wanting to know if he can possibly have his citalopram increased?  Please advise on the new dose and SIG?

## 2016-05-30 DIAGNOSIS — Z8719 Personal history of other diseases of the digestive system: Secondary | ICD-10-CM | POA: Diagnosis not present

## 2016-06-19 ENCOUNTER — Encounter: Payer: Self-pay | Admitting: Family Medicine

## 2016-06-19 MED ORDER — METOPROLOL SUCCINATE ER 50 MG PO TB24: 50.0000 mg | ORAL_TABLET | Freq: Every day | ORAL | 1 refills | Status: DC

## 2016-06-19 MED FILL — METOPROLOL SUCC ER 50 MG TA: 50 | 90 days supply | Qty: 90 | Fill #0

## 2016-07-11 ENCOUNTER — Ambulatory Visit (INDEPENDENT_AMBULATORY_CARE_PROVIDER_SITE_OTHER): Payer: 59 | Admitting: Psychology

## 2016-07-11 DIAGNOSIS — F4322 Adjustment disorder with anxiety: Secondary | ICD-10-CM | POA: Diagnosis not present

## 2016-08-29 ENCOUNTER — Ambulatory Visit (INDEPENDENT_AMBULATORY_CARE_PROVIDER_SITE_OTHER): Payer: 59 | Admitting: Psychology

## 2016-08-29 DIAGNOSIS — F4322 Adjustment disorder with anxiety: Secondary | ICD-10-CM

## 2016-09-12 ENCOUNTER — Encounter: Payer: Self-pay | Admitting: Family Medicine

## 2016-09-12 ENCOUNTER — Ambulatory Visit (INDEPENDENT_AMBULATORY_CARE_PROVIDER_SITE_OTHER): Payer: 59 | Admitting: Family Medicine

## 2016-09-12 DIAGNOSIS — F32A Depression, unspecified: Secondary | ICD-10-CM

## 2016-09-12 DIAGNOSIS — E782 Mixed hyperlipidemia: Secondary | ICD-10-CM | POA: Diagnosis not present

## 2016-09-12 DIAGNOSIS — Z0000 Encounter for general adult medical examination without abnormal findings: Secondary | ICD-10-CM

## 2016-09-12 DIAGNOSIS — F419 Anxiety disorder, unspecified: Secondary | ICD-10-CM

## 2016-09-12 DIAGNOSIS — F329 Major depressive disorder, single episode, unspecified: Secondary | ICD-10-CM | POA: Diagnosis not present

## 2016-09-12 DIAGNOSIS — I10 Essential (primary) hypertension: Secondary | ICD-10-CM | POA: Diagnosis not present

## 2016-09-12 DIAGNOSIS — I456 Pre-excitation syndrome: Secondary | ICD-10-CM | POA: Diagnosis not present

## 2016-09-12 DIAGNOSIS — E538 Deficiency of other specified B group vitamins: Secondary | ICD-10-CM | POA: Insufficient documentation

## 2016-09-12 DIAGNOSIS — Z8489 Family history of other specified conditions: Secondary | ICD-10-CM | POA: Insufficient documentation

## 2016-09-12 HISTORY — DX: Deficiency of other specified B group vitamins: E53.8

## 2016-09-12 LAB — CBC
HEMATOCRIT: 45.9 % (ref 39.0–52.0)
HEMOGLOBIN: 15.6 g/dL (ref 13.0–17.0)
MCHC: 34.1 g/dL (ref 30.0–36.0)
MCV: 95.2 fl (ref 78.0–100.0)
PLATELETS: 178 10*3/uL (ref 150.0–400.0)
RBC: 4.82 Mil/uL (ref 4.22–5.81)
RDW: 12.5 % (ref 11.5–15.5)
WBC: 6.2 10*3/uL (ref 4.0–10.5)

## 2016-09-12 LAB — COMPREHENSIVE METABOLIC PANEL
ALBUMIN: 4.3 g/dL (ref 3.5–5.2)
ALK PHOS: 0 U/L — AB (ref 39–117)
ALT: 48 U/L (ref 0–53)
AST: 24 U/L (ref 0–37)
BILIRUBIN TOTAL: 0.7 mg/dL (ref 0.2–1.2)
BUN: 10 mg/dL (ref 6–23)
CALCIUM: 9.5 mg/dL (ref 8.4–10.5)
CO2: 27 mEq/L (ref 19–32)
Chloride: 103 mEq/L (ref 96–112)
Creatinine, Ser: 0.82 mg/dL (ref 0.40–1.50)
GFR: 116.46 mL/min (ref >60.00)
GLUCOSE: 90 mg/dL (ref 70–99)
POTASSIUM: 3.9 meq/L (ref 3.5–5.1)
Sodium: 137 mEq/L (ref 135–145)
TOTAL PROTEIN: 6.7 g/dL (ref 6.0–8.3)

## 2016-09-12 LAB — VITAMIN B12: Vitamin B-12: 187 pg/mL — ABNORMAL LOW (ref 211–911)

## 2016-09-12 LAB — LIPID PANEL
CHOLESTEROL: 221 mg/dL — AB (ref 0–200)
HDL: 47.8 mg/dL (ref >39.00)
LDL Cholesterol: 149 mg/dL — ABNORMAL HIGH (ref 0–99)
NONHDL: 172.78
Total CHOL/HDL Ratio: 5
Triglycerides: 119 mg/dL (ref 0.0–149.0)
VLDL: 23.8 mg/dL (ref 0.0–40.0)

## 2016-09-12 LAB — TSH: TSH: 2.92 u[IU]/mL (ref 0.35–4.50)

## 2016-09-12 MED ORDER — CITALOPRAM HYDROBROMIDE 20 MG PO TABS: 30.0000 mg | ORAL_TABLET | Freq: Every day | ORAL | 1 refills | Status: DC

## 2016-09-12 MED ORDER — LOSARTAN POTASSIUM 50 MG PO TABS: 50.0000 mg | ORAL_TABLET | Freq: Every day | ORAL | 1 refills | Status: DC

## 2016-09-12 MED FILL — CITALOPRAM HBR 20 MG TABLET: 20 | 90 days supply | Qty: 135 | Fill #0

## 2016-09-12 MED FILL — LOSARTAN POTASSIUM 50 MG TA: 50 | 90 days supply | Qty: 90 | Fill #0

## 2016-09-12 NOTE — Assessment & Plan Note (Addendum)
Doing well on Citalopram, no change to meds, continues with mindfulness work and infrequent counseling with good results

## 2016-09-12 NOTE — Assessment & Plan Note (Signed)
Well controlled, no changes to meds. Encouraged heart healthy diet such as the DASH diet and exercise as tolerated.  °

## 2016-09-12 NOTE — Assessment & Plan Note (Signed)
Check a level today 

## 2016-09-12 NOTE — Progress Notes (Signed)
Subjective:  I acted as a Education administrator for Dr. Charlett Blake. [place], [place]  Patient ID: Nicholas Robertson, male    DOB: 12-Dec-1985, 31 y.o.   MRN: [id number]  No chief complaint on file.   HPI  Patient is in today for an annual exam. Patient is following up on his HTN, hyperlipidemia and other medical concerns. Patient has no acute concerns. No recent febrile illness or acute hospitalizations. Denies CP/SOB/HA/congestion/fevers/GI or GU c/o. Taking meds as prescribed. He is pleased with the Citalopram and is feeling much calmer. Things are going well at his new practice. No recent febrile illness or hospitalization. He does note his pulse and sweating spike faster these days when he exercises. Is trying to exercise more and eat a heart healthy diet at home.   Patient Care Team: Mosie Lukes, MD as PCP - General (Family Medicine)   Past Medical History:  Diagnosis Date  . Abnormal LFTs    . Deficiency of vitamin B12 09/12/2016  . Fever, unspecified 07/31/2013  . HTN (hypertension)    . Other and unspecified hyperlipidemia    . Preventative health care 04/18/2013  . Strep pharyngitis 10/20/2012  . WPW (Wolff-Parkinson-White syndrome)     s/p ablation 02/2013 by [name]    Past Surgical History:  Procedure Laterality Date  . ABLATION  03/09/2013   EPS and RFCA of manifest right posteroloateral accessory pathway  . SUPRAVENTRICULAR TACHYCARDIA ABLATION N/A 03/08/2013   Procedure: WPW ABLATION;  Surgeon: [name], MD;  Location: [facility] CATH LAB;  Service: Cardiovascular;  Laterality: N/A;    Family History  Problem Relation Age of Onset  . Hypertension Mother   . Hyperlipidemia Mother   . Mental illness Mother        anxiety  . Other Mother        glucose intolerant  . Diabetes Father        2  . Hypertension Father   . Arthritis Father   . Psoriasis Maternal Grandmother   . Hypertension Maternal Grandmother   . Hyperlipidemia Maternal Grandfather   . Heart disease Maternal Grandfather         cabg  . Hypertension Maternal Grandfather   . Polymyalgia rheumatica Maternal Grandfather   . Hypertension Paternal Grandmother   . Diabetes Paternal Grandmother        2  . COPD Paternal Grandmother   . Heart disease Paternal Grandfather        s/p MI, s/p CABG  . Hyperlipidemia Paternal Grandfather   . Hypertension Paternal Grandfather   . Diabetes Paternal Grandfather        1    Social History   Social History  . Marital status: Single    Spouse name: N/A  . Number of children: N/A  . Years of education: N/A   Occupational History  . Not on file.   Social History Main Topics  . Smoking status: Never Smoker  . Smokeless tobacco: Never Used  . Alcohol use No  . Drug use: No  . Sexual activity: Yes     Comment: no dietary restricitons, lives alone with cat, seat belts   Other Topics Concern  . Not on file   Social History Narrative   Works at [employer], as [place] with partner   No major dietary restrictions.    Pet: cat, dog       Outpatient Medications Prior to Visit  Medication Sig Dispense Refill  . Crisaborole (EUCRISA)  2 % OINT Apply 1 application topically 2 (two) times daily. Apply a small amount. 60 g 3  . fluticasone (FLONASE) 50 MCG/ACT nasal spray Place 2 sprays into both nostrils daily as needed for allergies or rhinitis. 16 g 6  . KRILL OIL PO Take 1 tablet by mouth daily.    . metoprolol succinate (TOPROL XL) 50 MG 24 hr tablet Take 1 tablet (50 mg total) by mouth daily. Take with or immediately following a meal. 90 tablet 1  . citalopram (CELEXA) 20 MG tablet Take 1.5 tablets (30 mg total) by mouth daily. 135 tablet 1  . losartan (COZAAR) 50 MG tablet Take 1 tablet (50 mg total) by mouth daily. 90 tablet 1  . triamcinolone cream (KENALOG) 0.1 % Apply 1 application topically 2 (two) times daily. 30 g 1   No facility-administered medications prior to visit.     No Known Allergies  Review of Systems  Constitutional: Positive  for malaise/fatigue. Negative for fever.  HENT: Negative for congestion.   Eyes: Negative for blurred vision.  Respiratory: Negative for cough and shortness of breath.   Cardiovascular: Negative for chest pain, palpitations and leg swelling.  Gastrointestinal: Negative for vomiting.  Musculoskeletal: Negative for back pain.  Skin: Negative for rash.  Neurological: Negative for loss of consciousness and headaches.       Objective:    Physical Exam  Constitutional: He is oriented to person, place, and time. He appears well-developed and well-nourished. No distress.  HENT:  Head: Normocephalic and atraumatic.  Eyes: Conjunctivae are normal.  Neck: Normal range of motion. No thyromegaly present.  Cardiovascular: Normal rate and regular rhythm.   Pulmonary/Chest: Effort normal and breath sounds normal. He has no wheezes.  Abdominal: Soft. Bowel sounds are normal. There is no tenderness.  Musculoskeletal: Normal range of motion. He exhibits no edema or deformity.  Neurological: He is alert and oriented to person, place, and time.  Skin: Skin is warm and dry. He is not diaphoretic.  Psychiatric: He has a normal mood and affect.    BP 118/78 (BP Location: Left Arm, Patient Position: Sitting, Cuff Size: Normal)   Pulse (!) 59   Temp 98.1 F (36.7 C) (Oral)   Resp 18   Wt 238 lb 6.4 oz (108.1 kg)   SpO2 97%   BMI 35.21 kg/m  Wt Readings from Last 3 Encounters:  09/12/16 238 lb 6.4 oz (108.1 kg)  02/08/16 217 lb (98.4 kg)  08/12/15 220 lb (99.8 kg)   BP Readings from Last 3 Encounters:  09/12/16 118/78  02/08/16 132/86  11/24/15 124/82     Immunization History  Administered Date(s) Administered  . Influenza Split 11/10/2015  . Influenza Whole 10/08/2012  . Tdap 08/10/2014    Health Maintenance  Topic Date Due  . INFLUENZA VACCINE  08/28/2016  . TETANUS/TDAP  08/09/2024  . HIV Screening  Completed    Lab Results  Component Value Date   WBC 5.9 11/27/2015   HGB  16.5 11/27/2015   HCT 47.3 11/27/2015   PLT 187.0 11/27/2015   GLUCOSE 69 (L) 02/08/2016   CHOL 236 (H) 11/27/2015   TRIG 174.0 (H) 11/27/2015   HDL 51.30 11/27/2015   LDLDIRECT 168.0 08/02/2014   LDLCALC 150 (H) 11/27/2015   ALT 46 02/08/2016   AST 24 02/08/2016   NA 140 02/08/2016   K 3.9 02/08/2016   CL 105 02/08/2016   CREATININE 0.89 02/08/2016   BUN 11 02/08/2016   CO2 28 02/08/2016  TSH 1.92 11/27/2015   HGBA1C 5.5 08/02/2014    Lab Results  Component Value Date   TSH 1.92 11/27/2015   Lab Results  Component Value Date   WBC 5.9 11/27/2015   HGB 16.5 11/27/2015   HCT 47.3 11/27/2015   MCV 91.8 11/27/2015   PLT 187.0 11/27/2015   Lab Results  Component Value Date   NA 140 02/08/2016   K 3.9 02/08/2016   CO2 28 02/08/2016   GLUCOSE 69 (L) 02/08/2016   BUN 11 02/08/2016   CREATININE 0.89 02/08/2016   BILITOT 0.7 02/08/2016   ALKPHOS 48 02/08/2016   AST 24 02/08/2016   ALT 46 02/08/2016   PROT 7.3 02/08/2016   ALBUMIN 4.7 02/08/2016   CALCIUM 9.7 02/08/2016   ANIONGAP 7 08/12/2015   GFR 106.37 02/08/2016   Lab Results  Component Value Date   CHOL 236 (H) 11/27/2015   Lab Results  Component Value Date   HDL 51.30 11/27/2015   Lab Results  Component Value Date   LDLCALC 150 (H) 11/27/2015   Lab Results  Component Value Date   TRIG 174.0 (H) 11/27/2015   Lab Results  Component Value Date   CHOLHDL 5 11/27/2015   Lab Results  Component Value Date   HGBA1C 5.5 08/02/2014         Assessment & Plan:   Problem List Items Addressed This Visit    HTN (hypertension)    Well controlled, no changes to meds. Encouraged heart healthy diet such as the DASH diet and exercise as tolerated.       Relevant Medications   losartan (COZAAR) 50 MG tablet   Other Relevant Orders   CBC   Comprehensive metabolic panel   TSH   Mixed hyperlipidemia    Encouraged heart healthy diet, increase exercise, avoid trans fats, consider a krill oil cap  daily      Relevant Medications   losartan (COZAAR) 50 MG tablet   Other Relevant Orders   Lipid panel   Wolff-Parkinson-White (WPW) syndrome    S/p ablation doing well      Relevant Medications   losartan (COZAAR) 50 MG tablet   Anxiety and depression    Doing well on Citalopram, no change to meds, continues with mindfulness work and infrequent counseling with good results      Preventative health care    Patient encouraged to maintain heart healthy diet, regular exercise, adequate sleep. Consider daily probiotics. Take medications as prescribed.  Check labs. Notes some frustration with deconditioning. With exertion pulse and sweating pick up quickly. Encouraged to get a personal trainer to help him get back in shape with some supervision.       Vitamin B12 deficiency    Check a level today         I have discontinued Mr. Burgher triamcinolone cream. I am also having him maintain his KRILL OIL PO, fluticasone, Crisaborole, metoprolol succinate, citalopram, and losartan.  Meds ordered this encounter  Medications  . citalopram (CELEXA) 20 MG tablet    Sig: Take 1.5 tablets (30 mg total) by mouth daily.    Dispense:  135 tablet    Refill:  1  . losartan (COZAAR) 50 MG tablet    Sig: Take 1 tablet (50 mg total) by mouth daily.    Dispense:  90 tablet    Refill:  1    CMA served as Education administrator during this visit. History, Physical and Plan performed by medical provider. Documentation and orders reviewed and  attested to.  Penni Homans, MD

## 2016-09-12 NOTE — Assessment & Plan Note (Signed)
Encouraged heart healthy diet, increase exercise, avoid trans fats, consider a krill oil cap daily 

## 2016-09-12 NOTE — Assessment & Plan Note (Signed)
S/p ablation doing well

## 2016-09-12 NOTE — Patient Instructions (Signed)
Preventive Care 18-39 Years, Male Preventive care refers to lifestyle choices and visits with your health care provider that can promote health and wellness. What does preventive care include?  A yearly physical exam. This is also called an annual well check.  Dental exams once or twice a year.  Routine eye exams. Ask your health care provider how often you should have your eyes checked.  Personal lifestyle choices, including: ? Daily care of your teeth and gums. ? Regular physical activity. ? Eating a healthy diet. ? Avoiding tobacco and drug use. ? Limiting alcohol use. ? Practicing safe sex. What happens during an annual well check? The services and screenings done by your health care provider during your annual well check will depend on your age, overall health, lifestyle risk factors, and family history of disease. Counseling Your health care provider may ask you questions about your:  Alcohol use.  Tobacco use.  Drug use.  Emotional well-being.  Home and relationship well-being.  Sexual activity.  Eating habits.  Work and work environment.  Screening You may have the following tests or measurements:  Height, weight, and BMI.  Blood pressure.  Lipid and cholesterol levels. These may be checked every 5 years starting at age 20.  Diabetes screening. This is done by checking your blood sugar (glucose) after you have not eaten for a while (fasting).  Skin check.  Hepatitis C blood test.  Hepatitis B blood test.  Sexually transmitted disease (STD) testing.  Discuss your test results, treatment options, and if necessary, the need for more tests with your health care provider. Vaccines Your health care provider may recommend certain vaccines, such as:  Influenza vaccine. This is recommended every year.  Tetanus, diphtheria, and acellular pertussis (Tdap, Td) vaccine. You may need a Td booster every 10 years.  Varicella vaccine. You may need this if you  have not been vaccinated.  HPV vaccine. If you are 26 or younger, you may need three doses over 6 months.  Measles, mumps, and rubella (MMR) vaccine. You may need at least one dose of MMR.You may also need a second dose.  Pneumococcal 13-valent conjugate (PCV13) vaccine. You may need this if you have certain conditions and have not been vaccinated.  Pneumococcal polysaccharide (PPSV23) vaccine. You may need one or two doses if you smoke cigarettes or if you have certain conditions.  Meningococcal vaccine. One dose is recommended if you are age 19-21 years and a first-year college student living in a residence hall, or if you have one of several medical conditions. You may also need additional booster doses.  Hepatitis A vaccine. You may need this if you have certain conditions or if you travel or work in places where you may be exposed to hepatitis A.  Hepatitis B vaccine. You may need this if you have certain conditions or if you travel or work in places where you may be exposed to hepatitis B.  Haemophilus influenzae type b (Hib) vaccine. You may need this if you have certain risk factors.  Talk to your health care provider about which screenings and vaccines you need and how often you need them. This information is not intended to replace advice given to you by your health care provider. Make sure you discuss any questions you have with your health care provider. Document Released: 03/12/2001 Document Revised: 10/04/2015 Document Reviewed: 11/15/2014 Elsevier Interactive Patient Education  2017 Elsevier Inc.  

## 2016-09-12 NOTE — Assessment & Plan Note (Addendum)
Patient encouraged to maintain heart healthy diet, regular exercise, adequate sleep. Consider daily probiotics. Take medications as prescribed.  Check labs. Notes some frustration with deconditioning. With exertion pulse and sweating pick up quickly. Encouraged to get a personal trainer to help him get back in shape with some supervision.

## 2016-09-13 ENCOUNTER — Other Ambulatory Visit: Payer: Self-pay

## 2016-09-13 MED ORDER — VITAMIN B-12 1000 MCG SL SUBL: 1000.0000 ug | SUBLINGUAL_TABLET | Freq: Every day | SUBLINGUAL | 1 refills | Status: DC

## 2016-09-19 ENCOUNTER — Encounter: Payer: Self-pay | Admitting: Family Medicine

## 2016-09-26 ENCOUNTER — Other Ambulatory Visit: Payer: Self-pay | Admitting: Emergency Medicine

## 2016-09-26 DIAGNOSIS — E538 Deficiency of other specified B group vitamins: Secondary | ICD-10-CM

## 2016-10-02 ENCOUNTER — Other Ambulatory Visit: Payer: 59

## 2016-10-02 DIAGNOSIS — E538 Deficiency of other specified B group vitamins: Secondary | ICD-10-CM | POA: Diagnosis not present

## 2016-10-04 ENCOUNTER — Ambulatory Visit (INDEPENDENT_AMBULATORY_CARE_PROVIDER_SITE_OTHER): Payer: 59

## 2016-10-04 DIAGNOSIS — Z23 Encounter for immunization: Secondary | ICD-10-CM

## 2016-10-04 LAB — INTRINSIC FACTOR ANTIBODIES: Intrinsic Factor: NEGATIVE

## 2016-11-11 MED FILL — METOPROLOL SUCC ER 50 MG TA: 50 | 90 days supply | Qty: 90 | Fill #1

## 2016-12-05 ENCOUNTER — Ambulatory Visit: Payer: 59 | Admitting: Psychology

## 2016-12-30 ENCOUNTER — Ambulatory Visit: Payer: 59 | Admitting: Psychology

## 2017-01-01 ENCOUNTER — Other Ambulatory Visit: Payer: Self-pay | Admitting: Emergency Medicine

## 2017-01-01 MED ORDER — LOSARTAN POTASSIUM 50 MG PO TABS: 50.0000 mg | ORAL_TABLET | Freq: Every day | ORAL | 1 refills | Status: DC

## 2017-01-01 MED ORDER — CITALOPRAM HYDROBROMIDE 20 MG PO TABS: 30.0000 mg | ORAL_TABLET | Freq: Every day | ORAL | 1 refills | Status: DC

## 2017-01-01 MED ORDER — BUPROPION HCL ER (XL) 300 MG PO TB24: 300.0000 mg | ORAL_TABLET | Freq: Every day | ORAL | 0 refills | Status: DC

## 2017-01-01 MED FILL — LOSARTAN POTASSIUM 50 MG TA: 50 | 90 days supply | Qty: 90 | Fill #0

## 2017-01-01 MED FILL — CITALOPRAM HBR 20 MG TABLET: 20 | 90 days supply | Qty: 135 | Fill #0

## 2017-03-06 ENCOUNTER — Other Ambulatory Visit: Payer: Self-pay | Admitting: Emergency Medicine

## 2017-03-06 MED ORDER — METOPROLOL SUCCINATE ER 50 MG PO TB24: 50.0000 mg | ORAL_TABLET | Freq: Every day | ORAL | 1 refills | Status: DC

## 2017-03-06 MED FILL — METOPROLOL SUCC ER 50 MG TA: 50 | 90 days supply | Qty: 90 | Fill #0

## 2017-03-10 ENCOUNTER — Emergency Department (HOSPITAL_BASED_OUTPATIENT_CLINIC_OR_DEPARTMENT_OTHER)
Admission: EM | Admit: 2017-03-10 | Discharge: 2017-03-10 | Disposition: A | Discharge status: Home / Self Care | Payer: No Typology Code available for payment source | Attending: Emergency Medicine | Admitting: Emergency Medicine

## 2017-03-10 ENCOUNTER — Encounter (HOSPITAL_BASED_OUTPATIENT_CLINIC_OR_DEPARTMENT_OTHER): Payer: Self-pay | Admitting: Emergency Medicine

## 2017-03-10 ENCOUNTER — Emergency Department (HOSPITAL_BASED_OUTPATIENT_CLINIC_OR_DEPARTMENT_OTHER): Payer: No Typology Code available for payment source

## 2017-03-10 ENCOUNTER — Other Ambulatory Visit: Payer: Self-pay

## 2017-03-10 DIAGNOSIS — Z79899 Other long term (current) drug therapy: Secondary | ICD-10-CM | POA: Diagnosis not present

## 2017-03-10 DIAGNOSIS — I10 Essential (primary) hypertension: Secondary | ICD-10-CM | POA: Insufficient documentation

## 2017-03-10 DIAGNOSIS — I456 Pre-excitation syndrome: Secondary | ICD-10-CM | POA: Insufficient documentation

## 2017-03-10 DIAGNOSIS — R109 Unspecified abdominal pain: Secondary | ICD-10-CM | POA: Diagnosis not present

## 2017-03-10 DIAGNOSIS — R110 Nausea: Secondary | ICD-10-CM | POA: Diagnosis present

## 2017-03-10 DIAGNOSIS — N201 Calculus of ureter: Secondary | ICD-10-CM | POA: Diagnosis not present

## 2017-03-10 LAB — URINALYSIS, ROUTINE W REFLEX MICROSCOPIC
Bilirubin Urine: NEGATIVE
Glucose, UA: NEGATIVE mg/dL
Ketones, ur: NEGATIVE mg/dL
LEUKOCYTES UA: NEGATIVE
NITRITE: NEGATIVE
PROTEIN: NEGATIVE mg/dL
pH: 6 (ref 5.0–8.0)

## 2017-03-10 LAB — CBC WITH DIFFERENTIAL/PLATELET
Basophils Absolute: 0 10*3/uL (ref 0.0–0.1)
Basophils Relative: 0 %
EOS PCT: 2 %
Eosinophils Absolute: 0.1 10*3/uL (ref 0.0–0.7)
HCT: 42 % (ref 39.0–52.0)
HEMOGLOBIN: 15.1 g/dL (ref 13.0–17.0)
LYMPHS PCT: 33 %
Lymphs Abs: 1.8 10*3/uL (ref 0.7–4.0)
MCH: 32.5 pg (ref 26.0–34.0)
MCHC: 36 g/dL (ref 30.0–36.0)
MCV: 90.5 fL (ref 78.0–100.0)
Monocytes Absolute: 0.5 10*3/uL (ref 0.1–1.0)
Monocytes Relative: 10 %
NEUTROS PCT: 55 %
Neutro Abs: 2.9 10*3/uL (ref 1.7–7.7)
PLATELETS: 144 10*3/uL — AB (ref 150–400)
RBC: 4.64 MIL/uL (ref 4.22–5.81)
RDW: 12.1 % (ref 11.5–15.5)
WBC: 5.3 10*3/uL (ref 4.0–10.5)

## 2017-03-10 LAB — URINALYSIS, MICROSCOPIC (REFLEX)

## 2017-03-10 LAB — BASIC METABOLIC PANEL WITH GFR
Anion gap: 10 (ref 5–15)
BUN: 8 mg/dL (ref 6–20)
CO2: 23 mmol/L (ref 22–32)
Calcium: 8.8 mg/dL — ABNORMAL LOW (ref 8.9–10.3)
Chloride: 106 mmol/L (ref 101–111)
Creatinine, Ser: 0.86 mg/dL (ref 0.61–1.24)
GFR calc Af Amer: >60 mL/min
GFR calc non Af Amer: >60 mL/min
Glucose, Bld: 120 mg/dL — ABNORMAL HIGH (ref 65–99)
Potassium: 3.8 mmol/L (ref 3.5–5.1)
Sodium: 139 mmol/L (ref 135–145)

## 2017-03-10 MED ORDER — TAMSULOSIN HCL 0.4 MG PO CAPS: 0.4000 mg | ORAL_CAPSULE | Freq: Every day | ORAL | 0 refills | Status: DC

## 2017-03-10 MED ORDER — TAMSULOSIN HCL 0.4 MG PO CAPS
0.4000 mg | ORAL_CAPSULE | Freq: Once | ORAL | Status: AC
  Administered 2017-03-10: 0.4 mg via ORAL
  Filled 2017-03-10: qty 1

## 2017-03-10 MED ORDER — OXYCODONE-ACETAMINOPHEN 5-325 MG PO TABS: 2.0000 | ORAL_TABLET | ORAL | 0 refills | Status: DC | PRN

## 2017-03-10 MED ORDER — ONDANSETRON 4 MG PO TBDP: 4.0000 mg | ORAL_TABLET | Freq: Three times a day (TID) | ORAL | 0 refills | Status: DC | PRN

## 2017-03-10 MED ORDER — HYDROMORPHONE HCL 1 MG/ML IJ SOLN
1.0000 mg | INTRAMUSCULAR | Status: DC | PRN
  Administered 2017-03-10: 1 mg via INTRAVENOUS
  Filled 2017-03-10 (×2): qty 1

## 2017-03-10 MED ORDER — SODIUM CHLORIDE 0.9 % IV BOLUS (SEPSIS)
500.0000 mL | Freq: Once | INTRAVENOUS | Status: AC
  Administered 2017-03-10: 500 mL via INTRAVENOUS

## 2017-03-10 MED ORDER — ONDANSETRON HCL 4 MG/2ML IJ SOLN
4.0000 mg | Freq: Once | INTRAMUSCULAR | Status: AC
  Administered 2017-03-10: 4 mg via INTRAVENOUS
  Filled 2017-03-10: qty 2

## 2017-03-10 MED ORDER — OXYCODONE-ACETAMINOPHEN 5-325 MG PO TABS
1.0000 | ORAL_TABLET | Freq: Once | ORAL | Status: AC
Start: 2017-03-10 — End: 2017-03-10
  Administered 2017-03-10: 1 via ORAL
  Filled 2017-03-10: qty 1

## 2017-03-10 MED ORDER — KETOROLAC TROMETHAMINE 30 MG/ML IJ SOLN
30.0000 mg | Freq: Once | INTRAMUSCULAR | Status: AC
  Administered 2017-03-10: 30 mg via INTRAVENOUS
  Filled 2017-03-10: qty 1

## 2017-03-10 MED FILL — ONDANSETRON ODT 4 MG TABLET: 4 | 2 days supply | Qty: 6 | Fill #0

## 2017-03-10 MED FILL — OXYCODONE-ACETAMINOPHEN 5-3: 5-325 | 1 days supply | Qty: 6 | Fill #0

## 2017-03-10 MED FILL — TAMSULOSIN HCL 0.4 MG CAP: 0.4 | 7 days supply | Qty: 7 | Fill #0

## 2017-03-10 NOTE — Discharge Instructions (Signed)
Push fluids to stay hydrated. Flomax 1/day until stone passes. Friend for nausea, Percocet for pain. Contact Dr. Pilar Jarvis if not passing within 5-7 days

## 2017-03-10 NOTE — ED Triage Notes (Signed)
C/o R flank pain that started around 0500. Pt also reports nausea. Hx of kidney stones.

## 2017-03-10 NOTE — ED Provider Notes (Signed)
[facility] EMERGENCY DEPARTMENT Provider Note   CSN: [id number] Arrival date & time: 03/10/17  2703     History   Chief Complaint Chief Complaint  Patient presents with  . Flank Pain    HPI Nicholas Robertson is a 32 y.o. male.  Chief complaint is nausea, and right flank pain  HPI: 32 year old male.  History of kidney stones.  Has had previous lithotripsy.  Last time was over 10 years ago.  Nausea earlier this week.  No dysuria or pain.  Awakened this morning with severe right flank pain.  Nausea no vomiting.  No fever.  No gross hematuria.  No anterior abdominal symptoms.  Past Medical History:  Diagnosis Date  . Abnormal LFTs    . Deficiency of vitamin B12 09/12/2016  . Fever, unspecified 07/31/2013  . HTN (hypertension)    . Other and unspecified hyperlipidemia    . Preventative health care 04/18/2013  . Strep pharyngitis 10/20/2012  . WPW (Wolff-Parkinson-White syndrome)     s/p ablation 02/2013 by [name]    Patient Active Problem List   Diagnosis Date Noted  . Vitamin B12 deficiency 09/12/2016  . Anxiety and depression 04/18/2013  . Preventative health care 04/18/2013  . Wolff-Parkinson-White (WPW) syndrome 02/17/2013  . HTN (hypertension) 02/07/2013  . Mixed hyperlipidemia 02/07/2013  . Abnormal LFTs 02/07/2013    Past Surgical History:  Procedure Laterality Date  . ABLATION  03/09/2013   EPS and RFCA of manifest right posteroloateral accessory pathway  . SUPRAVENTRICULAR TACHYCARDIA ABLATION N/A 03/08/2013   Procedure: WPW ABLATION;  Surgeon: [name], MD;  Location: [facility] CATH LAB;  Service: Cardiovascular;  Laterality: N/A;       Home Medications    Prior to Admission medications   Medication Sig Start Date End Date Taking? Authorizing Provider  buPROPion (WELLBUTRIN XL) 300 MG 24 hr tablet Take 1 tablet (300 mg total) by mouth daily. 01/01/17   Midge Minium, MD  citalopram (CELEXA) 20 MG tablet Take 1.5 tablets (30 mg total) by mouth  daily. 01/01/17   Midge Minium, MD  Crisaborole (EUCRISA) 2 % OINT Apply 1 application topically 2 (two) times daily. Apply a small amount. 04/03/16   Midge Minium, MD  Cyanocobalamin (VITAMIN B-12) 1000 MCG SUBL Place 1 tablet (1,000 mcg total) under the tongue daily. 09/13/16   Mosie Lukes, MD  fluticasone (FLONASE) 50 MCG/ACT nasal spray Place 2 sprays into both nostrils daily as needed for allergies or rhinitis. 07/29/14   Mosie Lukes, MD  KRILL OIL PO Take 1 tablet by mouth daily.    [provider]  losartan (COZAAR) 50 MG tablet Take 1 tablet (50 mg total) by mouth daily. 01/01/17   Midge Minium, MD  metoprolol succinate (TOPROL XL) 50 MG 24 hr tablet Take 1 tablet (50 mg total) by mouth daily. Take with or immediately following a meal. 03/06/17   Midge Minium, MD  ondansetron (ZOFRAN ODT) 4 MG disintegrating tablet Take 1 tablet (4 mg total) by mouth every 8 (eight) hours as needed for nausea. 03/10/17   Tanna Furry, MD  oxyCODONE-acetaminophen (PERCOCET/ROXICET) 5-325 MG tablet Take 2 tablets by mouth every 4 (four) hours as needed. 03/10/17   Tanna Furry, MD  tamsulosin (FLOMAX) 0.4 MG CAPS capsule Take 1 capsule (0.4 mg total) by mouth daily. 03/10/17   Tanna Furry, MD    Family History Family History  Problem Relation Age of Onset  . Hypertension Mother   .  Hyperlipidemia Mother   . Mental illness Mother        anxiety  . Other Mother        glucose intolerant  . Diabetes Father        2  . Hypertension Father   . Arthritis Father   . Psoriasis Maternal Grandmother   . Hypertension Maternal Grandmother   . Hyperlipidemia Maternal Grandfather   . Heart disease Maternal Grandfather        cabg  . Hypertension Maternal Grandfather   . Polymyalgia rheumatica Maternal Grandfather   . Hypertension Paternal Grandmother   . Diabetes Paternal Grandmother        2  . COPD Paternal Grandmother   . Heart disease Paternal Grandfather        s/p MI,  s/p CABG  . Hyperlipidemia Paternal Grandfather   . Hypertension Paternal Grandfather   . Diabetes Paternal Grandfather        1    Social History Social History   Tobacco Use  . Smoking status: Never Smoker  . Smokeless tobacco: Never Used  Substance Use Topics  . Alcohol use: No  . Drug use: No     Allergies   Patient has no known allergies.   Review of Systems Review of Systems  Constitutional: Negative for appetite change, chills, diaphoresis, fatigue and fever.  HENT: Negative for mouth sores, sore throat and trouble swallowing.   Eyes: Negative for visual disturbance.  Respiratory: Negative for cough, chest tightness, shortness of breath and wheezing.   Cardiovascular: Negative for chest pain.  Gastrointestinal: Positive for nausea. Negative for abdominal distention, abdominal pain, diarrhea and vomiting.  Endocrine: Negative for polydipsia, polyphagia and polyuria.  Genitourinary: Positive for flank pain. Negative for dysuria, frequency and hematuria.  Musculoskeletal: Negative for gait problem.  Skin: Negative for color change, pallor and rash.  Neurological: Negative for dizziness, syncope, light-headedness and headaches.  Hematological: Does not bruise/bleed easily.  Psychiatric/Behavioral: Negative for behavioral problems and confusion.     Physical Exam Updated Vital Signs BP (!) 146/91   Pulse 63   Temp 98.2 F (36.8 C) (Oral)   Resp 18   Ht 5' 9.5" (1.765 m)   Wt 106.6 kg (235 lb)   SpO2 93%   BMI 34.21 kg/m   Physical Exam General: Awake and alert.  Pacing.  Holding his right flank. HEENT negative Neck negative Cardiovascular normal rate and rhythm. Lungs clear Abdomen soft.  No tenderness. Holds his hand over his right flank.  No tenderness.  Skeletal negative Neuro awake and alert Skin no rash or skin tenderness along the flank. Psychiatric anxious painful  ED Treatments / Results  Labs (all labs ordered are listed, but only  abnormal results are displayed) Labs Reviewed  URINALYSIS, ROUTINE W REFLEX MICROSCOPIC - Abnormal; Notable for the following components:      Result Value   APPearance CLOUDY (*)    Specific Gravity, Urine >1.030 (*)    Hgb urine dipstick LARGE (*)    All other components within normal limits  CBC WITH DIFFERENTIAL/PLATELET - Abnormal; Notable for the following components:   Platelets 144 (*)    All other components within normal limits  BASIC METABOLIC PANEL - Abnormal; Notable for the following components:   Glucose, Bld 120 (*)    Calcium 8.8 (*)    All other components within normal limits  URINALYSIS, MICROSCOPIC (REFLEX) - Abnormal; Notable for the following components:   Bacteria, UA MANY (*)  Squamous Epithelial / LPF 0-5 (*)    All other components within normal limits    EKG  EKG Interpretation None       Radiology Ct Renal Stone Study  Result Date: 03/10/2017 CLINICAL DATA:  BILATERAL flank pain, suspected recurrent kidney stone disease, history hypertension EXAM: CT ABDOMEN AND PELVIS WITHOUT CONTRAST TECHNIQUE: Multidetector CT imaging of the abdomen and pelvis was performed following the standard protocol without IV contrast. Sagittal and coronal MPR images reconstructed from axial data set. Oral contrast was not administered for this indication. COMPARISON:  None. FINDINGS: Lower chest: Lung bases clear Hepatobiliary: Mild fatty infiltration of liver with focal sparing adjacent to gallbladder fossa. Gallbladder and liver otherwise normal appearance Pancreas: Normal appearance Spleen: Normal appearance Adrenals/Urinary Tract: Adrenal glands normal appearance. Mild RIGHT hydronephrosis secondary to a 3 mm RIGHT UPJ calculus. No additional urinary tract calcification. Normal caliber ureters. No LEFT hydronephrosis. Kidneys and bladder otherwise normal appearance. Stomach/Bowel: Normal appendix, retrocecal. Stomach and bowel loops normal appearance for technique  Vascular/Lymphatic: Unremarkable Reproductive: Normal appearance of prostate gland and seminal vesicles Other: No free air, free fluid, or inflammatory process. Tiny umbilical hernia containing fat. Musculoskeletal: Unremarkable IMPRESSION: Mild RIGHT hydronephrosis secondary to a 3 mm RIGHT UPJ calculus. Mild fatty infiltration of liver. Otherwise negative exam. Electronically Signed   By: Lavonia Dana M.D.   On: 03/10/2017 08:11    Procedures Procedures (including critical care time)  Medications Ordered in ED Medications  HYDROmorphone (DILAUDID) injection 1 mg (1 mg Intravenous Given 03/10/17 0744)  sodium chloride 0.9 % bolus 500 mL (0 mLs Intravenous Stopped 03/10/17 0835)  ondansetron (ZOFRAN) injection 4 mg (4 mg Intravenous Given 03/10/17 0732)  tamsulosin (FLOMAX) capsule 0.4 mg (0.4 mg Oral Given 03/10/17 0827)  oxyCODONE-acetaminophen (PERCOCET/ROXICET) 5-325 MG per tablet 1 tablet (1 tablet Oral Given 03/10/17 0827)  ketorolac (TORADOL) 30 MG/ML injection 30 mg (30 mg Intravenous Given 03/10/17 3662)     Initial Impression / Assessment and Plan / ED Course  I have reviewed the triage vital signs and the nursing notes.  Pertinent labs & imaging results that were available during my care of the patient were reviewed by me and considered in my medical decision making (see chart for details).   Flank pain.  Probable stone.  Plan imaging, fluids, pain control, reevaluation.  Final Clinical Impressions(s) / ED Diagnoses   Final diagnoses:  Ureteral stone    ED Discharge Orders        Ordered    oxyCODONE-acetaminophen (PERCOCET/ROXICET) 5-325 MG tablet  Every 4 hours PRN     03/10/17 0821    ondansetron (ZOFRAN ODT) 4 MG disintegrating tablet  Every 8 hours PRN     03/10/17 0821    tamsulosin (FLOMAX) 0.4 MG CAPS capsule  Daily     03/10/17 0821       Tanna Furry, MD 03/10/17 [phone]

## 2017-03-24 ENCOUNTER — Other Ambulatory Visit: Payer: Self-pay | Admitting: General Practice

## 2017-03-24 MED ORDER — OSELTAMIVIR PHOSPHATE 75 MG PO CAPS: 75.0000 mg | ORAL_CAPSULE | Freq: Two times a day (BID) | ORAL | 0 refills | Status: DC

## 2017-03-28 ENCOUNTER — Telehealth: Payer: Self-pay | Admitting: Family Medicine

## 2017-03-28 MED ORDER — DOXYCYCLINE HYCLATE 100 MG PO TABS: 100.0000 mg | ORAL_TABLET | Freq: Two times a day (BID) | ORAL | 0 refills | Status: DC

## 2017-03-28 NOTE — Telephone Encounter (Signed)
Pt indicated today that he is having worsening cough and chest congestion since yesterday afternoon and overnight.  He is recovering from the flu and started Tamiflu Monday.  Is afebrile.  Cough is productive.  + wheezing.  Was able to listen to his lungs today and he has crackles and rhonchi on R side, CTA on L.  Concern for early PNA.  Will start Doxy BID x10 days.  Reviewed supportive care and red flags that should prompt return.

## 2017-03-31 ENCOUNTER — Other Ambulatory Visit: Payer: Self-pay | Admitting: Family Medicine

## 2017-03-31 DIAGNOSIS — N200 Calculus of kidney: Secondary | ICD-10-CM

## 2017-04-03 MED FILL — TAMSULOSIN HCL 0.4 MG CAP: 0.4 | 30 days supply | Qty: 30 | Fill #0

## 2017-04-03 MED FILL — OXYBUTYNIN CHLORIDE 5 MG TA: 5 | 15 days supply | Qty: 30 | Fill #0

## 2017-04-30 MED FILL — LOSARTAN POTASSIUM 50 MG TA: 50 | 90 days supply | Qty: 90 | Fill #1

## 2017-05-08 MED FILL — CITALOPRAM HBR 20 MG TABLET: 20 | 90 days supply | Qty: 135 | Fill #1

## 2017-05-13 ENCOUNTER — Other Ambulatory Visit: Payer: Self-pay | Admitting: Family Medicine

## 2017-05-13 MED ORDER — AMOXICILLIN 875 MG PO TABS: 875.0000 mg | ORAL_TABLET | Freq: Two times a day (BID) | ORAL | 0 refills | Status: AC

## 2017-05-14 ENCOUNTER — Telehealth: Payer: Self-pay | Admitting: Family Medicine

## 2017-05-14 MED ORDER — PREDNISONE 10 MG PO TABS: ORAL_TABLET | ORAL | 0 refills | Status: DC

## 2017-05-14 NOTE — Telephone Encounter (Signed)
Pt was seen yesterday and dx'd w/ ear infection.  He continues to have hacking cough, severe eustachian tube dysfxn, and muffled hearing.  Using antihistamine, nasal steroid, and cough meds w/o relief.  For additional sxs management- will add Prednisone taper.

## 2017-05-22 ENCOUNTER — Other Ambulatory Visit: Payer: Self-pay | Admitting: *Deleted

## 2017-05-22 DIAGNOSIS — H9203 Otalgia, bilateral: Secondary | ICD-10-CM

## 2017-05-22 DIAGNOSIS — H6690 Otitis media, unspecified, unspecified ear: Secondary | ICD-10-CM

## 2017-07-01 ENCOUNTER — Other Ambulatory Visit: Payer: Self-pay | Admitting: Emergency Medicine

## 2017-07-01 MED ORDER — METOPROLOL SUCCINATE ER 50 MG PO TB24: 50.0000 mg | ORAL_TABLET | Freq: Every day | ORAL | 1 refills | Status: DC

## 2017-07-01 MED FILL — METOPROLOL SUCCINATE ER 50: 50 | 90 days supply | Qty: 90 | Fill #0

## 2017-08-20 ENCOUNTER — Other Ambulatory Visit: Payer: Self-pay | Admitting: Emergency Medicine

## 2017-08-20 MED ORDER — LOSARTAN POTASSIUM 50 MG PO TABS: 50.0000 mg | ORAL_TABLET | Freq: Every day | ORAL | 3 refills | Status: DC

## 2017-08-20 MED FILL — LOSARTAN POTASSIUM 50 MG TA: 50 | 90 days supply | Qty: 90 | Fill #0

## 2017-08-21 ENCOUNTER — Encounter: Payer: Self-pay | Admitting: Skilled Nursing Facility1

## 2017-08-21 ENCOUNTER — Encounter: Payer: No Typology Code available for payment source | Attending: Family Medicine | Admitting: Skilled Nursing Facility1

## 2017-08-21 DIAGNOSIS — E639 Nutritional deficiency, unspecified: Secondary | ICD-10-CM

## 2017-08-21 DIAGNOSIS — Z713 Dietary counseling and surveillance: Secondary | ICD-10-CM | POA: Insufficient documentation

## 2017-08-21 NOTE — Patient Instructions (Signed)
-  I will not eat anything past 8:30pm by August 30th 2019  For when the cravings kick in:  -Drink water: sip on it  -Busy keeping: Laundry, dishes, play with animals, walking, volleyball stuff, talk it out with your mate!

## 2017-08-21 NOTE — Progress Notes (Signed)
  Assessment:  Primary concerns today: self referral.   Pt arrives for lower insurance premiums for 3 visits.   Pt states he wants to be healthier. Pt states he sleeps well with in bed at 10:30pm and awake at 6am. Pt states he does cary some stress. Pt states he manages his stress through music and mediation. Pt states his goal for physical activity is get in something every day outside of work at least 150 minutes a week. Pt states after dinner is when he grazes stating this may be stress eating. Pt states he eats until overly full at dinner most times. Pt states his significant other will not change how they eat which poses an extra challenge to change.  Pt states he will call back to make his appt because he is not sure if he needs all 3 visits by september first or not.    MEDICATIONS: See List   DIETARY INTAKE:  Usual eating pattern includes 3 meals and 1-2 snacks per day.  Everyday foods include none stated.  Avoided foods include none stated.    24-hr recall: 7 days a week 3 meals, 7 days a  Week 1-2 snacks  B ( AM):  Banana with greek yogurt or carnation breakfast essentials or protein shake or fast food Snk ( AM): handful of trailmix L ( PM): protein and vegetables or tuna packet and fruit or sub Snk ( PM):  D ( PM): chicken and vegetable and starch Snk ( PM): snacking throughout the night Beverages: water, sweet tea, soda, coffee  Usual physical activity: 90 minutes a week trampoline fitness class1 a week and walking/yard work  Estimated energy needs: 1800 calories 200 g carbohydrates 135 g protein 50 g fat  Progress Towards Goal(s):  In progress.    Intervention:  Nutrition counseling for healthy lifestyle. Goals: -I will not eat anything past 8:30pm by August 30th 2019  For when the cravings kick in:  -Drink water: sip on it  -Busy keeping: Laundry, dishes, play with animals, walking, volleyball stuff, talk it out with your mate! -Speak with your significant  other about what support looks like to you  Teaching Method Utilized:  Visual Auditory Hands on  Handouts given during visit include:  Should I eat?  Barriers to learning/adherence to lifestyle change: none identified   Demonstrated degree of understanding via:  Teach Back   Monitoring/Evaluation:  Dietary intake, exercise,and body weight prn.

## 2017-08-28 ENCOUNTER — Other Ambulatory Visit: Payer: Self-pay | Admitting: General Practice

## 2017-08-28 MED ORDER — CITALOPRAM HYDROBROMIDE 20 MG PO TABS: 30.0000 mg | ORAL_TABLET | Freq: Every day | ORAL | 1 refills | Status: DC

## 2017-08-28 MED FILL — CITALOPRAM HBR 20 MG TABLET: 20 | 90 days supply | Qty: 135 | Fill #0

## 2017-09-16 ENCOUNTER — Encounter: Payer: Self-pay | Admitting: Family Medicine

## 2017-09-16 DIAGNOSIS — I10 Essential (primary) hypertension: Secondary | ICD-10-CM

## 2017-09-16 DIAGNOSIS — R5383 Other fatigue: Secondary | ICD-10-CM

## 2017-09-16 DIAGNOSIS — E538 Deficiency of other specified B group vitamins: Secondary | ICD-10-CM

## 2017-09-16 DIAGNOSIS — E785 Hyperlipidemia, unspecified: Secondary | ICD-10-CM

## 2017-09-17 NOTE — Telephone Encounter (Signed)
Nicholas Bruin do you mind helping me with this?

## 2017-09-18 ENCOUNTER — Encounter: Payer: Self-pay | Admitting: Family Medicine

## 2017-09-18 ENCOUNTER — Ambulatory Visit (INDEPENDENT_AMBULATORY_CARE_PROVIDER_SITE_OTHER): Payer: No Typology Code available for payment source | Admitting: Family Medicine

## 2017-09-18 VITALS — BP 150/96 | HR 82 | Temp 98.1°F | Resp 18 | Ht 69.0 in | Wt 247.0 lb

## 2017-09-18 DIAGNOSIS — R7989 Other specified abnormal findings of blood chemistry: Secondary | ICD-10-CM

## 2017-09-18 DIAGNOSIS — F419 Anxiety disorder, unspecified: Secondary | ICD-10-CM | POA: Diagnosis not present

## 2017-09-18 DIAGNOSIS — R945 Abnormal results of liver function studies: Secondary | ICD-10-CM | POA: Diagnosis not present

## 2017-09-18 DIAGNOSIS — F329 Major depressive disorder, single episode, unspecified: Secondary | ICD-10-CM

## 2017-09-18 DIAGNOSIS — Z0000 Encounter for general adult medical examination without abnormal findings: Secondary | ICD-10-CM | POA: Diagnosis not present

## 2017-09-18 DIAGNOSIS — I456 Pre-excitation syndrome: Secondary | ICD-10-CM

## 2017-09-18 DIAGNOSIS — I10 Essential (primary) hypertension: Secondary | ICD-10-CM | POA: Diagnosis not present

## 2017-09-18 DIAGNOSIS — Z23 Encounter for immunization: Secondary | ICD-10-CM

## 2017-09-18 DIAGNOSIS — E782 Mixed hyperlipidemia: Secondary | ICD-10-CM

## 2017-09-18 DIAGNOSIS — E538 Deficiency of other specified B group vitamins: Secondary | ICD-10-CM

## 2017-09-18 DIAGNOSIS — F32A Depression, unspecified: Secondary | ICD-10-CM

## 2017-09-18 NOTE — Assessment & Plan Note (Signed)
Asymptomatic today. 

## 2017-09-18 NOTE — Assessment & Plan Note (Signed)
Under stress at work and planning a wedding. He feels he is managing well most days

## 2017-09-18 NOTE — Patient Instructions (Addendum)
DASH or MIND diet or Satiety diet    Miracle oil cream at [employer] 18-39 Years, Male Preventive care refers to lifestyle choices and visits with your health care provider that can promote health and wellness. What does preventive care include?  A yearly physical exam. This is also called an annual well check.  Dental exams once or twice a year.  Routine eye exams. Ask your health care provider how often you should have your eyes checked.  Personal lifestyle choices, including: ? Daily care of your teeth and gums. ? Regular physical activity. ? Eating a healthy diet. ? Avoiding tobacco and drug use. ? Limiting alcohol use. ? Practicing safe sex. What happens during an annual well check? The services and screenings done by your health care provider during your annual well check will depend on your age, overall health, lifestyle risk factors, and family history of disease. Counseling Your health care provider may ask you questions about your:  Alcohol use.  Tobacco use.  Drug use.  Emotional well-being.  Home and relationship well-being.  Sexual activity.  Eating habits.  Work and work Statistician.  Screening You may have the following tests or measurements:  Height, weight, and BMI.  Blood pressure.  Lipid and cholesterol levels. These may be checked every 5 years starting at age 64.  Diabetes screening. This is done by checking your blood sugar (glucose) after you have not eaten for a while (fasting).  Skin check.  Hepatitis C blood test.  Hepatitis B blood test.  Sexually transmitted disease (STD) testing.  Discuss your test results, treatment options, and if necessary, the need for more tests with your health care provider. Vaccines Your health care provider may recommend certain vaccines, such as:  Influenza vaccine. This is recommended every year.  Tetanus, diphtheria, and acellular pertussis (Tdap, Td) vaccine. You  may need a Td booster every 10 years.  Varicella vaccine. You may need this if you have not been vaccinated.  HPV vaccine. If you are 57 or younger, you may need three doses over 6 months.  Measles, mumps, and rubella (MMR) vaccine. You may need at least one dose of MMR.You may also need a second dose.  Pneumococcal 13-valent conjugate (PCV13) vaccine. You may need this if you have certain conditions and have not been vaccinated.  Pneumococcal polysaccharide (PPSV23) vaccine. You may need one or two doses if you smoke cigarettes or if you have certain conditions.  Meningococcal vaccine. One dose is recommended if you are age 89-21 years and a first-year college student living in a residence hall, or if you have one of several medical conditions. You may also need additional booster doses.  Hepatitis A vaccine. You may need this if you have certain conditions or if you travel or work in places where you may be exposed to hepatitis A.  Hepatitis B vaccine. You may need this if you have certain conditions or if you travel or work in places where you may be exposed to hepatitis B.  Haemophilus influenzae type b (Hib) vaccine. You may need this if you have certain risk factors.  Talk to your health care provider about which screenings and vaccines you need and how often you need them. This information is not intended to replace advice given to you by your health care provider. Make sure you discuss any questions you have with your health care provider. Document Released: 03/12/2001 Document Revised: 10/04/2015 Document Reviewed: 11/15/2014 Elsevier Interactive Patient Education  2018  [employer].

## 2017-09-18 NOTE — Assessment & Plan Note (Signed)
Check level 

## 2017-09-18 NOTE — Assessment & Plan Note (Signed)
Encouraged heart healthy diet, increase exercise, avoid trans fats, consider a krill oil cap daily 

## 2017-09-18 NOTE — Assessment & Plan Note (Signed)
Minimize simple carbs and check cmp

## 2017-09-18 NOTE — Progress Notes (Signed)
Subjective:  I acted as a Education administrator for Dr. Charlett Blake. [place], [place]  Patient ID: Nicholas Robertson, male    DOB: 1985-03-16, 32 y.o.   MRN: [id number]  No chief complaint on file.   HPI  Patient is in today for an annual exam and follow up on chronic medical concerns such as hypertension, hyperlipidemia, and WPW. No recent WPW symptoms. Only has occasional palpitations without associated symptoms. No recent febrile illness or hospitalizations. He is planning a wedding and doing well but has been under some stress. Denies CP/palp/SOB/HA/congestion/fevers/GI or GU c/o. Taking meds as prescribed. Has noted some rash and flakey skin in ears but no creams have helped thus far or Neutrogena T gel shampoo  Patient Care Team: Mosie Lukes, MD as PCP - General (Family Medicine)   Past Medical History:  Diagnosis Date  . Abnormal LFTs    . Deficiency of vitamin B12 09/12/2016  . Fever, unspecified 07/31/2013  . HTN (hypertension)    . Other and unspecified hyperlipidemia    . Preventative health care 04/18/2013  . Strep pharyngitis 10/20/2012  . WPW (Wolff-Parkinson-White syndrome)     s/p ablation 02/2013 by [name]    Past Surgical History:  Procedure Laterality Date  . ABLATION  03/09/2013   EPS and RFCA of manifest right posteroloateral accessory pathway  . SUPRAVENTRICULAR TACHYCARDIA ABLATION N/A 03/08/2013   Procedure: WPW ABLATION;  Surgeon: [name], MD;  Location: [facility] CATH LAB;  Service: Cardiovascular;  Laterality: N/A;    Family History  Problem Relation Age of Onset  . Hypertension Mother   . Hyperlipidemia Mother   . Mental illness Mother        anxiety  . Other Mother        glucose intolerant  . Diabetes Father        2  . Hypertension Father   . Arthritis Father   . Psoriasis Maternal Grandmother   . Hypertension Maternal Grandmother   . Hyperlipidemia Maternal Grandfather   . Heart disease Maternal Grandfather        cabg  . Hypertension Maternal Grandfather     . Polymyalgia rheumatica Maternal Grandfather   . Hypertension Paternal Grandmother   . Diabetes Paternal Grandmother        2  . COPD Paternal Grandmother   . Heart disease Paternal Grandfather        s/p MI, s/p CABG  . Hyperlipidemia Paternal Grandfather   . Hypertension Paternal Grandfather   . Diabetes Paternal Grandfather        1    Social History   Socioeconomic History  . Marital status: Single    Spouse name: Not on file  . Number of children: Not on file  . Years of education: Not on file  . Highest education level: Not on file  Occupational History  . Not on file  Social Needs  . Financial resource strain: Not on file  . Food insecurity:    Worry: Not on file    Inability: Not on file  . Transportation needs:    Medical: Not on file    Non-medical: Not on file  Tobacco Use  . Smoking status: Never Smoker  . Smokeless tobacco: Never Used  Substance and Sexual Activity  . Alcohol use: No  . Drug use: No  . Sexual activity: Yes    Comment: no dietary restricitons, lives alone with cat, seat belts  Lifestyle  . Physical activity:    Days  per week: Not on file    Minutes per session: Not on file  . Stress: Not on file  Relationships  . Social connections:    Talks on phone: Not on file    Gets together: Not on file    Attends religious service: Not on file    Active member of club or organization: Not on file    Attends meetings of clubs or organizations: Not on file    Relationship status: Not on file  . Intimate partner violence:    Fear of current or ex partner: Not on file    Emotionally abused: Not on file    Physically abused: Not on file    Forced sexual activity: Not on file  Other Topics Concern  . Not on file  Social History Narrative   Works at [employer], as [employer] with partner   No major dietary restrictions.    Pet: cat, dog    Outpatient Medications Prior to Visit  Medication Sig Dispense Refill  . citalopram (CELEXA)  20 MG tablet Take 1.5 tablets (30 mg total) by mouth daily. 135 tablet 1  . Cyanocobalamin (VITAMIN B-12) 1000 MCG SUBL Place 1 tablet (1,000 mcg total) under the tongue daily. 30 each 1  . fluticasone (FLONASE) 50 MCG/ACT nasal spray Place 2 sprays into both nostrils daily as needed for allergies or rhinitis. 16 g 6  . KRILL OIL PO Take 1 tablet by mouth daily.    Marland Kitchen losartan (COZAAR) 50 MG tablet Take 1 tablet (50 mg total) by mouth daily. 90 tablet 3  . metoprolol succinate (TOPROL XL) 50 MG 24 hr tablet Take 1 tablet (50 mg total) by mouth daily. Take with or immediately following a meal. 90 tablet 1  . tamsulosin (FLOMAX) 0.4 MG CAPS capsule Take 1 capsule (0.4 mg total) by mouth daily. 7 capsule 0  . buPROPion (WELLBUTRIN XL) 300 MG 24 hr tablet Take 1 tablet (300 mg total) by mouth daily. 30 tablet 0  . Crisaborole (EUCRISA) 2 % OINT Apply 1 application topically 2 (two) times daily. Apply a small amount. 60 g 3  . doxycycline (VIBRA-TABS) 100 MG tablet Take 1 tablet (100 mg total) by mouth 2 (two) times daily. 20 tablet 0  . ondansetron (ZOFRAN ODT) 4 MG disintegrating tablet Take 1 tablet (4 mg total) by mouth every 8 (eight) hours as needed for nausea. 6 tablet 0  . oseltamivir (TAMIFLU) 75 MG capsule Take 1 capsule (75 mg total) by mouth 2 (two) times daily. 10 capsule 0  . oxyCODONE-acetaminophen (PERCOCET/ROXICET) 5-325 MG tablet Take 2 tablets by mouth every 4 (four) hours as needed. 6 tablet 0  . predniSONE (DELTASONE) 10 MG tablet 3 tabs x3 days and then 2 tabs x3 days and then 1 tab x3 days.  Take w/ food. 18 tablet 0   No facility-administered medications prior to visit.     No Known Allergies  Review of Systems  Constitutional: Negative for fever and malaise/fatigue.  HENT: Negative for congestion.   Eyes: Negative for blurred vision.  Respiratory: Negative for shortness of breath.   Cardiovascular: Negative for chest pain, palpitations and leg swelling.  Gastrointestinal:  Negative for abdominal pain, blood in stool and nausea.  Genitourinary: Negative for dysuria and frequency.  Musculoskeletal: Negative for falls.  Skin: Positive for rash.  Neurological: Negative for dizziness, loss of consciousness and headaches.  Endo/Heme/Allergies: Negative for environmental allergies.  Psychiatric/Behavioral: Negative for depression. The patient is  not nervous/anxious.        Objective:    Physical Exam  Constitutional: He is oriented to person, place, and time. He appears well-developed and well-nourished. No distress.  HENT:  Head: Normocephalic and atraumatic.  Nose: Nose normal.  Eyes: Right eye exhibits no discharge. Left eye exhibits no discharge.  Neck: Normal range of motion. Neck supple.  Cardiovascular: Normal rate, regular rhythm and normal heart sounds.  No murmur heard. Pulmonary/Chest: Effort normal and breath sounds normal. No stridor.  Abdominal: Soft. Bowel sounds are normal. He exhibits no distension and no mass. There is no guarding.  Musculoskeletal: He exhibits no edema.  Neurological: He is alert and oriented to person, place, and time. He displays normal reflexes. No cranial nerve deficit. Coordination normal.  Skin: Skin is warm and dry.  Psychiatric: He has a normal mood and affect.  Nursing note and vitals reviewed.   BP (!) 150/96   Pulse 82   Temp 98.1 F (36.7 C) (Oral)   Resp 18   Ht 5\' 9"  (1.753 m)   Wt 247 lb (112 kg)   SpO2 97%   BMI 36.48 kg/m  Wt Readings from Last 3 Encounters:  09/18/17 247 lb (112 kg)  03/10/17 235 lb (106.6 kg)  09/12/16 238 lb 6.4 oz (108.1 kg)   BP Readings from Last 3 Encounters:  09/18/17 (!) 150/96  03/10/17 (!) 146/91  09/12/16 118/78     Immunization History  Administered Date(s) Administered  . Influenza Split 11/10/2015  . Influenza Whole 10/08/2012  . Influenza,inj,Quad PF,6+ Mos 10/04/2016, 09/18/2017  . Tdap 08/10/2014    Health Maintenance  Topic Date Due  .  INFLUENZA VACCINE  08/28/2017  . TETANUS/TDAP  08/09/2024  . HIV Screening  Completed    Lab Results  Component Value Date   WBC 5.3 03/10/2017   HGB 15.1 03/10/2017   HCT 42.0 03/10/2017   PLT 144 (L) 03/10/2017   GLUCOSE 120 (H) 03/10/2017   CHOL 221 (H) 09/12/2016   TRIG 119.0 09/12/2016   HDL 47.80 09/12/2016   LDLDIRECT 168.0 08/02/2014   LDLCALC 149 (H) 09/12/2016   ALT 48 09/12/2016   AST 24 09/12/2016   NA 139 03/10/2017   K 3.8 03/10/2017   CL 106 03/10/2017   CREATININE 0.86 03/10/2017   BUN 8 03/10/2017   CO2 23 03/10/2017   TSH 2.92 09/12/2016   HGBA1C 5.5 08/02/2014    Lab Results  Component Value Date   TSH 2.92 09/12/2016   Lab Results  Component Value Date   WBC 5.3 03/10/2017   HGB 15.1 03/10/2017   HCT 42.0 03/10/2017   MCV 90.5 03/10/2017   PLT 144 (L) 03/10/2017   Lab Results  Component Value Date   NA 139 03/10/2017   K 3.8 03/10/2017   CO2 23 03/10/2017   GLUCOSE 120 (H) 03/10/2017   BUN 8 03/10/2017   CREATININE 0.86 03/10/2017   BILITOT 0.7 09/12/2016   ALKPHOS 0 (L) 09/12/2016   AST 24 09/12/2016   ALT 48 09/12/2016   PROT 6.7 09/12/2016   ALBUMIN 4.3 09/12/2016   CALCIUM 8.8 (L) 03/10/2017   ANIONGAP 10 03/10/2017   GFR 116.46 09/12/2016   Lab Results  Component Value Date   CHOL 221 (H) 09/12/2016   Lab Results  Component Value Date   HDL 47.80 09/12/2016   Lab Results  Component Value Date   LDLCALC 149 (H) 09/12/2016   Lab Results  Component Value Date  TRIG 119.0 09/12/2016   Lab Results  Component Value Date   CHOLHDL 5 09/12/2016   Lab Results  Component Value Date   HGBA1C 5.5 08/02/2014         Assessment & Plan:   Problem List Items Addressed This Visit    HTN (hypertension)    Elevated today but generally well controlled. He agrees to watch and if BP remains aabove 140/90 will increase the Toprol  XL to 50 mg po bid at least til after the wedding.  no changes to meds. Encouraged heart  healthy diet such as the DASH diet and exercise as tolerated.       Mixed hyperlipidemia    Encouraged heart healthy diet, increase exercise, avoid trans fats, consider a krill oil cap daily      Abnormal LFTs    Minimize simple carbs and check cmp      Wolff-Parkinson-White (WPW) syndrome    Asymptomatic today      Anxiety and depression    Under stress at work and planning a wedding. He feels he is managing well most days      Preventative health care    Patient encouraged to maintain heart healthy diet, regular exercise, adequate sleep. Consider daily probiotics. Take medications as prescribed. labs ordered will be completed tomorrow       Vitamin B12 deficiency    Check level      Hypocalcemia    Check cmp and vitamin d       Other Visit Diagnoses    Needs flu shot    -  Primary   Relevant Orders   Flu Vaccine QUAD 6+ mos PF IM (Fluarix Quad PF) (Completed)      I have discontinued Lupita Leash Crisaborole, buPROPion, oxyCODONE-acetaminophen, ondansetron, oseltamivir, doxycycline, and predniSONE. I am also having him maintain his KRILL OIL PO, fluticasone, Vitamin B-12, tamsulosin, metoprolol succinate, losartan, and citalopram.  No orders of the defined types were placed in this encounter.   CMA served as Education administrator during this visit. History, Physical and Plan performed by medical provider. Documentation and orders reviewed and attested to.  Penni Homans, MD

## 2017-09-18 NOTE — Assessment & Plan Note (Signed)
Check cmp and vitamin d

## 2017-09-18 NOTE — Assessment & Plan Note (Signed)
Patient encouraged to maintain heart healthy diet, regular exercise, adequate sleep. Consider daily probiotics. Take medications as prescribed. labs ordered will be completed tomorrow

## 2017-09-18 NOTE — Assessment & Plan Note (Signed)
Elevated today but generally well controlled. He agrees to watch and if BP remains aabove 140/90 will increase the Toprol  XL to 50 mg po bid at least til after the wedding.  no changes to meds. Encouraged heart healthy diet such as the DASH diet and exercise as tolerated.

## 2017-09-19 ENCOUNTER — Other Ambulatory Visit (INDEPENDENT_AMBULATORY_CARE_PROVIDER_SITE_OTHER): Payer: No Typology Code available for payment source

## 2017-09-19 DIAGNOSIS — I10 Essential (primary) hypertension: Secondary | ICD-10-CM

## 2017-09-19 DIAGNOSIS — R5383 Other fatigue: Secondary | ICD-10-CM | POA: Diagnosis not present

## 2017-09-19 DIAGNOSIS — E785 Hyperlipidemia, unspecified: Secondary | ICD-10-CM

## 2017-09-19 DIAGNOSIS — E538 Deficiency of other specified B group vitamins: Secondary | ICD-10-CM

## 2017-09-19 LAB — VITAMIN D 25 HYDROXY (VIT D DEFICIENCY, FRACTURES): VITD: 16.71 ng/mL — ABNORMAL LOW (ref 30.00–100.00)

## 2017-09-19 LAB — COMPREHENSIVE METABOLIC PANEL
ALK PHOS: 51 U/L (ref 39–117)
ALT: 76 U/L — AB (ref 0–53)
AST: 39 U/L — ABNORMAL HIGH (ref 0–37)
Albumin: 4.6 g/dL (ref 3.5–5.2)
BILIRUBIN TOTAL: 1 mg/dL (ref 0.2–1.2)
BUN: 11 mg/dL (ref 6–23)
CO2: 27 mEq/L (ref 19–32)
Calcium: 9.8 mg/dL (ref 8.4–10.5)
Chloride: 101 mEq/L (ref 96–112)
Creatinine, Ser: 0.9 mg/dL (ref 0.40–1.50)
GFR: 103.91 mL/min (ref >60.00)
GLUCOSE: 101 mg/dL — AB (ref 70–99)
Potassium: 4.2 mEq/L (ref 3.5–5.1)
Sodium: 136 mEq/L (ref 135–145)
TOTAL PROTEIN: 7.3 g/dL (ref 6.0–8.3)

## 2017-09-19 LAB — CBC WITH DIFFERENTIAL/PLATELET
BASOS ABS: 0 10*3/uL (ref 0.0–0.1)
Basophils Relative: 0.6 % (ref 0.0–3.0)
EOS PCT: 0.7 % (ref 0.0–5.0)
Eosinophils Absolute: 0 10*3/uL (ref 0.0–0.7)
HEMATOCRIT: 46.4 % (ref 39.0–52.0)
Hemoglobin: 16.2 g/dL (ref 13.0–17.0)
LYMPHS ABS: 2 10*3/uL (ref 0.7–4.0)
Lymphocytes Relative: 35.3 % (ref 12.0–46.0)
MCHC: 35 g/dL (ref 30.0–36.0)
MCV: 93.2 fl (ref 78.0–100.0)
MONOS PCT: 8.9 % (ref 3.0–12.0)
Monocytes Absolute: 0.5 10*3/uL (ref 0.1–1.0)
NEUTROS PCT: 54.5 % (ref 43.0–77.0)
Neutro Abs: 3 10*3/uL (ref 1.4–7.7)
Platelets: 181 10*3/uL (ref 150.0–400.0)
RBC: 4.97 Mil/uL (ref 4.22–5.81)
RDW: 12.8 % (ref 11.5–15.5)
WBC: 5.6 10*3/uL (ref 4.0–10.5)

## 2017-09-19 LAB — LIPID PANEL
CHOLESTEROL: 249 mg/dL — AB (ref 0–200)
HDL: 48.1 mg/dL (ref >39.00)
LDL Cholesterol: 169 mg/dL — ABNORMAL HIGH (ref 0–99)
NONHDL: 201.02
Total CHOL/HDL Ratio: 5
Triglycerides: 158 mg/dL — ABNORMAL HIGH (ref 0.0–149.0)
VLDL: 31.6 mg/dL (ref 0.0–40.0)

## 2017-09-19 LAB — VITAMIN B12: VITAMIN B 12: 335 pg/mL (ref 211–911)

## 2017-09-19 LAB — TSH: TSH: 2.3 u[IU]/mL (ref 0.35–4.50)

## 2017-09-22 ENCOUNTER — Encounter: Payer: Self-pay | Admitting: Family Medicine

## 2017-09-23 ENCOUNTER — Other Ambulatory Visit: Payer: Self-pay | Admitting: Family Medicine

## 2017-09-23 LAB — TESTOSTERONE TOTAL,FREE,BIO, MALES
Albumin: 4.6 g/dL (ref 3.6–5.1)
SEX HORMONE BINDING: 15 nmol/L (ref 10–50)
Testosterone, Bioavailable: 156.9 ng/dL (ref >=110.0)
Testosterone, Free: 74.7 pg/mL (ref 46.0–224.0)
Testosterone: 335 ng/dL (ref 250–827)

## 2017-09-23 MED ORDER — VITAMIN D (ERGOCALCIFEROL) 1.25 MG (50000 UNIT) PO CAPS: 50000.0000 [IU] | ORAL_CAPSULE | ORAL | 1 refills | Status: DC

## 2017-09-24 MED FILL — VIT D2 1.25 MG (50,000 UNIT: 1.25 MG | 84 days supply | Qty: 12 | Fill #0

## 2017-10-02 ENCOUNTER — Other Ambulatory Visit: Payer: Self-pay | Admitting: Family Medicine

## 2017-10-02 MED ORDER — HYDROCHLOROTHIAZIDE 12.5 MG PO TABS: 12.5000 mg | ORAL_TABLET | Freq: Every day | ORAL | 1 refills | Status: DC

## 2017-10-02 NOTE — Progress Notes (Signed)
Pt has been having persistently elevated BPs- ranging 140-160/110-110.  Some associated HA.  Based on this, will add HCTZ to Losartan 50mg  daily and keep close eye on BP.

## 2017-10-09 MED FILL — METOPROLOL SUCCINATE ER 50: 50 | 90 days supply | Qty: 90 | Fill #1

## 2017-10-31 ENCOUNTER — Other Ambulatory Visit: Payer: Self-pay | Admitting: Emergency Medicine

## 2017-10-31 MED ORDER — LOSARTAN POTASSIUM-HCTZ 50-12.5 MG PO TABS: 1.0000 | ORAL_TABLET | Freq: Every day | ORAL | 0 refills | Status: DC

## 2017-11-24 ENCOUNTER — Other Ambulatory Visit: Payer: Self-pay | Admitting: Family Medicine

## 2017-12-02 ENCOUNTER — Other Ambulatory Visit: Payer: Self-pay | Admitting: Emergency Medicine

## 2017-12-02 MED ORDER — LOSARTAN POTASSIUM-HCTZ 50-12.5 MG PO TABS: 1.0000 | ORAL_TABLET | Freq: Every day | ORAL | 1 refills | Status: DC

## 2017-12-02 MED FILL — LOSARTAN-HCTZ 50-12.5 MG TA: 50-12.5 | 30 days supply | Qty: 30 | Fill #0

## 2017-12-18 ENCOUNTER — Ambulatory Visit: Payer: No Typology Code available for payment source | Admitting: Family Medicine

## 2018-01-05 ENCOUNTER — Other Ambulatory Visit: Payer: Self-pay | Admitting: Family Medicine

## 2018-01-05 MED ORDER — LOSARTAN POTASSIUM-HCTZ 50-12.5 MG PO TABS: 1.0000 | ORAL_TABLET | Freq: Every day | ORAL | 1 refills | Status: DC

## 2018-01-05 MED ORDER — METOPROLOL SUCCINATE ER 50 MG PO TB24: 50.0000 mg | ORAL_TABLET | Freq: Every day | ORAL | 1 refills | Status: DC

## 2018-01-05 MED FILL — LOSARTAN-HCTZ 100-25 MG TAB: 100-25 | 30 days supply | Qty: 15 | Fill #0

## 2018-01-05 MED FILL — CITALOPRAM HBR 20 MG TABLET: 20 | 90 days supply | Qty: 135 | Fill #1

## 2018-01-05 MED FILL — METOPROLOL SUCCINATE ER 50: 50 | 90 days supply | Qty: 90 | Fill #0

## 2018-01-05 NOTE — Progress Notes (Signed)
Refills sent at pt request

## 2018-02-09 MED FILL — LOSARTAN-HCTZ 100-25 MG TAB: 100-25 | 30 days supply | Qty: 15 | Fill #1

## 2018-03-11 MED FILL — LOSARTAN-HCTZ 100-25 MG TAB: 100-25 | 30 days supply | Qty: 15 | Fill #2

## 2018-03-15 ENCOUNTER — Encounter: Payer: Self-pay | Admitting: Family Medicine

## 2018-03-16 ENCOUNTER — Other Ambulatory Visit: Payer: Self-pay | Admitting: Family Medicine

## 2018-03-16 MED ORDER — VORTIOXETINE HBR 10 MG PO TABS: 10.0000 mg | ORAL_TABLET | Freq: Every day | ORAL | 3 refills | Status: DC

## 2018-03-19 MED FILL — TRINTELLIX 10 MG TABLET: 10 | 30 days supply | Qty: 30 | Fill #0

## 2018-04-12 ENCOUNTER — Encounter: Payer: Self-pay | Admitting: Family Medicine

## 2018-04-17 ENCOUNTER — Telehealth: Payer: Self-pay | Admitting: Family Medicine

## 2018-04-17 MED ORDER — VORTIOXETINE HBR 10 MG PO TABS: 10.0000 mg | ORAL_TABLET | Freq: Every day | ORAL | 1 refills | Status: DC

## 2018-04-17 MED ORDER — HYDROCHLOROTHIAZIDE 12.5 MG PO TABS: 12.5000 mg | ORAL_TABLET | Freq: Every day | ORAL | 1 refills | Status: DC

## 2018-04-17 MED FILL — HYDROCHLOROTHIAZIDE 12.5 MG: 12.5 | 90 days supply | Qty: 90 | Fill #0

## 2018-04-17 MED FILL — TRINTELLIX 10 MG TABLET: 10 | 90 days supply | Qty: 90 | Fill #0

## 2018-04-17 NOTE — Telephone Encounter (Signed)
Pt is asking for refill on BP meds (HCTZ) and Trintellix.  Prescriptions sent to local pharmacy

## 2018-04-20 MED FILL — METOPROLOL SUCCINATE ER 50: 50 | 90 days supply | Qty: 90 | Fill #1

## 2018-05-02 MED FILL — LOSARTAN-HCTZ 100-25 MG TAB: 100-25 | 30 days supply | Qty: 15 | Fill #3

## 2018-06-02 MED FILL — LOSARTAN-HCTZ 100-25 MG TAB: 100-25 | 30 days supply | Qty: 15 | Fill #4

## 2018-06-09 ENCOUNTER — Encounter: Payer: Self-pay | Admitting: Family Medicine

## 2018-06-09 ENCOUNTER — Other Ambulatory Visit: Payer: Self-pay

## 2018-06-09 ENCOUNTER — Ambulatory Visit (INDEPENDENT_AMBULATORY_CARE_PROVIDER_SITE_OTHER): Payer: No Typology Code available for payment source | Admitting: Family Medicine

## 2018-06-09 VITALS — BP 132/90 | HR 61 | Temp 98.5°F | Resp 16 | Ht 69.0 in | Wt 250.0 lb

## 2018-06-09 DIAGNOSIS — H6123 Impacted cerumen, bilateral: Secondary | ICD-10-CM

## 2018-06-09 NOTE — Progress Notes (Signed)
   Subjective:    Patient ID: Nicholas Robertson, male    DOB: 10/16/1985, 33 y.o.   MRN: [id number]  HPI Ear fullness- pt has hx of eustachian tube dysfxn but noted that last night he had increase in muffled hearing and some discomfort on L.  He has hx of cerumen impaction and is here today for evaluation and removal if possible.  No fevers, chills, sinus pressure, nasal congestion.   Review of Systems For ROS see HPI     Objective:   Physical Exam Vitals signs reviewed.  Constitutional:      General: He is not in acute distress.    Appearance: Normal appearance. He is not ill-appearing.  HENT:     Head: Normocephalic and atraumatic.     Right Ear: There is impacted cerumen.     Left Ear: There is impacted cerumen.     Ears:     Comments: Ears were successfully irrigated, pt tolerated w/o difficulty, had immediate relief TMs WNL bilaterally Neurological:     Mental Status: He is alert.           Assessment & Plan:  Cerumen impaction- ears were successfully irrigated and pt felt immediate improvement in sxs.  No further intervention needed at this time.

## 2018-06-26 MED FILL — LOSARTAN-HCTZ 100-25 MG TAB: 100-25 | 30 days supply | Qty: 15 | Fill #5

## 2018-07-01 ENCOUNTER — Telehealth: Payer: Self-pay | Admitting: Family Medicine

## 2018-07-01 NOTE — Telephone Encounter (Signed)
LVM for pt to call office and schedule VOV with provider since pt would like to discuss about podiatry and referral.

## 2018-07-02 ENCOUNTER — Ambulatory Visit (INDEPENDENT_AMBULATORY_CARE_PROVIDER_SITE_OTHER): Payer: No Typology Code available for payment source | Admitting: Family Medicine

## 2018-07-02 ENCOUNTER — Other Ambulatory Visit: Payer: Self-pay

## 2018-07-02 DIAGNOSIS — E782 Mixed hyperlipidemia: Secondary | ICD-10-CM | POA: Diagnosis not present

## 2018-07-02 DIAGNOSIS — F329 Major depressive disorder, single episode, unspecified: Secondary | ICD-10-CM

## 2018-07-02 DIAGNOSIS — I10 Essential (primary) hypertension: Secondary | ICD-10-CM

## 2018-07-02 DIAGNOSIS — E538 Deficiency of other specified B group vitamins: Secondary | ICD-10-CM | POA: Diagnosis not present

## 2018-07-02 DIAGNOSIS — F419 Anxiety disorder, unspecified: Secondary | ICD-10-CM

## 2018-07-02 DIAGNOSIS — E559 Vitamin D deficiency, unspecified: Secondary | ICD-10-CM

## 2018-07-02 DIAGNOSIS — F32A Depression, unspecified: Secondary | ICD-10-CM

## 2018-07-02 DIAGNOSIS — L600 Ingrowing nail: Secondary | ICD-10-CM

## 2018-07-02 MED ORDER — VORTIOXETINE HBR 10 MG PO TABS: 10.0000 mg | ORAL_TABLET | Freq: Every day | ORAL | 1 refills | Status: DC

## 2018-07-02 MED ORDER — METOPROLOL SUCCINATE ER 50 MG PO TB24: 50.0000 mg | ORAL_TABLET | Freq: Every day | ORAL | 1 refills | Status: DC

## 2018-07-02 MED ORDER — LOSARTAN POTASSIUM-HCTZ 50-12.5 MG PO TABS: 1.0000 | ORAL_TABLET | Freq: Every day | ORAL | 1 refills | Status: DC

## 2018-07-05 DIAGNOSIS — L600 Ingrowing nail: Secondary | ICD-10-CM | POA: Insufficient documentation

## 2018-07-05 HISTORY — DX: Ingrowing nail: L60.0

## 2018-07-05 NOTE — Assessment & Plan Note (Signed)
Well controlled, no changes to meds. Encouraged heart healthy diet such as the DASH diet and exercise as tolerated. He reports numbers within normal limits recently

## 2018-07-05 NOTE — Assessment & Plan Note (Signed)
maintain heart healthy diet, increase exercise, stay active

## 2018-07-05 NOTE — Assessment & Plan Note (Signed)
Notes a significant improvement in his anxiety since starting Trintellix.

## 2018-07-05 NOTE — Assessment & Plan Note (Signed)
Encouraged to soak feet in 1/2 hot water and 1/2 hydrogen peroxide nightly then apply clean cotton between nail and nail bed. If no improvement let us know and we will set up referral to podiatry.

## 2018-07-05 NOTE — Progress Notes (Signed)
Virtual Visit via Video Note  I connected with Nicholas Robertson on 07/02/2018 at  1:20 PM EDT by a video enabled telemedicine application and verified that I am speaking with the correct person using two identifiers.  Location: Patient: home Provider: office   I discussed the limitations of evaluation and management by telemedicine and the availability of in person appointments. The patient expressed understanding and agreed to proceed. Magdalene Molly, CMA was able to get the patient set upon the video platform.     Subjective:    Patient ID: Nicholas Robertson, male    DOB: January 21, 1986, 33 y.o.   MRN: [id number]  No chief complaint on file.   HPI Patient is in today for follow up on chronic medical concerns including hypertension and anxiety. Also noting trouble with pain in both large toes secondary ingrown toenails along medical aspects of both great toes. No redness or warmth or drainage. No recent febrile illness or hospitalizations. He notes the Trintellix has been helping his anxiety. Denies CP/palp/SOB/HA/congestion/fevers/GI or GU c/o. Taking meds as prescribed  Past Medical History:  Diagnosis Date  . Abnormal LFTs    . Deficiency of vitamin B12 09/12/2016  . Fever, unspecified 07/31/2013  . HTN (hypertension)    . Other and unspecified hyperlipidemia    . Preventative health care 04/18/2013  . Strep pharyngitis 10/20/2012  . WPW (Wolff-Parkinson-White syndrome)     s/p ablation 02/2013 by [name]    Past Surgical History:  Procedure Laterality Date  . ABLATION  03/09/2013   EPS and RFCA of manifest right posteroloateral accessory pathway  . SUPRAVENTRICULAR TACHYCARDIA ABLATION N/A 03/08/2013   Procedure: WPW ABLATION;  Surgeon: [name], MD;  Location: [facility] CATH LAB;  Service: Cardiovascular;  Laterality: N/A;    Family History  Problem Relation Age of Onset  . Hypertension Mother   . Hyperlipidemia Mother   . Mental illness Mother        anxiety  . Other Mother         glucose intolerant  . Diabetes Father        2  . Hypertension Father   . Arthritis Father   . Psoriasis Maternal Grandmother   . Hypertension Maternal Grandmother   . Hyperlipidemia Maternal Grandfather   . Heart disease Maternal Grandfather        cabg  . Hypertension Maternal Grandfather   . Polymyalgia rheumatica Maternal Grandfather   . Hypertension Paternal Grandmother   . Diabetes Paternal Grandmother        2  . COPD Paternal Grandmother   . Heart disease Paternal Grandfather        s/p MI, s/p CABG  . Hyperlipidemia Paternal Grandfather   . Hypertension Paternal Grandfather   . Diabetes Paternal Grandfather        1    Social History   Socioeconomic History  . Marital status: Single    Spouse name: Not on file  . Number of children: Not on file  . Years of education: Not on file  . Highest education level: Not on file  Occupational History  . Not on file  Social Needs  . Financial resource strain: Not on file  . Food insecurity:    Worry: Not on file    Inability: Not on file  . Transportation needs:    Medical: Not on file    Non-medical: Not on file  Tobacco Use  . Smoking status: Never Smoker  . Smokeless tobacco: Never  Used  Substance and Sexual Activity  . Alcohol use: No  . Drug use: No  . Sexual activity: Yes    Comment: no dietary restricitons, lives alone with cat, seat belts  Lifestyle  . Physical activity:    Days per week: Not on file    Minutes per session: Not on file  . Stress: Not on file  Relationships  . Social connections:    Talks on phone: Not on file    Gets together: Not on file    Attends religious service: Not on file    Active member of club or organization: Not on file    Attends meetings of clubs or organizations: Not on file    Relationship status: Not on file  . Intimate partner violence:    Fear of current or ex partner: Not on file    Emotionally abused: Not on file    Physically abused: Not on file     Forced sexual activity: Not on file  Other Topics Concern  . Not on file  Social History Narrative   Works at [employer], as [place] with partner   No major dietary restrictions.    Pet: cat, dog    Outpatient Medications Prior to Visit  Medication Sig Dispense Refill  . fluticasone (FLONASE) 50 MCG/ACT nasal spray Place 2 sprays into both nostrils daily as needed for allergies or rhinitis. 16 g 6  . losartan-hydrochlorothiazide (HYZAAR) 50-12.5 MG tablet Take 1 tablet by mouth daily. 90 tablet 1  . metoprolol succinate (TOPROL XL) 50 MG 24 hr tablet Take 1 tablet (50 mg total) by mouth daily. Take with or immediately following a meal. 90 tablet 1  . vortioxetine HBr (TRINTELLIX) 10 MG TABS tablet Take 1 tablet (10 mg total) by mouth daily. 90 tablet 1   No facility-administered medications prior to visit.     No Known Allergies  Review of Systems  Constitutional: Negative for fever and malaise/fatigue.  HENT: Negative for congestion.   Eyes: Negative for blurred vision.  Respiratory: Negative for shortness of breath.   Cardiovascular: Negative for chest pain, palpitations and leg swelling.  Gastrointestinal: Negative for abdominal pain, blood in stool and nausea.  Genitourinary: Negative for dysuria and frequency.  Musculoskeletal: Positive for joint pain. Negative for falls.  Skin: Negative for rash.  Neurological: Negative for dizziness, loss of consciousness and headaches.  Endo/Heme/Allergies: Negative for environmental allergies.  Psychiatric/Behavioral: Negative for depression. The patient is not nervous/anxious.        Objective:    Physical Exam Constitutional:      Appearance: Normal appearance. He is not ill-appearing.  HENT:     Head: Normocephalic and atraumatic.     Nose: Nose normal.  Eyes:     General:        Right eye: No discharge.        Left eye: No discharge.  Pulmonary:     Effort: Pulmonary effort is normal.  Neurological:     Mental  Status: He is alert and oriented to person, place, and time.  Psychiatric:        Mood and Affect: Mood normal.        Behavior: Behavior normal.     There were no vitals taken for this visit. Wt Readings from Last 3 Encounters:  06/09/18 250 lb (113.4 kg)  09/18/17 247 lb (112 kg)  03/10/17 235 lb (106.6 kg)    Diabetic Foot Exam - Simple   No  data filed     Lab Results  Component Value Date   WBC 5.6 09/19/2017   HGB 16.2 09/19/2017   HCT 46.4 09/19/2017   PLT 181.0 09/19/2017   GLUCOSE 101 (H) 09/19/2017   CHOL 249 (H) 09/19/2017   TRIG 158.0 (H) 09/19/2017   HDL 48.10 09/19/2017   LDLDIRECT 168.0 08/02/2014   LDLCALC 169 (H) 09/19/2017   ALT 76 (H) 09/19/2017   AST 39 (H) 09/19/2017   NA 136 09/19/2017   K 4.2 09/19/2017   CL 101 09/19/2017   CREATININE 0.90 09/19/2017   BUN 11 09/19/2017   CO2 27 09/19/2017   TSH 2.30 09/19/2017   HGBA1C 5.5 08/02/2014    Lab Results  Component Value Date   TSH 2.30 09/19/2017   Lab Results  Component Value Date   WBC 5.6 09/19/2017   HGB 16.2 09/19/2017   HCT 46.4 09/19/2017   MCV 93.2 09/19/2017   PLT 181.0 09/19/2017   Lab Results  Component Value Date   NA 136 09/19/2017   K 4.2 09/19/2017   CO2 27 09/19/2017   GLUCOSE 101 (H) 09/19/2017   BUN 11 09/19/2017   CREATININE 0.90 09/19/2017   BILITOT 1.0 09/19/2017   ALKPHOS 51 09/19/2017   AST 39 (H) 09/19/2017   ALT 76 (H) 09/19/2017   PROT 7.3 09/19/2017   ALBUMIN 4.6 09/19/2017   CALCIUM 9.8 09/19/2017   ANIONGAP 10 03/10/2017   GFR 103.91 09/19/2017   Lab Results  Component Value Date   CHOL 249 (H) 09/19/2017   Lab Results  Component Value Date   HDL 48.10 09/19/2017   Lab Results  Component Value Date   LDLCALC 169 (H) 09/19/2017   Lab Results  Component Value Date   TRIG 158.0 (H) 09/19/2017   Lab Results  Component Value Date   CHOLHDL 5 09/19/2017   Lab Results  Component Value Date   HGBA1C 5.5 08/02/2014       Assessment  & Plan:   Problem List Items Addressed This Visit    HTN (hypertension) - Primary    Well controlled, no changes to meds. Encouraged heart healthy diet such as the DASH diet and exercise as tolerated. He reports numbers within normal limits recently      Relevant Medications   losartan-hydrochlorothiazide (HYZAAR) 50-12.5 MG tablet   metoprolol succinate (TOPROL XL) 50 MG 24 hr tablet   Other Relevant Orders   CBC   Comprehensive metabolic panel   TSH   Mixed hyperlipidemia    maintain heart healthy diet, increase exercise, stay active      Relevant Medications   losartan-hydrochlorothiazide (HYZAAR) 50-12.5 MG tablet   metoprolol succinate (TOPROL XL) 50 MG 24 hr tablet   Other Relevant Orders   Lipid panel   Anxiety and depression    Notes a significant improvement in his anxiety since starting Trintellix.       Relevant Medications   vortioxetine HBr (TRINTELLIX) 10 MG TABS tablet   Vitamin B12 deficiency   Ingrown toenail of both feet    Encouraged to soak feet in 1/2 hot water and 1/2 hydrogen peroxide nightly then apply clean cotton between nail and nail bed. If no improvement let us know and we will set up referral to podiatry.        Other Visit Diagnoses    Vitamin D deficiency       Relevant Orders   VITAMIN D 25 Hydroxy (Vit-D Deficiency, Fractures)  I am having Nicholas Robertson maintain his fluticasone, losartan-hydrochlorothiazide, metoprolol succinate, and vortioxetine HBr.  Meds ordered this encounter  Medications  . losartan-hydrochlorothiazide (HYZAAR) 50-12.5 MG tablet    Sig: Take 1 tablet by mouth daily.    Dispense:  90 tablet    Refill:  1  . metoprolol succinate (TOPROL XL) 50 MG 24 hr tablet    Sig: Take 1 tablet (50 mg total) by mouth daily. Take with or immediately following a meal.    Dispense:  90 tablet    Refill:  1  . vortioxetine HBr (TRINTELLIX) 10 MG TABS tablet    Sig: Take 1 tablet (10 mg total) by mouth daily.     Dispense:  90 tablet    Refill:  1    I discussed the assessment and treatment plan with the patient. The patient was provided an opportunity to ask questions and all were answered. The patient agreed with the plan and demonstrated an understanding of the instructions.   The patient was advised to call back or seek an in-person evaluation if the symptoms worsen or if the condition fails to improve as anticipated.  I provided 15 minutes of non-face-to-face time during this encounter.   Penni Homans, MD

## 2018-07-19 ENCOUNTER — Encounter: Payer: Self-pay | Admitting: Family Medicine

## 2018-07-20 MED ORDER — METOPROLOL SUCCINATE ER 50 MG PO TB24: 50.0000 mg | ORAL_TABLET | Freq: Every day | ORAL | 1 refills | Status: DC

## 2018-07-20 MED ORDER — VORTIOXETINE HBR 10 MG PO TABS: 10.0000 mg | ORAL_TABLET | Freq: Every day | ORAL | 1 refills | Status: DC

## 2018-07-20 MED FILL — TRINTELLIX 10 MG TABLET: 10 | 90 days supply | Qty: 90 | Fill #0

## 2018-07-20 MED FILL — METOPROLOL SUCCINATE ER 50: 50 | 90 days supply | Qty: 90 | Fill #0

## 2018-08-24 ENCOUNTER — Telehealth: Payer: Self-pay | Admitting: Emergency Medicine

## 2018-08-24 MED ORDER — TRIAMCINOLONE ACETONIDE 0.1 % EX OINT: 1.0000 "application " | TOPICAL_OINTMENT | Freq: Two times a day (BID) | CUTANEOUS | 1 refills | Status: AC

## 2018-08-24 MED FILL — TRIAMCINOLONE 0.1% OINTMENT: 0.1 | 30 days supply | Qty: 90 | Fill #0

## 2018-08-24 NOTE — Telephone Encounter (Signed)
Prescription for Triamcinolone sent to [facility]

## 2018-08-24 NOTE — Telephone Encounter (Signed)
Patient requesting a steroid cream for eczema. Per previous discussion

## 2018-08-24 NOTE — Addendum Note (Signed)
Addended by: Midge Minium on: 08/24/2018 12:57 PM   Modules accepted: Orders

## 2018-09-11 ENCOUNTER — Encounter: Payer: Self-pay | Admitting: Family Medicine

## 2018-09-14 ENCOUNTER — Other Ambulatory Visit: Payer: Self-pay | Admitting: Family Medicine

## 2018-09-14 DIAGNOSIS — L600 Ingrowing nail: Secondary | ICD-10-CM

## 2018-09-14 NOTE — Progress Notes (Unsigned)
amb ref °

## 2018-09-24 ENCOUNTER — Encounter: Payer: Self-pay | Admitting: Podiatry

## 2018-09-24 ENCOUNTER — Other Ambulatory Visit: Payer: Self-pay

## 2018-09-24 ENCOUNTER — Ambulatory Visit: Payer: No Typology Code available for payment source | Admitting: Podiatry

## 2018-09-24 VITALS — BP 168/98 | HR 73

## 2018-09-24 DIAGNOSIS — L600 Ingrowing nail: Secondary | ICD-10-CM | POA: Diagnosis not present

## 2018-09-24 MED ORDER — NEOMYCIN-POLYMYXIN-HC 3.5-10000-1 OT SOLN: OTIC | 0 refills | Status: DC

## 2018-09-24 MED FILL — NEO/POLYMYXIN/HC EAR SOLN: 3.5-10000-1 | 50 days supply | Qty: 10 | Fill #0

## 2018-09-24 NOTE — Patient Instructions (Signed)

## 2018-09-25 NOTE — Progress Notes (Signed)
Subjective:   Patient ID: Nicholas Robertson, male   DOB: 33 y.o.   MRN: [id number]   HPI Patient presents with chronic ingrown toenails of the big toes bilateral stating that they have been sore and making it hard for him to wear shoe gear.  He is tried trimming soaking and has significant family history of this problem.  Patient does not smoke and likes to be active   Review of Systems  All other systems reviewed and are negative.       Objective:  Physical Exam Vitals signs and nursing note reviewed.  Constitutional:      Appearance: He is well-developed.  Pulmonary:     Effort: Pulmonary effort is normal.  Musculoskeletal: Normal range of motion.  Skin:    General: Skin is warm.  Neurological:     Mental Status: He is alert.     Neurovascular status intact muscle strength was found to be adequate range of motion within normal limits.  Patient is found to have incurvated medial borders of the hallux bilateral that are painful when pressed and are making shoe gear difficult with light redness noted.  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  Explained procedure risk and he signed consent form and today I infiltrated each hallux 60 mg Xylocaine Marcaine mixture remove medial borders exposed matrix and applied phenol 3 applications 30 seconds followed by alcohol lavage sterile dressing and gave instructions for soaks and to leave dressings on for 24 hours but take them off earlier if any throbbing were to occur and wrote prescriptions for drops and encouraged to call with any questions concerns he may have

## 2018-09-28 ENCOUNTER — Other Ambulatory Visit: Payer: Self-pay | Admitting: Emergency Medicine

## 2018-09-28 DIAGNOSIS — I10 Essential (primary) hypertension: Secondary | ICD-10-CM

## 2018-09-28 MED ORDER — LOSARTAN POTASSIUM-HCTZ 50-12.5 MG PO TABS: 1.0000 | ORAL_TABLET | Freq: Every day | ORAL | 1 refills | Status: DC

## 2018-09-28 MED FILL — LOSARTAN-HCTZ 100-25 MG TAB: 100-25 | 30 days supply | Qty: 15 | Fill #0

## 2018-09-28 NOTE — Telephone Encounter (Signed)
Patient requesting a refill of the Losartan-HCTZ.  Per Birdie Riddle given the ok to refill medication to preferred pharmacy.  Rx sent, Patient notified

## 2018-10-06 MED FILL — METOPROLOL SUCCINATE ER 50: 50 | 90 days supply | Qty: 90 | Fill #1

## 2018-10-08 ENCOUNTER — Other Ambulatory Visit: Payer: Self-pay | Admitting: Emergency Medicine

## 2018-10-08 DIAGNOSIS — F419 Anxiety disorder, unspecified: Secondary | ICD-10-CM

## 2018-10-08 DIAGNOSIS — F32A Depression, unspecified: Secondary | ICD-10-CM

## 2018-10-08 DIAGNOSIS — F329 Major depressive disorder, single episode, unspecified: Secondary | ICD-10-CM

## 2018-10-08 MED ORDER — VORTIOXETINE HBR 10 MG PO TABS: 10.0000 mg | ORAL_TABLET | Freq: Every day | ORAL | 1 refills | Status: DC

## 2018-10-08 MED FILL — TRINTELLIX 10 MG TABLET: 10 | 90 days supply | Qty: 90 | Fill #0

## 2018-10-26 MED FILL — LOSARTAN-HCTZ 100-25 MG TAB: 100-25 | 30 days supply | Qty: 15 | Fill #1

## 2018-11-30 MED FILL — LOSARTAN-HCTZ 100-25 MG TAB: 100-25 | 30 days supply | Qty: 15 | Fill #2

## 2018-12-25 MED FILL — LOSARTAN-HCTZ 100-25 MG TAB: 100-25 | 30 days supply | Qty: 15 | Fill #3

## 2018-12-31 ENCOUNTER — Encounter: Payer: No Typology Code available for payment source | Admitting: Family Medicine

## 2019-01-18 ENCOUNTER — Other Ambulatory Visit: Payer: Self-pay | Admitting: Emergency Medicine

## 2019-01-18 DIAGNOSIS — F329 Major depressive disorder, single episode, unspecified: Secondary | ICD-10-CM

## 2019-01-18 DIAGNOSIS — I10 Essential (primary) hypertension: Secondary | ICD-10-CM

## 2019-01-18 DIAGNOSIS — I456 Pre-excitation syndrome: Secondary | ICD-10-CM

## 2019-01-18 DIAGNOSIS — F419 Anxiety disorder, unspecified: Secondary | ICD-10-CM

## 2019-01-18 DIAGNOSIS — F32A Depression, unspecified: Secondary | ICD-10-CM

## 2019-01-18 MED ORDER — METOPROLOL SUCCINATE ER 50 MG PO TB24: 50.0000 mg | ORAL_TABLET | Freq: Every day | ORAL | 3 refills | Status: DC

## 2019-01-18 MED ORDER — VORTIOXETINE HBR 10 MG PO TABS: 10.0000 mg | ORAL_TABLET | Freq: Every day | ORAL | 3 refills | Status: DC

## 2019-01-18 MED FILL — TRINTELLIX 10 MG TABLET: 10 | 90 days supply | Qty: 90 | Fill #0

## 2019-01-18 MED FILL — METOPROLOL SUCCINATE ER 50: 50 | 90 days supply | Qty: 90 | Fill #0

## 2019-01-28 MED FILL — LOSARTAN-HCTZ 100-25 MG TAB: 100-25 | 30 days supply | Qty: 15 | Fill #4

## 2019-02-22 ENCOUNTER — Telehealth: Payer: Self-pay | Admitting: Emergency Medicine

## 2019-02-22 DIAGNOSIS — J321 Chronic frontal sinusitis: Secondary | ICD-10-CM

## 2019-02-22 MED ORDER — MOXIFLOXACIN HCL 400 MG PO TABS: 400.0000 mg | ORAL_TABLET | Freq: Every day | ORAL | 0 refills | Status: AC

## 2019-02-22 NOTE — Telephone Encounter (Signed)
Patient having several months of frontal sinus pressure and pain and chronic, ongoing nasal congestion with thick green mucus. No chest congestion or cough. Previous ENT Dr Ernesto Rutherford has retired. Requesting referral to new ENT and consideration of antibiotic for chronic sinusitis while waiting appointment. FYI: Does not do well with Augmentin tablets

## 2019-02-22 NOTE — Telephone Encounter (Signed)
Patient advised of recommendations. Referral placed and abx sent to the pharmacy

## 2019-02-22 NOTE — Telephone Encounter (Signed)
Ok for referral to ENT (dx chronic sinusitis) and to start Avelox 400mg  daily x7 days

## 2019-02-25 ENCOUNTER — Encounter: Payer: Self-pay | Admitting: Family Medicine

## 2019-03-03 ENCOUNTER — Other Ambulatory Visit: Payer: Self-pay

## 2019-03-04 ENCOUNTER — Encounter: Payer: Self-pay | Admitting: Family Medicine

## 2019-03-04 ENCOUNTER — Ambulatory Visit (INDEPENDENT_AMBULATORY_CARE_PROVIDER_SITE_OTHER): Payer: No Typology Code available for payment source | Admitting: Family Medicine

## 2019-03-04 ENCOUNTER — Other Ambulatory Visit: Payer: Self-pay

## 2019-03-04 VITALS — BP 152/102 | HR 81 | Temp 97.3°F | Resp 12 | Ht 69.0 in | Wt 260.0 lb

## 2019-03-04 DIAGNOSIS — Z0000 Encounter for general adult medical examination without abnormal findings: Secondary | ICD-10-CM | POA: Diagnosis not present

## 2019-03-04 DIAGNOSIS — E538 Deficiency of other specified B group vitamins: Secondary | ICD-10-CM | POA: Diagnosis not present

## 2019-03-04 DIAGNOSIS — E559 Vitamin D deficiency, unspecified: Secondary | ICD-10-CM

## 2019-03-04 DIAGNOSIS — E782 Mixed hyperlipidemia: Secondary | ICD-10-CM

## 2019-03-04 DIAGNOSIS — R7989 Other specified abnormal findings of blood chemistry: Secondary | ICD-10-CM

## 2019-03-04 DIAGNOSIS — L219 Seborrheic dermatitis, unspecified: Secondary | ICD-10-CM

## 2019-03-04 DIAGNOSIS — I10 Essential (primary) hypertension: Secondary | ICD-10-CM

## 2019-03-04 DIAGNOSIS — L600 Ingrowing nail: Secondary | ICD-10-CM

## 2019-03-04 DIAGNOSIS — F32A Depression, unspecified: Secondary | ICD-10-CM

## 2019-03-04 DIAGNOSIS — R945 Abnormal results of liver function studies: Secondary | ICD-10-CM

## 2019-03-04 DIAGNOSIS — F329 Major depressive disorder, single episode, unspecified: Secondary | ICD-10-CM

## 2019-03-04 DIAGNOSIS — F419 Anxiety disorder, unspecified: Secondary | ICD-10-CM

## 2019-03-04 LAB — VITAMIN B12: Vitamin B-12: 252 pg/mL (ref 211–911)

## 2019-03-04 LAB — CBC
HCT: 45.5 % (ref 39.0–52.0)
Hemoglobin: 15.9 g/dL (ref 13.0–17.0)
MCHC: 35 g/dL (ref 30.0–36.0)
MCV: 92 fl (ref 78.0–100.0)
Platelets: 184 10*3/uL (ref 150.0–400.0)
RBC: 4.94 Mil/uL (ref 4.22–5.81)
RDW: 13 % (ref 11.5–15.5)
WBC: 8.8 10*3/uL (ref 4.0–10.5)

## 2019-03-04 LAB — COMPREHENSIVE METABOLIC PANEL
ALT: 167 U/L — ABNORMAL HIGH (ref 0–53)
AST: 73 U/L — ABNORMAL HIGH (ref 0–37)
Albumin: 4.6 g/dL (ref 3.5–5.2)
Alkaline Phosphatase: 52 U/L (ref 39–117)
BUN: 12 mg/dL (ref 6–23)
CO2: 25 mEq/L (ref 19–32)
Calcium: 9.6 mg/dL (ref 8.4–10.5)
Chloride: 102 mEq/L (ref 96–112)
Creatinine, Ser: 0.89 mg/dL (ref 0.40–1.50)
GFR: 98.15 mL/min (ref >60.00)
Glucose, Bld: 88 mg/dL (ref 70–99)
Potassium: 3.6 mEq/L (ref 3.5–5.1)
Sodium: 138 mEq/L (ref 135–145)
Total Bilirubin: 0.9 mg/dL (ref 0.2–1.2)
Total Protein: 7.1 g/dL (ref 6.0–8.3)

## 2019-03-04 LAB — LIPID PANEL
Cholesterol: 264 mg/dL — ABNORMAL HIGH (ref 0–200)
HDL: 52.7 mg/dL (ref >39.00)
LDL Cholesterol: 179 mg/dL — ABNORMAL HIGH (ref 0–99)
NonHDL: 210.84
Total CHOL/HDL Ratio: 5
Triglycerides: 157 mg/dL — ABNORMAL HIGH (ref 0.0–149.0)
VLDL: 31.4 mg/dL (ref 0.0–40.0)

## 2019-03-04 LAB — TSH: TSH: 3.19 u[IU]/mL (ref 0.35–4.50)

## 2019-03-04 LAB — VITAMIN D 25 HYDROXY (VIT D DEFICIENCY, FRACTURES): VITD: 14.91 ng/mL — ABNORMAL LOW (ref 30.00–100.00)

## 2019-03-04 MED ORDER — VORTIOXETINE HBR 20 MG PO TABS: 20.0000 mg | ORAL_TABLET | Freq: Every day | ORAL | 1 refills | Status: DC

## 2019-03-04 MED ORDER — HYDROCHLOROTHIAZIDE 25 MG PO TABS: 25.0000 mg | ORAL_TABLET | Freq: Every day | ORAL | 1 refills | Status: DC

## 2019-03-04 MED ORDER — LOSARTAN POTASSIUM 50 MG PO TABS: 50.0000 mg | ORAL_TABLET | Freq: Every day | ORAL | 1 refills | Status: DC

## 2019-03-04 MED FILL — LOSARTAN POTASSIUM 50 MG TA: 50 | 90 days supply | Qty: 90 | Fill #0

## 2019-03-04 MED FILL — TRINTELLIX 20 MG TABLET: 20 | 90 days supply | Qty: 90 | Fill #0

## 2019-03-04 MED FILL — HYDROCHLOROTHIAZIDE 25 MG T: 25 | 90 days supply | Qty: 90 | Fill #0

## 2019-03-04 NOTE — Patient Instructions (Addendum)
Omron Blood Pressure cuff, upper arm, want BP 100-140/60-90 Pulse oximeter, want oxygen in 90s  Weekly vitals  Take Multivitamin with minerals, selenium Vitamin D 1000-2000 IU daily Probiotic with lactobacillus and bifidophilus Asprin EC 81 mg daily  Melatonin 2-5 mg at bedtime  [email or web address]  The mRNA technology has been in development for 20 years and we already had the Coronavirus family of viruses (which usually just cause the common cold) genetically mapped already which is why we were able to come up with viable vaccine candidates so quickly in stage 1, then stage 2 scientifically took the correct amount of time what we did to speed it up was just build the manufacturing platform at the same time we were running the experiments so if it worked we could produce faster. And stage 3 has now had many months and millions of people immunized and we are seeing the immunity hold for over 9 months now with sign of it dissipating and no significant numbers of adverse reactions.  During every flu season we see 2 anaphylactic reactions for every million shots given and we initially thought we would see 11 per million with the COVID vaccine but now we see only 2-3 with Moderna and 5 or so with Pfizer so compared to someone is dying every 20 minutes from COVID and more deadly and infectious strains are coming it is definitely best when weighing the risks and benefits to take the shots.  Another pooled analysis of the 5 most utilized vaccines in the world shows that after full immunization so far no one has died from COVID.   Preventive Care 21-39 Years Old, Male Preventive care refers to lifestyle choices and visits with your health care provider that can promote health and wellness. This includes:  A yearly physical exam. This is also called an annual well check.  Regular dental and eye exams.  Immunizations.  Screening for certain conditions.  Healthy  lifestyle choices, such as eating a healthy diet, getting regular exercise, not using drugs or products that contain nicotine and tobacco, and limiting alcohol use. What can I expect for my preventive care visit? Physical exam Your health care provider will check:  Height and weight. These may be used to calculate body mass index (BMI), which is a measurement that tells if you are at a healthy weight.  Heart rate and blood pressure.  Your skin for abnormal spots. Counseling Your health care provider may ask you questions about:  Alcohol, tobacco, and drug use.  Emotional well-being.  Home and relationship well-being.  Sexual activity.  Eating habits.  Work and work environment. What immunizations do I need?  Influenza (flu) vaccine  This is recommended every year. Tetanus, diphtheria, and pertussis (Tdap) vaccine  You may need a Td booster every 10 years. Varicella (chickenpox) vaccine  You may need this vaccine if you have not already been vaccinated. Human papillomavirus (HPV) vaccine  If recommended by your health care provider, you may need three doses over 6 months. Measles, mumps, and rubella (MMR) vaccine  You may need at least one dose of MMR. You may also need a second dose. Meningococcal conjugate (MenACWY) vaccine  One dose is recommended if you are 19-21 years old and a first-year college student living in a residence hall, or if you have one of several medical conditions. You may also need additional booster doses. Pneumococcal conjugate (PCV13) vaccine  You may need this if you have certain conditions and were not previously vaccinated. Pneumococcal   polysaccharide (PPSV23) vaccine  You may need one or two doses if you smoke cigarettes or if you have certain conditions. Hepatitis A vaccine  You may need this if you have certain conditions or if you travel or work in places where you may be exposed to hepatitis A. Hepatitis B vaccine  You may need  this if you have certain conditions or if you travel or work in places where you may be exposed to hepatitis B. Haemophilus influenzae type b (Hib) vaccine  You may need this if you have certain risk factors. You may receive vaccines as individual doses or as more than one vaccine together in one shot (combination vaccines). Talk with your health care provider about the risks and benefits of combination vaccines. What tests do I need? Blood tests  Lipid and cholesterol levels. These may be checked every 5 years starting at age 20.  Hepatitis C test.  Hepatitis B test. Screening   Diabetes screening. This is done by checking your blood sugar (glucose) after you have not eaten for a while (fasting).  Sexually transmitted disease (STD) testing. Talk with your health care provider about your test results, treatment options, and if necessary, the need for more tests. Follow these instructions at home: Eating and drinking   Eat a diet that includes fresh fruits and vegetables, whole grains, lean protein, and low-fat dairy products.  Take vitamin and mineral supplements as recommended by your health care provider.  Do not drink alcohol if your health care provider tells you not to drink.  If you drink alcohol: ? Limit how much you have to 0-2 drinks a day. ? Be aware of how much alcohol is in your drink. In the [place] one drink equals one 12 oz bottle of beer (355 mL), one 5 oz glass of wine (148 mL), or one 1 oz glass of hard liquor (44 mL). Lifestyle  Take daily care of your teeth and gums.  Stay active. Exercise for at least 30 minutes on 5 or more days each week.  Do not use any products that contain nicotine or tobacco, such as cigarettes, e-cigarettes, and chewing tobacco. If you need help quitting, ask your health care provider.  If you are sexually active, practice safe sex. Use a condom or other form of protection to prevent STIs (sexually transmitted infections). What's  next?  Go to your health care provider once a year for a well check visit.  Ask your health care provider how often you should have your eyes and teeth checked.  Stay up to date on all vaccines. This information is not intended to replace advice given to you by your health care provider. Make sure you discuss any questions you have with your health care provider. Document Revised: 01/08/2018 Document Reviewed: 01/08/2018 Elsevier Patient Education  2020 [employer].  

## 2019-03-05 ENCOUNTER — Other Ambulatory Visit: Payer: Self-pay | Admitting: Family Medicine

## 2019-03-05 ENCOUNTER — Other Ambulatory Visit: Payer: No Typology Code available for payment source

## 2019-03-05 ENCOUNTER — Other Ambulatory Visit: Payer: Self-pay

## 2019-03-05 DIAGNOSIS — E538 Deficiency of other specified B group vitamins: Secondary | ICD-10-CM

## 2019-03-05 MED ORDER — VITAMIN D (ERGOCALCIFEROL) 1.25 MG (50000 UNIT) PO CAPS: 50000.0000 [IU] | ORAL_CAPSULE | ORAL | 4 refills | Status: DC

## 2019-03-05 MED FILL — VIT D2 1.25 MG (50,000 UNIT: 1.25 MG | 28 days supply | Qty: 4 | Fill #0

## 2019-03-08 ENCOUNTER — Encounter: Payer: Self-pay | Admitting: Family Medicine

## 2019-03-08 ENCOUNTER — Other Ambulatory Visit: Payer: Self-pay | Admitting: Family Medicine

## 2019-03-08 DIAGNOSIS — E559 Vitamin D deficiency, unspecified: Secondary | ICD-10-CM

## 2019-03-08 DIAGNOSIS — L219 Seborrheic dermatitis, unspecified: Secondary | ICD-10-CM | POA: Insufficient documentation

## 2019-03-08 DIAGNOSIS — R7989 Other specified abnormal findings of blood chemistry: Secondary | ICD-10-CM

## 2019-03-08 HISTORY — DX: Seborrheic dermatitis, unspecified: L21.9

## 2019-03-08 HISTORY — DX: Vitamin D deficiency, unspecified: E55.9

## 2019-03-08 LAB — INTRINSIC FACTOR ANTIBODIES: Intrinsic Factor: NEGATIVE

## 2019-03-08 MED ORDER — ATORVASTATIN CALCIUM 10 MG PO TABS: 10.0000 mg | ORAL_TABLET | Freq: Every day | ORAL | 1 refills | Status: DC

## 2019-03-08 MED ORDER — VITAMIN D (ERGOCALCIFEROL) 1.25 MG (50000 UNIT) PO CAPS: 50000.0000 [IU] | ORAL_CAPSULE | ORAL | 1 refills | Status: DC

## 2019-03-08 MED FILL — ATORVASTATIN 10 MG TABLET: 10 | 90 days supply | Qty: 90 | Fill #0

## 2019-03-08 NOTE — Assessment & Plan Note (Signed)
Supplement and monitor 

## 2019-03-08 NOTE — Assessment & Plan Note (Addendum)
Mildly elevated. Will maintain dose of Losartan and increase the HCTZ to 25 mg daily. Encouraged heart healthy diet such as the DASH diet and exercise as tolerated.

## 2019-03-08 NOTE — Assessment & Plan Note (Signed)
Labs reveal deficiency. Start on Vitamin D 50000 IU caps, 1 cap po weekly x 12 weeks. Disp #4 with 4 rf. Also take daily Vitamin D over the counter. If already taking a daily supplement increase by 1000 IU daily and if not start Vitamin D 2000 IU daily.  

## 2019-03-08 NOTE — Progress Notes (Signed)
Subjective:    Patient ID: Nicholas Robertson, male    DOB: 09/16/1985, 34 y.o.   MRN: [id number]  Chief Complaint  Patient presents with  . Annual Exam    HPI Patient is in today for annual preventative exam and follow up on chronic medical concerns. He feels well. No recent febrile illness or hospitalizations. He has been working full time in healthcare during the pandemic. He has been managing the stress but acknowledges it can be a lot. His blood pressure has generally been in the 120s over 89s at home. But can increase as high as 150s over 100 especially with stress. His ingrown toenail is much better after having it surgically repaired by podiatry. He is having trouble with increased head congestion and ear pressure. His flaky skin is still intermittent on his head and he is using Cetaphil with some relief. Denies CP/palp/SOB/HA/congestion/fevers/GI or GU c/o. Taking meds as prescribed  Past Medical History:  Diagnosis Date  . Abnormal LFTs    . Deficiency of vitamin B12 09/12/2016  . Fever, unspecified 07/31/2013  . HTN (hypertension)    . Other and unspecified hyperlipidemia    . Preventative health care 04/18/2013  . Strep pharyngitis 10/20/2012  . WPW (Wolff-Parkinson-White syndrome)     s/p ablation 02/2013 by [name]    Past Surgical History:  Procedure Laterality Date  . ABLATION  03/09/2013   EPS and RFCA of manifest right posteroloateral accessory pathway  . SUPRAVENTRICULAR TACHYCARDIA ABLATION N/A 03/08/2013   Procedure: WPW ABLATION;  Surgeon: [name], MD;  Location: [facility] C[facility] LAB;  Service: Cardiovascular;  Laterality: N/A;    Family History  Problem Relation Age of Onset  . Hypertension Mother   . Hyperlipidemia Mother   . Mental illness Mother        anxiety  . Other Mother        glucose intolerant  . Diabetes Father        2  . Hypertension Father   . Arthritis Father   . Psoriasis Maternal Grandmother   . Hypertension Maternal Grandmother   .  Cancer Maternal Grandmother        lymphoma  . Hyperlipidemia Maternal Grandfather   . Heart disease Maternal Grandfather        cabg  . Hypertension Maternal Grandfather   . Polymyalgia rheumatica Maternal Grandfather   . Hypertension Paternal Grandmother   . Diabetes Paternal Grandmother        2  . COPD Paternal Grandmother   . Heart disease Paternal Grandfather        s/p MI, s/p CABG  . Hyperlipidemia Paternal Grandfather   . Hypertension Paternal Grandfather   . Diabetes Paternal Grandfather        1    Social History   Socioeconomic History  . Marital status: Married    Spouse name: Not on file  . Number of children: Not on file  . Years of education: Not on file  . Highest education level: Not on file  Occupational History  . Not on file  Tobacco Use  . Smoking status: Never Smoker  . Smokeless tobacco: Never Used  Substance and Sexual Activity  . Alcohol use: No  . Drug use: No  . Sexual activity: Yes    Comment: no dietary restricitons, lives alone with cat, seat belts  Other Topics Concern  . Not on file  Social History Narrative   Works at [employer], as [place]  Lives with partner   No major dietary restrictions.    Pet: cat, dog   Social Determinants of Health   Financial Resource Strain:   . Difficulty of Paying Living Expenses: Not on file  Food Insecurity:   . Worried About Charity fundraiser in the Last Year: Not on file  . Ran Out of Food in the Last Year: Not on file  Transportation Needs:   . Lack of Transportation (Medical): Not on file  . Lack of Transportation (Non-Medical): Not on file  Physical Activity:   . Days of Exercise per Week: Not on file  . Minutes of Exercise per Session: Not on file  Stress:   . Feeling of Stress : Not on file  Social Connections:   . Frequency of Communication with Friends and Family: Not on file  . Frequency of Social Gatherings with Friends and Family: Not on file  . Attends Religious Services:  Not on file  . Active Member of Clubs or Organizations: Not on file  . Attends Archivist Meetings: Not on file  . Marital Status: Not on file  Intimate Partner Violence:   . Fear of Current or Ex-Partner: Not on file  . Emotionally Abused: Not on file  . Physically Abused: Not on file  . Sexually Abused: Not on file    Outpatient Medications Prior to Visit  Medication Sig Dispense Refill  . metoprolol succinate (TOPROL XL) 50 MG 24 hr tablet Take 1 tablet (50 mg total) by mouth daily. Take with or immediately following a meal. 90 tablet 3  . triamcinolone ointment (KENALOG) 0.1 % Apply 1 application topically 2 (two) times daily. 90 g 1  . losartan-hydrochlorothiazide (HYZAAR) 50-12.5 MG tablet Take 1 tablet by mouth daily. 90 tablet 1  . vortioxetine HBr (TRINTELLIX) 10 MG TABS tablet Take 1 tablet (10 mg total) by mouth daily. 90 tablet 3  . fluticasone (FLONASE) 50 MCG/ACT nasal spray Place 2 sprays into both nostrils daily as needed for allergies or rhinitis. 16 g 6  . neomycin-polymyxin-hydrocortisone (CORTISPORIN) OTIC solution Apply 1-2 drops to toe after soaking twice a day 10 mL 0   No facility-administered medications prior to visit.    No Known Allergies  Review of Systems  Constitutional: Negative for chills, fever and malaise/fatigue.  HENT: Negative for congestion and hearing loss.   Eyes: Negative for discharge.  Respiratory: Negative for cough, sputum production and shortness of breath.   Cardiovascular: Negative for chest pain, palpitations and leg swelling.  Gastrointestinal: Negative for abdominal pain, blood in stool, constipation, diarrhea, heartburn, nausea and vomiting.  Genitourinary: Negative for dysuria, frequency, hematuria and urgency.  Musculoskeletal: Negative for back pain, falls and myalgias.  Skin: Negative for rash.  Neurological: Negative for dizziness, sensory change, loss of consciousness, weakness and headaches.    Endo/Heme/Allergies: Negative for environmental allergies. Does not bruise/bleed easily.  Psychiatric/Behavioral: Negative for depression and suicidal ideas. The patient is not nervous/anxious and does not have insomnia.        Objective:    Physical Exam Vitals and nursing note reviewed.  Constitutional:      General: He is not in acute distress.    Appearance: He is well-developed.  HENT:     Head: Normocephalic and atraumatic.     Nose: Nose normal.  Eyes:     General:        Right eye: No discharge.        Left eye: No discharge.  Cardiovascular:  Rate and Rhythm: Normal rate and regular rhythm.  Pulmonary:     Effort: Pulmonary effort is normal.     Breath sounds: Normal breath sounds.  Abdominal:     General: Bowel sounds are normal.     Palpations: Abdomen is soft.     Tenderness: There is no abdominal tenderness.  Musculoskeletal:     Cervical back: Normal range of motion and neck supple.  Skin:    General: Skin is warm and dry.  Neurological:     Mental Status: He is alert and oriented to person, place, and time.     BP (!) 152/102 (BP Location: Left Arm, Patient Position: Sitting, Cuff Size: Large)   Pulse 81   Temp (!) 97.3 F (36.3 C) (Temporal)   Resp 12   Ht 5\' 9"  (1.753 m)   Wt 260 lb (117.9 kg)   SpO2 98%   BMI 38.40 kg/m  Wt Readings from Last 3 Encounters:  03/04/19 260 lb (117.9 kg)  06/09/18 250 lb (113.4 kg)  09/18/17 247 lb (112 kg)    Diabetic Foot Exam - Simple   No data filed     Lab Results  Component Value Date   WBC 8.8 03/04/2019   HGB 15.9 03/04/2019   HCT 45.5 03/04/2019   PLT 184.0 03/04/2019   GLUCOSE 88 03/04/2019   CHOL 264 (H) 03/04/2019   TRIG 157.0 (H) 03/04/2019   HDL 52.70 03/04/2019   LDLDIRECT 168.0 08/02/2014   LDLCALC 179 (H) 03/04/2019   ALT 167 (H) 03/04/2019   AST 73 (H) 03/04/2019   NA 138 03/04/2019   K 3.6 03/04/2019   CL 102 03/04/2019   CREATININE 0.89 03/04/2019   BUN 12 03/04/2019    CO2 25 03/04/2019   TSH 3.19 03/04/2019   HGBA1C 5.5 08/02/2014    Lab Results  Component Value Date   TSH 3.19 03/04/2019   Lab Results  Component Value Date   WBC 8.8 03/04/2019   HGB 15.9 03/04/2019   HCT 45.5 03/04/2019   MCV 92.0 03/04/2019   PLT 184.0 03/04/2019   Lab Results  Component Value Date   NA 138 03/04/2019   K 3.6 03/04/2019   CO2 25 03/04/2019   GLUCOSE 88 03/04/2019   BUN 12 03/04/2019   CREATININE 0.89 03/04/2019   BILITOT 0.9 03/04/2019   ALKPHOS 52 03/04/2019   AST 73 (H) 03/04/2019   ALT 167 (H) 03/04/2019   PROT 7.1 03/04/2019   ALBUMIN 4.6 03/04/2019   CALCIUM 9.6 03/04/2019   ANIONGAP 10 03/10/2017   GFR 98.15 03/04/2019   Lab Results  Component Value Date   CHOL 264 (H) 03/04/2019   Lab Results  Component Value Date   HDL 52.70 03/04/2019   Lab Results  Component Value Date   LDLCALC 179 (H) 03/04/2019   Lab Results  Component Value Date   TRIG 157.0 (H) 03/04/2019   Lab Results  Component Value Date   CHOLHDL 5 03/04/2019   Lab Results  Component Value Date   HGBA1C 5.5 08/02/2014       Assessment & Plan:   Problem List Items Addressed This Visit    HTN (hypertension) - Primary    Mildly elevated. Will maintain dose of Losartan and increase the HCTZ to 25 mg daily. Encouraged heart healthy diet such as the DASH diet and exercise as tolerated.       Relevant Medications   losartan (COZAAR) 50 MG tablet   hydrochlorothiazide (HYDRODIURIL) 25  MG tablet   Other Relevant Orders   CBC (Completed)   Comprehensive metabolic panel (Completed)   TSH (Completed)   Hyperlipidemia    Encouraged heart healthy diet, increase exercise, avoid trans fats, consider a krill oil cap daily. Numbers rising consider statin therapy      Relevant Medications   losartan (COZAAR) 50 MG tablet   hydrochlorothiazide (HYDRODIURIL) 25 MG tablet   Abnormal LFTs    Mild and likely fatty liver disease. Minimize simple carbs, processed and  fatty foods and increase activity. Attempt modest weight loss. Will discuss possible abdominal ultrasound with patient to confirm.       Anxiety and depression    Will increase Trintellix to 20 mg daily and reassess      Relevant Medications   vortioxetine HBr (TRINTELLIX) 20 MG TABS tablet   Preventative health care    Patient encouraged to maintain heart healthy diet, regular exercise, adequate sleep. Consider daily probiotics. Take medications as prescribed.       Vitamin B12 deficiency    Supplement and monitor      Relevant Orders   Lipid panel (Completed)   Vitamin B12 (Completed)   Ingrown toenail of both feet    Is doing much better after definitive treatment from Dr Paulla Dolly of podiatry.       Vitamin D deficiency    Labs reveal deficiency. Start on Vitamin D 50000 IU caps, 1 cap po weekly x 12 weeks. Disp #4 with 4 rf. Also take daily Vitamin D over the counter. If already taking a daily supplement increase by 1000 IU daily and if not start Vitamin D 2000 IU daily.       Relevant Orders   VITAMIN D 25 Hydroxy (Vit-D Deficiency, Fractures) (Completed)   Seborrhea    Tolerable and manageable with cetaphil usage. Remember to hydrate well, take a fatty acid supplement and avoid harsh soaps.          I have discontinued Luanna Cole. Kessenich's fluticasone, neomycin-polymyxin-hydrocortisone, losartan-hydrochlorothiazide, and vortioxetine HBr. I am also having him start on losartan, hydrochlorothiazide, and vortioxetine HBr. Additionally, I am having him maintain his triamcinolone ointment and metoprolol succinate.  Meds ordered this encounter  Medications  . losartan (COZAAR) 50 MG tablet    Sig: Take 1 tablet (50 mg total) by mouth daily.    Dispense:  90 tablet    Refill:  1  . hydrochlorothiazide (HYDRODIURIL) 25 MG tablet    Sig: Take 1 tablet (25 mg total) by mouth daily.    Dispense:  90 tablet    Refill:  1  . vortioxetine HBr (TRINTELLIX) 20 MG TABS tablet     Sig: Take 1 tablet (20 mg total) by mouth daily.    Dispense:  90 tablet    Refill:  1     Penni Homans, MD

## 2019-03-08 NOTE — Assessment & Plan Note (Signed)
Is doing much better after definitive treatment from Dr Paulla Dolly of podiatry.

## 2019-03-08 NOTE — Assessment & Plan Note (Addendum)
Mild and likely fatty liver disease. Minimize simple carbs, processed and fatty foods and increase activity. Attempt modest weight loss. Will discuss possible abdominal ultrasound with patient to confirm.

## 2019-03-08 NOTE — Assessment & Plan Note (Addendum)
Encouraged heart healthy diet, increase exercise, avoid trans fats, consider a krill oil cap daily. Numbers rising consider statin therapy

## 2019-03-08 NOTE — Assessment & Plan Note (Signed)
Tolerable and manageable with cetaphil usage. Remember to hydrate well, take a fatty acid supplement and avoid harsh soaps.

## 2019-03-08 NOTE — Assessment & Plan Note (Signed)
Patient encouraged to maintain heart healthy diet, regular exercise, adequate sleep. Consider daily probiotics. Take medications as prescribed 

## 2019-03-08 NOTE — Assessment & Plan Note (Signed)
Will increase Trintellix to 20 mg daily and reassess

## 2019-03-11 ENCOUNTER — Ambulatory Visit (INDEPENDENT_AMBULATORY_CARE_PROVIDER_SITE_OTHER): Payer: No Typology Code available for payment source | Admitting: Otolaryngology

## 2019-03-11 ENCOUNTER — Encounter (INDEPENDENT_AMBULATORY_CARE_PROVIDER_SITE_OTHER): Payer: Self-pay | Admitting: Otolaryngology

## 2019-03-11 ENCOUNTER — Other Ambulatory Visit: Payer: Self-pay

## 2019-03-11 VITALS — Temp 97.2°F

## 2019-03-11 DIAGNOSIS — J342 Deviated nasal septum: Secondary | ICD-10-CM

## 2019-03-11 DIAGNOSIS — J310 Chronic rhinitis: Secondary | ICD-10-CM

## 2019-03-11 NOTE — Progress Notes (Signed)
HPI: Nicholas Robertson is a 34 y.o. male who presents for evaluation of chronic "sinus issues".  He describes mostly chronic nasal congestion.  He frequently blows out thick mucus from his nose.  He has tried saline rinses as well as Flonase and antihistamines.  But he continues to have nasal congestion left side worse than right.  He has thick mucus discharge.  But his main problem seems to be nasal congestion and trouble breathing through his nose.  Past Medical History:  Diagnosis Date  . Abnormal LFTs    . Deficiency of vitamin B12 09/12/2016  . Fever, unspecified 07/31/2013  . HTN (hypertension)    . Other and unspecified hyperlipidemia    . Preventative health care 04/18/2013  . Strep pharyngitis 10/20/2012  . WPW (Wolff-Parkinson-White syndrome)     s/p ablation 02/2013 by [name]   Past Surgical History:  Procedure Laterality Date  . ABLATION  03/09/2013   EPS and RFCA of manifest right posteroloateral accessory pathway  . SUPRAVENTRICULAR TACHYCARDIA ABLATION N/A 03/08/2013   Procedure: WPW ABLATION;  Surgeon: [name], MD;  Location: [facility] CATH LAB;  Service: Cardiovascular;  Laterality: N/A;   Social History   Socioeconomic History  . Marital status: Married    Spouse name: Not on file  . Number of children: Not on file  . Years of education: Not on file  . Highest education level: Not on file  Occupational History  . Not on file  Tobacco Use  . Smoking status: Never Smoker  . Smokeless tobacco: Never Used  Substance and Sexual Activity  . Alcohol use: No  . Drug use: No  . Sexual activity: Yes    Comment: no dietary restricitons, lives alone with cat, seat belts  Other Topics Concern  . Not on file  Social History Narrative   Works at [employer], as [place] with partner   No major dietary restrictions.    Pet: cat, dog   Social Determinants of Health   Financial Resource Strain:   . Difficulty of Paying Living Expenses: Not on file  Food Insecurity:    . Worried About Charity fundraiser in the Last Year: Not on file  . Ran Out of Food in the Last Year: Not on file  Transportation Needs:   . Lack of Transportation (Medical): Not on file  . Lack of Transportation (Non-Medical): Not on file  Physical Activity:   . Days of Exercise per Week: Not on file  . Minutes of Exercise per Session: Not on file  Stress:   . Feeling of Stress : Not on file  Social Connections:   . Frequency of Communication with Friends and Family: Not on file  . Frequency of Social Gatherings with Friends and Family: Not on file  . Attends Religious Services: Not on file  . Active Member of Clubs or Organizations: Not on file  . Attends Archivist Meetings: Not on file  . Marital Status: Not on file   Family History  Problem Relation Age of Onset  . Hypertension Mother   . Hyperlipidemia Mother   . Mental illness Mother        anxiety  . Other Mother        glucose intolerant  . Diabetes Father        2  . Hypertension Father   . Arthritis Father   . Psoriasis Maternal Grandmother   . Hypertension Maternal Grandmother   . Cancer  Maternal Grandmother        lymphoma  . Hyperlipidemia Maternal Grandfather   . Heart disease Maternal Grandfather        cabg  . Hypertension Maternal Grandfather   . Polymyalgia rheumatica Maternal Grandfather   . Hypertension Paternal Grandmother   . Diabetes Paternal Grandmother        2  . COPD Paternal Grandmother   . Heart disease Paternal Grandfather        s/p MI, s/p CABG  . Hyperlipidemia Paternal Grandfather   . Hypertension Paternal Grandfather   . Diabetes Paternal Grandfather        1   No Known Allergies Prior to Admission medications   Medication Sig Start Date End Date Taking? Authorizing Provider  atorvastatin (LIPITOR) 10 MG tablet Take 1 tablet (10 mg total) by mouth daily. 03/08/19  Yes Mosie Lukes, MD  hydrochlorothiazide (HYDRODIURIL) 25 MG tablet Take 1 tablet (25 mg total) by  mouth daily. 03/04/19  Yes Mosie Lukes, MD  losartan (COZAAR) 50 MG tablet Take 1 tablet (50 mg total) by mouth daily. 03/04/19  Yes Mosie Lukes, MD  metoprolol succinate (TOPROL XL) 50 MG 24 hr tablet Take 1 tablet (50 mg total) by mouth daily. Take with or immediately following a meal. 01/18/19  Yes Midge Minium, MD  triamcinolone ointment (KENALOG) 0.1 % Apply 1 application topically 2 (two) times daily. 08/24/18 08/24/19 Yes Midge Minium, MD  Vitamin D, Ergocalciferol, (DRISDOL) 1.25 MG (50000 UNIT) CAPS capsule Take 1 capsule (50,000 Units total) by mouth every 7 (seven) days. 03/08/19  Yes Mosie Lukes, MD  vortioxetine HBr (TRINTELLIX) 20 MG TABS tablet Take 1 tablet (20 mg total) by mouth daily. 03/04/19  Yes Mosie Lukes, MD     Positive ROS: Otherwise negative  All other systems have been reviewed and were otherwise negative with the exception of those mentioned in the HPI and as above.  Physical Exam: Constitutional: Alert, well-appearing, no acute distress Ears: External ears without lesions or tenderness. Ear canals are clear bilaterally with intact, clear TMs.  Nasal: External nose without lesions. Septum slightly deviated to the left..  Mild to moderate turbinate per trophy. Nasal endoscopy was performed in the office on nasal endoscopy both middle meatus regions were clear.  Has a large septal spur posteriorly on the left side.  No polyps noted.  Nasopharynx was clear. Oral: Lips and gums without lesions. Tongue and palate mucosa without lesions. Posterior oropharynx clear. Neck: No palpable adenopathy or masses Respiratory: Breathing comfortably  Skin: No facial/neck lesions or rash noted.  Nasal/sinus endoscopy  Date/Time: 03/11/2019 1:56 PM Performed by: Rozetta Nunnery, MD Authorized by: Rozetta Nunnery, MD   Consent:    Consent obtained:  Verbal   Consent given by:  Patient   Risks discussed:  Pain Procedure details:    Indications:  sino-nasal symptoms     Medication:  Afrin   Instrument: flexible fiberoptic nasal endoscope     Scope location: bilateral nare   Septum:    Deviation: deviated to the left     Severity of deviation: intermediate     Spurs: left   Sinus:    Right middle meatus: normal     Left middle meatus: normal     Right nasopharynx: normal     Left nasopharynx: normal   Comments:     Patient with moderate septal deviation to the left with a left posterior septal spur.  Middle meatus regions are clear bilaterally.  Nasopharynx clear.  No polyps noted.    Assessment: Chronic rhinitis with septal deviation to the left and left septal spur  Plan: Recommended regular use of Nasacort 2 sprays each nostril at night.  As well as saline rinse during the day.  If he does not get adequate relief of the nasal congestion could consider surgical intervention septoplasty and turbinate reductions and he will call us back if problems persist.  Presently no signs of infection.  Radene Journey, MD

## 2019-03-13 ENCOUNTER — Ambulatory Visit (HOSPITAL_BASED_OUTPATIENT_CLINIC_OR_DEPARTMENT_OTHER)
Admission: RE | Admit: 2019-03-13 | Discharge: 2019-03-13 | Disposition: A | Discharge status: Home / Self Care | Payer: No Typology Code available for payment source | Source: Ambulatory Visit | Attending: Family Medicine | Admitting: Family Medicine

## 2019-03-13 ENCOUNTER — Other Ambulatory Visit: Payer: Self-pay

## 2019-03-13 DIAGNOSIS — R7989 Other specified abnormal findings of blood chemistry: Secondary | ICD-10-CM | POA: Insufficient documentation

## 2019-03-14 ENCOUNTER — Other Ambulatory Visit: Payer: Self-pay | Admitting: Family Medicine

## 2019-03-14 DIAGNOSIS — K769 Liver disease, unspecified: Secondary | ICD-10-CM

## 2019-03-15 ENCOUNTER — Encounter: Payer: Self-pay | Admitting: Family Medicine

## 2019-03-20 ENCOUNTER — Ambulatory Visit (HOSPITAL_BASED_OUTPATIENT_CLINIC_OR_DEPARTMENT_OTHER)
Admission: RE | Admit: 2019-03-20 | Discharge: 2019-03-20 | Disposition: A | Discharge status: Home / Self Care | Payer: No Typology Code available for payment source | Source: Ambulatory Visit | Attending: Family Medicine | Admitting: Family Medicine

## 2019-03-20 ENCOUNTER — Other Ambulatory Visit: Payer: Self-pay

## 2019-03-20 DIAGNOSIS — K769 Liver disease, unspecified: Secondary | ICD-10-CM | POA: Diagnosis present

## 2019-03-20 MED ORDER — GADOBUTROL 1 MMOL/ML IV SOLN
10.0000 mL | Freq: Once | INTRAVENOUS | Status: AC | PRN
  Administered 2019-03-20: 10 mL via INTRAVENOUS

## 2019-03-28 ENCOUNTER — Encounter: Payer: Self-pay | Admitting: Family Medicine

## 2019-04-03 MED FILL — VIT D2 1.25 MG (50,000 UNIT: 1.25 MG | 28 days supply | Qty: 4 | Fill #1

## 2019-04-12 MED FILL — METOPROLOL SUCCINATE ER 50: 50 | 90 days supply | Qty: 90 | Fill #1

## 2019-05-11 MED FILL — VIT D2 1.25 MG (50,000 UNIT: 1.25 MG | 28 days supply | Qty: 4 | Fill #2

## 2019-05-29 MED FILL — HYDROCHLOROTHIAZIDE 25 MG T: 25 | 90 days supply | Qty: 90 | Fill #1

## 2019-05-29 MED FILL — LOSARTAN POTASSIUM 50 MG TA: 50 | 90 days supply | Qty: 90 | Fill #1

## 2019-06-03 ENCOUNTER — Encounter: Payer: Self-pay | Admitting: Family Medicine

## 2019-06-03 ENCOUNTER — Ambulatory Visit (INDEPENDENT_AMBULATORY_CARE_PROVIDER_SITE_OTHER): Payer: No Typology Code available for payment source | Admitting: Family Medicine

## 2019-06-03 ENCOUNTER — Other Ambulatory Visit: Payer: Self-pay

## 2019-06-03 VITALS — BP 160/98 | HR 71 | Temp 97.4°F | Resp 16 | Ht 69.0 in | Wt 255.0 lb

## 2019-06-03 DIAGNOSIS — I10 Essential (primary) hypertension: Secondary | ICD-10-CM | POA: Diagnosis not present

## 2019-06-03 DIAGNOSIS — M62838 Other muscle spasm: Secondary | ICD-10-CM

## 2019-06-03 DIAGNOSIS — R519 Headache, unspecified: Secondary | ICD-10-CM | POA: Diagnosis not present

## 2019-06-03 MED ORDER — METHOCARBAMOL 500 MG PO TABS: 500.0000 mg | ORAL_TABLET | Freq: Three times a day (TID) | ORAL | 1 refills | Status: DC | PRN

## 2019-06-03 MED ORDER — LOSARTAN POTASSIUM 100 MG PO TABS: 100.0000 mg | ORAL_TABLET | Freq: Every day | ORAL | 3 refills | Status: DC

## 2019-06-03 NOTE — Progress Notes (Signed)
Subjective:    Patient ID: Nicholas Robertson, male    DOB: 12-24-1985, 34 y.o.   MRN: [id number]  HPI HTN- chronic problem, on Metoprolol 50mg , Losartan 50mg , and HCTZ 25mg  (has been off HCTZ x2 days due to pharmacy error).  Last home BP was [id number] systolic.  When he spot checks at work it is typically 130s/80s.  + HAs (reason for visit)  HAs- taking Xyzal and using Nasacort daily.  Majority of HAs are posterior, R sided.  Thought these were tension HAs.  Started months ago.  Typically start 1-2pm and does not usually have them on weekends.  Does have 'knot' that was discovered when he had mono and hasn't really changed but will have some relief from HA w/ direct pressure on that area.  No N/V.  Mild sensitivity to light, not to sound.  No aura.  HAs initially improved w/ tylenol but he decreased intake due to liver enzymes.  Some relief w/ Aleve or ibuprofen.  Having intermittent neck pain but not radiating or radicular.   Review of Systems For ROS see HPI   This visit occurred during the SARS-CoV-2 public health emergency.  Safety protocols were in place, including screening questions prior to the visit, additional usage of staff PPE, and extensive cleaning of exam room while observing appropriate contact time as indicated for disinfecting solutions.       Objective:   Physical Exam Vitals reviewed.  Constitutional:      General: He is not in acute distress.    Appearance: He is well-developed.  HENT:     Head: Normocephalic and atraumatic.  Eyes:     Extraocular Movements: Extraocular movements intact.     Conjunctiva/sclera: Conjunctivae normal.     Pupils: Pupils are equal, round, and reactive to light.  Neck:     Thyroid: No thyromegaly.     Comments: R trap spasm Cardiovascular:     Rate and Rhythm: Normal rate and regular rhythm.     Heart sounds: Normal heart sounds. No murmur.  Pulmonary:     Effort: Pulmonary effort is normal. No respiratory distress.     Breath sounds:  Normal breath sounds.  Abdominal:     General: Bowel sounds are normal. There is no distension.     Palpations: Abdomen is soft.  Musculoskeletal:     Cervical back: Normal range of motion and neck supple.  Lymphadenopathy:     Cervical: No cervical adenopathy.  Skin:    General: Skin is warm and dry.  Neurological:     Mental Status: He is alert and oriented to person, place, and time.     Cranial Nerves: No cranial nerve deficit, dysarthria or facial asymmetry.     Coordination: Coordination normal.     Gait: Gait normal.  Psychiatric:        Mood and Affect: Mood normal.        Speech: Speech normal.        Behavior: Behavior normal.           Assessment & Plan:  Frequent HAs- new.  Suspect that this is multifactorial- stress, muscle spasm, HTN, seasonal allergies.  No red flag on hx or PE.  Pt does tend to carry his stress in his neck.  Start Robaxin, heat.  Will refer to PT for possible dry needling.  Reviewed supportive care and red flags that should prompt return.  Pt expressed understanding and is in agreement w/ plan.   Trap spasm- new.  Suspect this is the primary culprit of his regular posterior headaches.  Discussed posture.  Start Robaxin.  Heat.  Refer to PT for exercises/dry needling.  Pt expressed understanding and is in agreement w/ plan.

## 2019-06-03 NOTE — Patient Instructions (Signed)
Follow up as needed Spot check BP at work INCREASE Losartan to 100mg  daily RESTART HCTZ when it arrives CONTINUE daily allergy medication USE the Methocarbamol nightly for a week and then as needed HEAT! Tylenol as needed (try and avoid NSAIDs until BP better) We'll call you with your PT appt Call with any questions or concerns Hang in there!

## 2019-06-03 NOTE — Assessment & Plan Note (Signed)
Deteriorated.  BP is elevated today and it is unclear if this is contributing to his frequent HAs.  He has been out of HCTZ x2 days due to pharmacy error, but it seems unlikely BP would be this high.  Will increase Losartan to 100mg  daily and monitor for improvement.  Continue HCTZ and Metoprolol.

## 2019-06-14 ENCOUNTER — Encounter: Payer: Self-pay | Admitting: Family Medicine

## 2019-06-14 ENCOUNTER — Other Ambulatory Visit: Payer: Self-pay | Admitting: Family Medicine

## 2019-06-14 DIAGNOSIS — I10 Essential (primary) hypertension: Secondary | ICD-10-CM

## 2019-06-15 ENCOUNTER — Other Ambulatory Visit: Payer: Self-pay | Admitting: *Deleted

## 2019-06-15 ENCOUNTER — Encounter: Payer: Self-pay | Admitting: Physical Therapy

## 2019-06-15 ENCOUNTER — Telehealth: Payer: Self-pay | Admitting: *Deleted

## 2019-06-15 ENCOUNTER — Ambulatory Visit: Payer: No Typology Code available for payment source | Admitting: Physical Therapy

## 2019-06-15 ENCOUNTER — Other Ambulatory Visit: Payer: Self-pay

## 2019-06-15 DIAGNOSIS — E782 Mixed hyperlipidemia: Secondary | ICD-10-CM

## 2019-06-15 DIAGNOSIS — E538 Deficiency of other specified B group vitamins: Secondary | ICD-10-CM

## 2019-06-15 DIAGNOSIS — M542 Cervicalgia: Secondary | ICD-10-CM | POA: Diagnosis not present

## 2019-06-15 DIAGNOSIS — I10 Essential (primary) hypertension: Secondary | ICD-10-CM

## 2019-06-15 DIAGNOSIS — E559 Vitamin D deficiency, unspecified: Secondary | ICD-10-CM

## 2019-06-15 NOTE — Telephone Encounter (Signed)
Just need virtual in 2 weeks.  He is getting labs done at another office.

## 2019-06-15 NOTE — Telephone Encounter (Signed)
needs a virtual follow up in about 2 weeks and he is going to get back to [place] about if he wants to do labs at [facility] or [facility]. order same as last set with free T3 and free T4 thanks

## 2019-06-15 NOTE — Patient Instructions (Signed)
Access Code: [email or web address] URL: [email or web address] Date: 06/15/2019 Prepared by: Lyndee Hensen  Exercises Seated Cervical Sidebending Stretch - 3 x daily - 3 reps - 30 hold Seated Scapular Retraction - 3 x daily - 1 sets - 10 reps Standing Backward Shoulder Rolls - 3 x daily - 1 sets - 10 reps Seated Cervical Retraction - 2 x daily - 1 sets - 10 reps

## 2019-06-16 ENCOUNTER — Other Ambulatory Visit: Payer: Self-pay | Admitting: Family Medicine

## 2019-06-16 DIAGNOSIS — I10 Essential (primary) hypertension: Secondary | ICD-10-CM

## 2019-06-16 NOTE — Therapy (Signed)
[facility] [address] [place], [place], [address] Phone: [phone]   Fax:  [phone]  Physical Therapy Evaluation  Patient Details  Name: Nicholas Robertson MRN: [id number] Date of Birth: [birth year] Referring Provider (PT): Penni Homans   Encounter Date: 06/15/2019  PT End of Session - 06/15/19 2137    Visit Number  1    Number of Visits  12    Date for PT Re-Evaluation  07/27/19    Authorization Type  [facility]    PT Start Time  1600    PT Stop Time  1642    PT Time Calculation (min)  42 min    Activity Tolerance  Patient tolerated treatment well    Behavior During Therapy  [facility] for tasks assessed/performed       Past Medical History:  Diagnosis Date  . Abnormal LFTs    . Deficiency of vitamin B12 09/12/2016  . Fever, unspecified 07/31/2013  . HTN (hypertension)    . Other and unspecified hyperlipidemia    . Preventative health care 04/18/2013  . Strep pharyngitis 10/20/2012  . WPW (Wolff-Parkinson-White syndrome)     s/p ablation 02/2013 by [name]    Past Surgical History:  Procedure Laterality Date  . ABLATION  03/09/2013   EPS and RFCA of manifest right posteroloateral accessory pathway  . SUPRAVENTRICULAR TACHYCARDIA ABLATION N/A 03/08/2013   Procedure: WPW ABLATION;  Surgeon: [name], MD;  Location: [facility] CATH LAB;  Service: Cardiovascular;  Laterality: N/A;    There were no vitals filed for this visit.   Subjective Assessment - 06/15/19 2131    Subjective  Pt states pain and tightness in R side of neck, as well as sub occiptial region and headache. Pain for about 2 months, no previous pain. 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    Patient Stated Goals  decreased pain    Currently in Pain?  Yes    Pain Score  4     Pain Location  Neck    Pain Orientation  Right    Pain Descriptors / Indicators  Aching;Tightness    Pain Type  Acute pain    Pain Onset  1 to 4 weeks ago    Pain  Frequency  Intermittent    Aggravating Factors   work duties, Teaching laboratory technician work,         [facility] PT Assessment - 06/16/19 0001      Assessment   Medical Diagnosis  Neck pain, headache    Referring Provider (PT)  Penni Homans    Hand Dominance  Right    Prior Therapy  no      Balance Screen   Has the patient fallen in the past 6 months  No      Prior Function   Level of Independence  Independent      Cognition   Overall Cognitive Status  Within Functional Limits for tasks assessed      Posture/Postural Control   Posture Comments  mild fwd head       AROM   Overall AROM Comments  shoulder: WNL    Cervical Flexion  wfl    Cervical Extension  mild deficit    Cervical - Right Side Bend  wfl    Cervical - Left Side Bend  mild deficit    Cervical - Right Rotation  wfl/pain    Cervical - Left Rotation  wfl/pain      Strength  Overall Strength Comments  Shoulders: 4+/5, scapular: 4/5       Palpation   Palpation comment  Pain and tenderness in R UT, and SO, mild pain in parspinals.       Special Tests   Other special tests  Neg ULTT, no radicular pain.                   Objective measurements completed on examination: See above findings.      [facility] Adult PT Treatment/Exercise - 06/16/19 0001      Exercises   Exercises  Neck      Neck Exercises: Seated   Neck Retraction  10 reps    Shoulder Rolls  10 reps    Other Seated Exercise  Scap retract x10;       Manual Therapy   Manual Therapy  Soft tissue mobilization;Passive ROM;Manual Traction    Manual therapy comments  skilled palpation and monitoring of soft tissue with dry needling    Soft tissue mobilization  DTM to R upper trap, cervical paraspinals, SOR    Manual Traction  10 sec x 8 , cervical;       Neck Exercises: Stretches   Upper Trapezius Stretch  2 reps;30 seconds       Trigger Point Dry Needling - 06/16/19 0001    Consent Given?  Yes    Education Handout Provided  Yes    Muscles Treated  Head and Neck  Upper trapezius    Upper Trapezius Response  Twitch reponse elicited;Palpable increased muscle length   R          PT Education - 06/15/19 2137    Education Details  PT POC, Exam findings, HEP, Posture, Education on Dry needling.    Person(s) Educated  Patient    Methods  Explanation;Demonstration;Tactile cues;Verbal cues;Handout    Comprehension  Verbalized understanding;Returned demonstration;Verbal cues required;Tactile cues required;Need further instruction       PT Short Term Goals - 06/16/19 0745      PT SHORT TERM GOAL #1   Title  Pt to be independent with initial HEP    Time  2    Period  Weeks    Status  New    Target Date  06/29/19      PT SHORT TERM GOAL #2   Title  Pt to report decreased pain in neck to 0-3/10    Time  2    Period  Weeks    Status  New    Target Date  06/29/19        PT Long Term Goals - 06/16/19 0749      PT LONG TERM GOAL #1   Title  Pt to be independent with final HEP    Time  6    Period  Weeks    Status  New    Target Date  07/27/19      PT LONG TERM GOAL #2   Title  Pt to demo decreased pain in cervical region to 0-1/10 with UE activity and work duties .    Time  6    Period  Weeks    Status  New    Target Date  07/27/19      PT LONG TERM GOAL #3   Title  Pt to demo improved soft tissue restrictions to be WNL in cervical region, to decrease pain and headache.    Time  6    Period  Weeks    Status  New    Target Date  07/27/19      PT LONG TERM GOAL #4   Title  Pt to demo ability to self correct posture at least 75% of the time in clinic.    Time  6    Period  Weeks    Status  New    Target Date  07/27/19             Plan - 06/16/19 0740    Clinical Impression Statement  Pt presents with primary complaint of increased pain in R side of neck. He has tightness and painful trigger points in R  UT and Sub occipitals. Pt with poor seated posture, and will benefit from education on posture and HEP.  Pt with decreased abilty for full functional activities and work duties , due to pain. Pt will benefit from skilled PT to improve deficits and pain. Pt with good tolerance for manual and dry needling today, will benefit from other dry needling for SO.    Examination-Activity Limitations  Reach Overhead;Sleep;Lift;Carry    Examination-Participation Restrictions  Community Activity;Yard Work;Driving;Meal Prep    Stability/Clinical Decision Making  Stable/Uncomplicated    Clinical Decision Making  Low    Rehab Potential  Good    PT Frequency  2x / week    PT Duration  6 weeks    PT Treatment/Interventions  ADLs/Self Care Home Management;Cryotherapy;Electrical Stimulation;Iontophoresis 4mg /ml Dexamethasone;Moist Heat;Traction;Ultrasound;Gait training;Neuromuscular re-education;Therapeutic exercise;Therapeutic activities;Functional mobility training;Patient/family education;Manual techniques;Vasopneumatic Device;Taping;Dry needling;Passive range of motion;Spinal Manipulations;Joint Manipulations    Consulted and Agree with Plan of Care  Patient       Patient will benefit from skilled therapeutic intervention in order to improve the following deficits and impairments:  Increased muscle spasms, Decreased activity tolerance, Pain, Improper body mechanics, Impaired flexibility, Decreased strength  Visit Diagnosis: Cervicalgia     Problem List Patient Active Problem List   Diagnosis Date Noted  . Vitamin D deficiency 03/08/2019  . Seborrhea 03/08/2019  . Ingrown toenail of both feet 07/05/2018  . Vitamin B12 deficiency 09/12/2016  . Anxiety and depression 04/18/2013  . Preventative health care 04/18/2013  . Wolff-Parkinson-White (WPW) syndrome 02/17/2013  . HTN (hypertension) 02/07/2013  . Hyperlipidemia 02/07/2013  . Abnormal LFTs 02/07/2013    Lyndee Hensen, PT, DPT 8:03 AM  06/16/19    [facility] [place], [place],  [address] Phone: [phone]   Fax:  [phone]  Name: ADIS GRANGER MRN: [id number] Date of Birth: May 03, 1985

## 2019-06-16 NOTE — Telephone Encounter (Signed)
Left message on pt's voice mail for him to call me back to possibly schedule a virtual visit f/u with Dr. Charlett Blake on June 7 or 8 in the afternoon.

## 2019-06-18 ENCOUNTER — Telehealth: Payer: Self-pay | Admitting: Family Medicine

## 2019-06-18 MED ORDER — AMLODIPINE BESYLATE 5 MG PO TABS: 5.0000 mg | ORAL_TABLET | Freq: Every day | ORAL | 3 refills | Status: DC

## 2019-06-18 NOTE — Telephone Encounter (Signed)
Pt calls indicating that BP has been running 150s/100s for the last week or so.  Already on Metoprolol 50mg  daily, Losartan 100mg  daily, and HCTZ 25mg  daily.  Has upcoming renal artery [place] to assess for underlying cause of HTN.  In the meantime has had HAs and some dizziness.  Will add Amlodipine 5mg  daily and since his HR is already at times in the 50s, he will decrease his Metoprolol to 1/2 tab daily.  Pt expressed understanding and is in agreement w/ plan.

## 2019-06-21 ENCOUNTER — Other Ambulatory Visit: Payer: Self-pay

## 2019-06-21 ENCOUNTER — Ambulatory Visit (HOSPITAL_COMMUNITY)
Admission: RE | Admit: 2019-06-21 | Discharge: 2019-06-21 | Disposition: A | Discharge status: Home / Self Care | Payer: No Typology Code available for payment source | Source: Ambulatory Visit | Attending: Family Medicine | Admitting: Family Medicine

## 2019-06-21 DIAGNOSIS — I10 Essential (primary) hypertension: Secondary | ICD-10-CM

## 2019-06-21 MED FILL — TRINTELLIX 20 MG TABLET: 20 | 90 days supply | Qty: 90 | Fill #1

## 2019-06-21 MED FILL — ATORVASTATIN CALCIUM 10 MG: 10 | 90 days supply | Qty: 90 | Fill #1

## 2019-06-21 NOTE — Progress Notes (Signed)
Renal artery duplex has been completed.   Preliminary results in CV Proc.   Abram Sander 06/21/2019 8:48 AM

## 2019-06-22 ENCOUNTER — Ambulatory Visit (INDEPENDENT_AMBULATORY_CARE_PROVIDER_SITE_OTHER): Payer: No Typology Code available for payment source

## 2019-06-22 DIAGNOSIS — I10 Essential (primary) hypertension: Secondary | ICD-10-CM

## 2019-06-22 DIAGNOSIS — E782 Mixed hyperlipidemia: Secondary | ICD-10-CM

## 2019-06-22 DIAGNOSIS — E538 Deficiency of other specified B group vitamins: Secondary | ICD-10-CM | POA: Diagnosis not present

## 2019-06-22 DIAGNOSIS — E559 Vitamin D deficiency, unspecified: Secondary | ICD-10-CM | POA: Diagnosis not present

## 2019-06-22 LAB — VITAMIN D 25 HYDROXY (VIT D DEFICIENCY, FRACTURES): VITD: 32.4 ng/mL (ref 30.00–100.00)

## 2019-06-22 LAB — COMPREHENSIVE METABOLIC PANEL
ALT: 106 U/L — ABNORMAL HIGH (ref 0–53)
AST: 47 U/L — ABNORMAL HIGH (ref 0–37)
Albumin: 4.7 g/dL (ref 3.5–5.2)
Alkaline Phosphatase: 58 U/L (ref 39–117)
BUN: 14 mg/dL (ref 6–23)
CO2: 28 mEq/L (ref 19–32)
Calcium: 9.6 mg/dL (ref 8.4–10.5)
Chloride: 100 mEq/L (ref 96–112)
Creatinine, Ser: 0.87 mg/dL (ref 0.40–1.50)
GFR: 100.58 mL/min (ref >60.00)
Glucose, Bld: 103 mg/dL — ABNORMAL HIGH (ref 70–99)
Potassium: 3.6 mEq/L (ref 3.5–5.1)
Sodium: 138 mEq/L (ref 135–145)
Total Bilirubin: 0.8 mg/dL (ref 0.2–1.2)
Total Protein: 6.9 g/dL (ref 6.0–8.3)

## 2019-06-22 LAB — LIPID PANEL
Cholesterol: 181 mg/dL (ref 0–200)
HDL: 48.7 mg/dL (ref >39.00)
LDL Cholesterol: 103 mg/dL — ABNORMAL HIGH (ref 0–99)
NonHDL: 132.36
Total CHOL/HDL Ratio: 4
Triglycerides: 149 mg/dL (ref 0.0–149.0)
VLDL: 29.8 mg/dL (ref 0.0–40.0)

## 2019-06-22 LAB — CBC
HCT: 45.4 % (ref 39.0–52.0)
Hemoglobin: 16 g/dL (ref 13.0–17.0)
MCHC: 35.2 g/dL (ref 30.0–36.0)
MCV: 92.6 fl (ref 78.0–100.0)
Platelets: 216 10*3/uL (ref 150.0–400.0)
RBC: 4.9 Mil/uL (ref 4.22–5.81)
RDW: 12.9 % (ref 11.5–15.5)
WBC: 7.2 10*3/uL (ref 4.0–10.5)

## 2019-06-22 LAB — VITAMIN B12: Vitamin B-12: 474 pg/mL (ref 211–911)

## 2019-06-22 LAB — T3, FREE: T3, Free: 4 pg/mL (ref 2.3–4.2)

## 2019-06-22 LAB — TSH: TSH: 1.66 u[IU]/mL (ref 0.35–4.50)

## 2019-06-22 LAB — T4, FREE: Free T4: 0.73 ng/dL (ref 0.60–1.60)

## 2019-06-23 ENCOUNTER — Encounter: Payer: Self-pay | Admitting: Family Medicine

## 2019-06-24 ENCOUNTER — Other Ambulatory Visit: Payer: Self-pay

## 2019-06-24 ENCOUNTER — Encounter: Payer: Self-pay | Admitting: Physical Therapy

## 2019-06-24 ENCOUNTER — Ambulatory Visit: Payer: No Typology Code available for payment source | Admitting: Physical Therapy

## 2019-06-24 DIAGNOSIS — M542 Cervicalgia: Secondary | ICD-10-CM | POA: Diagnosis not present

## 2019-06-24 NOTE — Therapy (Signed)
[facility] [address] [place], [place], [address] Phone: [phone]   Fax:  [phone]  Physical Therapy Treatment  Patient Details  Name: Nicholas Robertson MRN: [id number] Date of Birth: [birth year] Referring Provider (PT): Penni Homans   Encounter Date: 06/24/2019  PT End of Session - 06/24/19 1511    Visit Number  2    Number of Visits  12    Date for PT Re-Evaluation  07/27/19    Authorization Type  [facility]    PT Start Time  1430    PT Stop Time  1507    PT Time Calculation (min)  37 min    Activity Tolerance  Patient tolerated treatment well    Behavior During Therapy  [facility] for tasks assessed/performed       Past Medical History:  Diagnosis Date  . Abnormal LFTs    . Deficiency of vitamin B12 09/12/2016  . Fever, unspecified 07/31/2013  . HTN (hypertension)    . Other and unspecified hyperlipidemia    . Preventative health care 04/18/2013  . Strep pharyngitis 10/20/2012  . WPW (Wolff-Parkinson-White syndrome)     s/p ablation 02/2013 by [name]    Past Surgical History:  Procedure Laterality Date  . ABLATION  03/09/2013   EPS and RFCA of manifest right posteroloateral accessory pathway  . SUPRAVENTRICULAR TACHYCARDIA ABLATION N/A 03/08/2013   Procedure: WPW ABLATION;  Surgeon: [name], MD;  Location: [facility] CATH LAB;  Service: Cardiovascular;  Laterality: N/A;    There were no vitals filed for this visit.  Subjective Assessment - 06/24/19 1510    Subjective  Pt states less pain in neck and UT, less intense headaches. Has been donig HEP    Currently in Pain?  Yes    Pain Score  2     Pain Location  Neck    Pain Orientation  Right    Pain Descriptors / Indicators  Aching;Tightness    Pain Type  Acute pain    Pain Onset  More than a month ago    Pain Frequency  Intermittent                        [facility] Adult PT Treatment/Exercise - 06/24/19 0001      Neck Exercises: Theraband   Scapula  Retraction  20 reps    Scapula Retraction Limitations  Low Row    Rows  20 reps;Green      Neck Exercises: Seated   Neck Retraction  10 reps      Neck Exercises: Supine   Neck Retraction  10 reps      Manual Therapy   Manual Therapy  Joint mobilization    Manual therapy comments  skilled palpation and monitoring of soft tissue with dry needling    Joint Mobilization  PA mobs- c-spine,     Soft tissue mobilization  DTM to R upper trap, cervical paraspinals, SOR    Manual Traction  15 sec x 10 , cervical;        Trigger Point Dry Needling - 06/24/19 0001    Consent Given?  Yes    Education Handout Provided  Previously provided    Muscles Treated Head and Neck  Suboccipitals    Upper Trapezius Response  Twitch reponse elicited;Palpable increased muscle length   L and R   Suboccipitals Response  Palpable increased muscle length   R  PT Education - 06/24/19 1511    Education Details  HEP updated    Person(s) Educated  Patient    Methods  Explanation;Demonstration;Verbal cues    Comprehension  Verbalized understanding;Returned demonstration;Verbal cues required       PT Short Term Goals - 06/16/19 0745      PT SHORT TERM GOAL #1   Title  Pt to be independent with initial HEP    Time  2    Period  Weeks    Status  New    Target Date  06/29/19      PT SHORT TERM GOAL #2   Title  Pt to report decreased pain in neck to 0-3/10    Time  2    Period  Weeks    Status  New    Target Date  06/29/19        PT Long Term Goals - 06/16/19 0749      PT LONG TERM GOAL #1   Title  Pt to be independent with final HEP    Time  6    Period  Weeks    Status  New    Target Date  07/27/19      PT LONG TERM GOAL #2   Title  Pt to demo decreased pain in cervical region to 0-1/10 with UE activity and work duties .    Time  6    Period  Weeks    Status  New    Target Date  07/27/19      PT LONG TERM GOAL #3   Title  Pt to demo improved soft tissue restrictions to  be WNL in cervical region, to decrease pain and headache.    Time  6    Period  Weeks    Status  New    Target Date  07/27/19      PT LONG TERM GOAL #4   Title  Pt to demo ability to self correct posture at least 75% of the time in clinic.    Time  6    Period  Weeks    Status  New    Target Date  07/27/19            Plan - 06/24/19 1512    Clinical Impression Statement  Focus on manual and DN for muslce tension and SOR today. Pt with decreased tenderness with palaption to SO and UT from previous visit. Plan to progress as tolerated. HEP updated .    Examination-Activity Limitations  Reach Overhead;Sleep;Lift;Carry    Examination-Participation Restrictions  Community Activity;Yard Work;Driving;Meal Prep    Stability/Clinical Decision Making  Stable/Uncomplicated    Rehab Potential  Good    PT Frequency  2x / week    PT Duration  6 weeks    PT Treatment/Interventions  ADLs/Self Care Home Management;Cryotherapy;Electrical Stimulation;Iontophoresis 4mg /ml Dexamethasone;Moist Heat;Traction;Ultrasound;Gait training;Neuromuscular re-education;Therapeutic exercise;Therapeutic activities;Functional mobility training;Patient/family education;Manual techniques;Vasopneumatic Device;Taping;Dry needling;Passive range of motion;Spinal Manipulations;Joint Manipulations    Consulted and Agree with Plan of Care  Patient       Patient will benefit from skilled therapeutic intervention in order to improve the following deficits and impairments:  Increased muscle spasms, Decreased activity tolerance, Pain, Improper body mechanics, Impaired flexibility, Decreased strength  Visit Diagnosis: Cervicalgia     Problem List Patient Active Problem List   Diagnosis Date Noted  . Vitamin D deficiency 03/08/2019  . Seborrhea 03/08/2019  . Ingrown toenail of both feet 07/05/2018  . Vitamin B12 deficiency 09/12/2016  .  Anxiety and depression 04/18/2013  . Preventative health care 04/18/2013  .  Wolff-Parkinson-White (WPW) syndrome 02/17/2013  . HTN (hypertension) 02/07/2013  . Hyperlipidemia 02/07/2013  . Abnormal LFTs 02/07/2013    Lyndee Hensen, PT, DPT 3:13 PM  06/24/19    [facility], [place], [address] Phone: [phone]   Fax:  [phone]  Name: Nicholas Robertson MRN: [id number] Date of Birth: 10/27/1985

## 2019-07-01 ENCOUNTER — Other Ambulatory Visit: Payer: Self-pay

## 2019-07-01 ENCOUNTER — Ambulatory Visit: Payer: No Typology Code available for payment source | Admitting: Physical Therapy

## 2019-07-01 ENCOUNTER — Encounter: Payer: Self-pay | Admitting: Physical Therapy

## 2019-07-01 DIAGNOSIS — M542 Cervicalgia: Secondary | ICD-10-CM | POA: Diagnosis not present

## 2019-07-01 NOTE — Patient Instructions (Signed)
Access Code: [email or web address] URL: [email or web address] Date: 06/15/2019 Prepared by: Lyndee Hensen  Exercises Seated Cervical Sidebending Stretch - 3 x daily - 3 reps - 30 hold Seated Scapular Retraction - 3 x daily - 1 sets - 10 reps Standing Backward Shoulder Rolls - 3 x daily - 1 sets - 10 reps Seated Cervical Retraction - 2 x daily - 1 sets - 10 reps Doorway Pec Stretch at 90 Degrees Abduction - 2 x daily - 3 reps Supine Pectoralis Stretch - 2 x daily - 3 reps - 30 hold Scapular Retraction with Resistance - 1 x daily - 2 sets - 10 reps Standing Shoulder External Rotation with Resistance - 1 x daily - 2 sets - 10 reps Standing Shoulder Scaption - 1 x daily - 2 sets - 10 reps Prone Scapular Retraction Arms at Side - 1 x daily - 2 sets - 10 reps

## 2019-07-01 NOTE — Therapy (Signed)
[facility] [address] [place], [place], [address] Phone: [phone]   Fax:  [phone]  Physical Therapy Treatment  Patient Details  Name: Nicholas Robertson MRN: [id number] Date of Birth: 1985/06/12 Referring Provider (PT): Penni Homans   Encounter Date: 07/01/2019  PT End of Session - 07/01/19 1311    Visit Number  3    Number of Visits  12    Date for PT Re-Evaluation  07/27/19    Authorization Type  [facility]    PT Start Time  1300    PT Stop Time  1336    PT Time Calculation (min)  36 min    Activity Tolerance  Patient tolerated treatment well    Behavior During Therapy  [facility] for tasks assessed/performed       Past Medical History:  Diagnosis Date  . Abnormal LFTs    . Deficiency of vitamin B12 09/12/2016  . Fever, unspecified 07/31/2013  . HTN (hypertension)    . Other and unspecified hyperlipidemia    . Preventative health care 04/18/2013  . Strep pharyngitis 10/20/2012  . WPW (Wolff-Parkinson-White syndrome)     s/p ablation 02/2013 by [name]    Past Surgical History:  Procedure Laterality Date  . ABLATION  03/09/2013   EPS and RFCA of manifest right posteroloateral accessory pathway  . SUPRAVENTRICULAR TACHYCARDIA ABLATION N/A 03/08/2013   Procedure: WPW ABLATION;  Surgeon: [name], MD;  Location: [facility] CATH LAB;  Service: Cardiovascular;  Laterality: N/A;    There were no vitals filed for this visit.                     [facility] Adult PT Treatment/Exercise - 07/01/19 1303      Neck Exercises: Theraband   Scapula Retraction  20 reps    Scapula Retraction Limitations  Low Row    Rows  20 reps;Green    Shoulder External Rotation  20 reps;Red    Shoulder External Rotation Limitations  bil      Neck Exercises: Standing   Other Standing Exercises  UE scaption with educatoin on form for shoulders/neck x 15;       Neck Exercises: Seated   Neck Retraction  10 reps      Neck Exercises: Supine    Neck Retraction  --      Neck Exercises: Prone   Other Prone Exercise  Prone T x 1 5 ;       Manual Therapy   Manual Therapy  Joint mobilization    Manual therapy comments  --    Joint Mobilization  PA mobs- c-spine,     Soft tissue mobilization  DTM to R upper trap, cervical paraspinals, SOR    Manual Traction  15 sec x 10 , cervical;       Neck Exercises: Stretches   Upper Trapezius Stretch  2 reps;30 seconds    Other Neck Stretches  Supine pec stretch x 1 min;       Other Neck Stretches  Doorway stretch 30 sec x 3 at 90 deg;              PT Education - 07/01/19 1342    Education Details  HEP updated    Person(s) Educated  Patient    Methods  Explanation;Demonstration;Tactile cues;Verbal cues;Handout    Comprehension  Verbalized understanding;Returned demonstration;Verbal cues required;Tactile cues required;Need further instruction       PT Short Term Goals - 06/16/19 0745  PT SHORT TERM GOAL #1   Title  Pt to be independent with initial HEP    Time  2    Period  Weeks    Status  New    Target Date  06/29/19      PT SHORT TERM GOAL #2   Title  Pt to report decreased pain in neck to 0-3/10    Time  2    Period  Weeks    Status  New    Target Date  06/29/19        PT Long Term Goals - 06/16/19 0749      PT LONG TERM GOAL #1   Title  Pt to be independent with final HEP    Time  6    Period  Weeks    Status  New    Target Date  07/27/19      PT LONG TERM GOAL #2   Title  Pt to demo decreased pain in cervical region to 0-1/10 with UE activity and work duties .    Time  6    Period  Weeks    Status  New    Target Date  07/27/19      PT LONG TERM GOAL #3   Title  Pt to demo improved soft tissue restrictions to be WNL in cervical region, to decrease pain and headache.    Time  6    Period  Weeks    Status  New    Target Date  07/27/19      PT LONG TERM GOAL #4   Title  Pt to demo ability to self correct posture at least 75% of the time in  clinic.    Time  6    Period  Weeks    Status  New    Target Date  07/27/19            Plan - 07/01/19 1343    Clinical Impression Statement  Pt with good improvments in pain. Mild tightness/sorness in R side Sub occipitals, but much improved from previous weeks. Ther ex performed for posture and scapular strengthening, as well as education on shoulder, neck posture with UE elevation. Pt with weakness and dificulty noted with this, weill benefit from continued practice. HEp updated today. Pt to benefit from continued care.    Examination-Activity Limitations  Reach Overhead;Sleep;Lift;Carry    Examination-Participation Restrictions  Community Activity;Yard Work;Driving;Meal Prep    Stability/Clinical Decision Making  Stable/Uncomplicated    Rehab Potential  Good    PT Frequency  2x / week    PT Duration  6 weeks    PT Treatment/Interventions  ADLs/Self Care Home Management;Cryotherapy;Electrical Stimulation;Iontophoresis 4mg /ml Dexamethasone;Moist Heat;Traction;Ultrasound;Gait training;Neuromuscular re-education;Therapeutic exercise;Therapeutic activities;Functional mobility training;Patient/family education;Manual techniques;Vasopneumatic Device;Taping;Dry needling;Passive range of motion;Spinal Manipulations;Joint Manipulations    Consulted and Agree with Plan of Care  Patient       Patient will benefit from skilled therapeutic intervention in order to improve the following deficits and impairments:  Increased muscle spasms, Decreased activity tolerance, Pain, Improper body mechanics, Impaired flexibility, Decreased strength  Visit Diagnosis: Cervicalgia     Problem List Patient Active Problem List   Diagnosis Date Noted  . Vitamin D deficiency 03/08/2019  . Seborrhea 03/08/2019  . Ingrown toenail of both feet 07/05/2018  . Vitamin B12 deficiency 09/12/2016  . Anxiety and depression 04/18/2013  . Preventative health care 04/18/2013  . Wolff-Parkinson-White (WPW) syndrome  02/17/2013  . HTN (hypertension) 02/07/2013  . Hyperlipidemia 02/07/2013  .  Abnormal LFTs 02/07/2013    Lyndee Hensen, PT, DPT 1:47 PM  07/01/19    [facility] [place], [place], [address] Phone: [phone]   Fax:  [phone]  Name: Nicholas Robertson MRN: [id number] Date of Birth: 28-May-1985

## 2019-07-08 ENCOUNTER — Ambulatory Visit: Payer: No Typology Code available for payment source | Admitting: Physical Therapy

## 2019-07-08 ENCOUNTER — Other Ambulatory Visit: Payer: Self-pay

## 2019-07-08 DIAGNOSIS — M542 Cervicalgia: Secondary | ICD-10-CM | POA: Diagnosis not present

## 2019-07-09 ENCOUNTER — Encounter: Payer: Self-pay | Admitting: Physical Therapy

## 2019-07-09 NOTE — Therapy (Addendum)
[facility] [address] [place], [place], [address] Phone: [phone]   Fax:  [phone]  Physical Therapy Treatment  Patient Details  Name: Nicholas Robertson MRN: [id number] Date of Birth: 08-Oct-1985 Referring Provider (PT): Penni Homans   Encounter Date: 07/08/2019   PT End of Session - 07/09/19 1603    Visit Number 4    Number of Visits 12    Date for PT Re-Evaluation 07/27/19    Authorization Type [facility]    PT Start Time 1302    PT Stop Time 1341    PT Time Calculation (min) 39 min    Activity Tolerance Patient tolerated treatment well    Behavior During Therapy [facility] for tasks assessed/performed           Past Medical History:  Diagnosis Date  . Abnormal LFTs    . Deficiency of vitamin B12 09/12/2016  . Fever, unspecified 07/31/2013  . HTN (hypertension)    . Other and unspecified hyperlipidemia    . Preventative health care 04/18/2013  . Strep pharyngitis 10/20/2012  . WPW (Wolff-Parkinson-White syndrome)     s/p ablation 02/2013 by [name]    Past Surgical History:  Procedure Laterality Date  . ABLATION  03/09/2013   EPS and RFCA of manifest right posteroloateral accessory pathway  . SUPRAVENTRICULAR TACHYCARDIA ABLATION N/A 03/08/2013   Procedure: WPW ABLATION;  Surgeon: [name], MD;  Location: [facility] CATH LAB;  Service: Cardiovascular;  Laterality: N/A;    There were no vitals filed for this visit.   Subjective Assessment - 07/09/19 1601    Subjective Pt states improving pain, very little. Has been doing HEP and postural exercises during the day.    Patient Stated Goals decreased pain    Currently in Pain? No/denies    Pain Score 0-No pain                             [facility] Adult PT Treatment/Exercise - 07/09/19 0001      Neck Exercises: Theraband   Scapula Retraction 20 reps    Scapula Retraction Limitations Low Row    Rows 20 reps;Green    Shoulder External Rotation 20 reps;Red     Shoulder External Rotation Limitations bil    Horizontal ABduction 20 reps;Red      Neck Exercises: Standing   Other Standing Exercises UE scaption , full ROM x 15, with education on shoulder posture and back posutre.       Neck Exercises: Seated   Neck Retraction 10 reps      Manual Therapy   Manual Therapy Joint mobilization    Manual therapy comments skilled palpation and monitoring of soft tissue with dry needling    Joint Mobilization PA mobs- c-spine, side glides    Soft tissue mobilization DTM to R upper trap, bil cervical paraspinals, SOR    Manual Traction 15 sec x 10 , cervical;       Neck Exercises: Stretches   Upper Trapezius Stretch 2 reps;30 seconds    Other Neck Stretches Supine pec stretch x 1 min;       Other Neck Stretches Doorway stretch 30 sec x 3 at 90 deg;             Trigger Point Dry Needling - 07/09/19 0001    Consent Given? Yes    Education Handout Provided Previously provided    Muscles Treated Head and Neck Upper  trapezius;Splenius capitus;Levator scapulae    Upper Trapezius Response Twitch reponse elicited;Palpable increased muscle length   R   Levator Scapulae Response Twitch response elicited;Palpable increased muscle length   R   Splenius capitus Response Palpable increased muscle length   R               PT Education - 07/09/19 1603    Education Details HEp reviewed    Person(s) Educated Patient    Methods Explanation;Demonstration;Verbal cues;Handout    Comprehension Verbalized understanding;Returned demonstration            PT Short Term Goals - 07/09/19 1604      PT SHORT TERM GOAL #1   Title Pt to be independent with initial HEP    Time 2    Period Weeks    Status Achieved    Target Date 06/29/19      PT SHORT TERM GOAL #2   Title Pt to report decreased pain in neck to 0-3/10    Time 2    Period Weeks    Status Achieved    Target Date 06/29/19             PT Long Term Goals - 07/09/19 1605      PT LONG  TERM GOAL #1   Title Pt to be independent with final HEP    Time 6    Period Weeks    Status Achieved      PT LONG TERM GOAL #2   Title Pt to demo decreased pain in cervical region to 0-1/10 with UE activity and work duties .    Time 6    Period Weeks    Status Achieved      PT LONG TERM GOAL #3   Title Pt to demo improved soft tissue restrictions to be WNL in cervical region, to decrease pain and headache.    Time 6    Period Weeks    Status Achieved      PT LONG TERM GOAL #4   Title Pt to demo ability to self correct posture at least 75% of the time in clinic.    Time 6    Period Weeks    Status Achieved                 Plan - 07/09/19 1605    Clinical Impression Statement Pt has made good progress. He reports minimal pain in neck, just feels tightness from time to time or after sleeping. HEP updated, THer ex progressed for UE, scapular and postural strength. Discussed importance of posture and continueing HEP. Pt doing well at this time, has met goals. Will hold PT at this time, pt will return in next 2 weeks if he has increased pain, otherwise will D/C. Pt in agreement with plan.    Examination-Activity Limitations Reach Overhead;Sleep;Lift;Carry    Examination-Participation Restrictions Community Activity;Yard Work;Driving;Meal Prep    Stability/Clinical Decision Making Stable/Uncomplicated    Rehab Potential Good    PT Frequency 2x / week    PT Duration 6 weeks    PT Treatment/Interventions ADLs/Self Care Home Management;Cryotherapy;Electrical Stimulation;Iontophoresis 51m/ml Dexamethasone;Moist Heat;Traction;Ultrasound;Gait training;Neuromuscular re-education;Therapeutic exercise;Therapeutic activities;Functional mobility training;Patient/family education;Manual techniques;Vasopneumatic Device;Taping;Dry needling;Passive range of motion;Spinal Manipulations;Joint Manipulations    Consulted and Agree with Plan of Care Patient           Patient will benefit from  skilled therapeutic intervention in order to improve the following deficits and impairments:  Increased muscle spasms, Decreased activity tolerance, Pain, Improper  body mechanics, Impaired flexibility, Decreased strength  Visit Diagnosis: Cervicalgia     Problem List Patient Active Problem List   Diagnosis Date Noted  . Vitamin D deficiency 03/08/2019  . Seborrhea 03/08/2019  . Ingrown toenail of both feet 07/05/2018  . Vitamin B12 deficiency 09/12/2016  . Anxiety and depression 04/18/2013  . Preventative health care 04/18/2013  . Wolff-Parkinson-White (WPW) syndrome 02/17/2013  . HTN (hypertension) 02/07/2013  . Hyperlipidemia 02/07/2013  . Abnormal LFTs 02/07/2013    Lyndee Hensen, PT, DPT 4:10 PM  07/09/19    [facility] [place], [place], [address] Phone: [phone]   Fax:  [phone]  Name: Nicholas Robertson MRN: [id number] Date of Birth: 05-27-1985   PHYSICAL THERAPY DISCHARGE SUMMARY  Visits from Start of Care: 4  Plan: Patient agrees to discharge.  Patient goals were met. Patient is being discharged due to meeting the stated rehab goals.  ?????    Lyndee Hensen, PT, DPT 2:58 PM  02/16/20

## 2019-07-09 NOTE — Patient Instructions (Signed)
Access Code: [email or web address] URL: [email or web address] Date: 07/09/2019 Prepared by: Lyndee Hensen  Exercises Seated Cervical Sidebending Stretch - 3 x daily - 3 reps - 30 hold Seated Scapular Retraction - 3 x daily - 1 sets - 10 reps Standing Backward Shoulder Rolls - 3 x daily - 1 sets - 10 reps Seated Cervical Retraction - 2 x daily - 1 sets - 10 reps Doorway Pec Stretch at 90 Degrees Abduction - 2 x daily - 3 reps Supine Pectoralis Stretch - 2 x daily - 3 reps - 30 hold Scapular Retraction with Resistance - 1 x daily - 2 sets - 10 reps Standing Shoulder External Rotation with Resistance - 1 x daily - 2 sets - 10 reps Standing Shoulder Scaption - 1 x daily - 2 sets - 10 reps Prone Scapular Retraction Arms at Side - 1 x daily - 2 sets - 10 reps

## 2019-07-12 MED FILL — METOPROLOL SUCCINATE ER 50: 50 | 90 days supply | Qty: 90 | Fill #2

## 2019-07-19 ENCOUNTER — Other Ambulatory Visit: Payer: Self-pay | Admitting: Family Medicine

## 2019-07-19 MED ORDER — LOSARTAN POTASSIUM 100 MG PO TABS: 100.0000 mg | ORAL_TABLET | Freq: Every day | ORAL | 3 refills | Status: DC

## 2019-07-19 MED FILL — LOSARTAN POTASSIUM 100 MG T: 100 | 90 days supply | Qty: 90 | Fill #0

## 2019-07-19 NOTE — Progress Notes (Signed)
Prescription filled at pt's request ?

## 2019-07-29 ENCOUNTER — Other Ambulatory Visit: Payer: Self-pay

## 2019-07-29 ENCOUNTER — Telehealth (INDEPENDENT_AMBULATORY_CARE_PROVIDER_SITE_OTHER): Payer: No Typology Code available for payment source | Admitting: Family Medicine

## 2019-07-29 ENCOUNTER — Encounter: Payer: Self-pay | Admitting: Family Medicine

## 2019-07-29 DIAGNOSIS — E669 Obesity, unspecified: Secondary | ICD-10-CM

## 2019-07-29 DIAGNOSIS — E559 Vitamin D deficiency, unspecified: Secondary | ICD-10-CM | POA: Diagnosis not present

## 2019-07-29 DIAGNOSIS — M542 Cervicalgia: Secondary | ICD-10-CM

## 2019-07-29 DIAGNOSIS — E785 Hyperlipidemia, unspecified: Secondary | ICD-10-CM | POA: Diagnosis not present

## 2019-07-29 DIAGNOSIS — E538 Deficiency of other specified B group vitamins: Secondary | ICD-10-CM

## 2019-07-29 DIAGNOSIS — I10 Essential (primary) hypertension: Secondary | ICD-10-CM | POA: Diagnosis not present

## 2019-08-02 DIAGNOSIS — M542 Cervicalgia: Secondary | ICD-10-CM | POA: Insufficient documentation

## 2019-08-02 DIAGNOSIS — E669 Obesity, unspecified: Secondary | ICD-10-CM | POA: Insufficient documentation

## 2019-08-02 HISTORY — DX: Obesity, unspecified: E66.9

## 2019-08-02 HISTORY — DX: Cervicalgia: M54.2

## 2019-08-02 NOTE — Progress Notes (Signed)
Virtual Visit via Video Note  I connected with Nicholas Robertson on 07/29/19 at  3:00 PM EDT by a video enabled telemedicine application and verified that I am speaking with the correct person using two identifiers.  Location: Patient: home, patient and provider in visit Provider: home   I discussed the limitations of evaluation and management by telemedicine and the availability of in person appointments. The patient expressed understanding and agreed to proceed. Kem Boroughs, CMA was able to get the patient setup on video visit.    Subjective:    Patient ID: Nicholas Robertson, male    DOB: 03/03/1985, 34 y.o.   MRN: [id number]  Chief Complaint  Patient presents with  . Hypertension    HPI Patient is in today for follow up on chronic medical concerns. No recent febrile illness or hospitalizations. He is doing better on his new blood pressure regimen and his headaches are much better. He attributes it to his blood pressure control and working on his neck pain and stiffness through physical therapy and dry needling. He is trying to eat better and is moving more. Has lost 5 pounds by making better choices and smaller portions. Denies CP/palp/SOB/HA/congestion/fevers/GI or GU c/o. Taking meds as prescribed  Past Medical History:  Diagnosis Date  . Abnormal LFTs    . Deficiency of vitamin B12 09/12/2016  . Fever, unspecified 07/31/2013  . HTN (hypertension)    . Other and unspecified hyperlipidemia    . Preventative health care 04/18/2013  . Strep pharyngitis 10/20/2012  . WPW (Wolff-Parkinson-White syndrome)     s/p ablation 02/2013 by [name]    Past Surgical History:  Procedure Laterality Date  . ABLATION  03/09/2013   EPS and RFCA of manifest right posteroloateral accessory pathway  . SUPRAVENTRICULAR TACHYCARDIA ABLATION N/A 03/08/2013   Procedure: WPW ABLATION;  Surgeon: [name], MD;  Location: [facility] CATH LAB;  Service: Cardiovascular;  Laterality: N/A;    Family History    Problem Relation Age of Onset  . Hypertension Mother   . Hyperlipidemia Mother   . Mental illness Mother        anxiety  . Other Mother        glucose intolerant  . Diabetes Father        2  . Hypertension Father   . Arthritis Father   . Psoriasis Maternal Grandmother   . Hypertension Maternal Grandmother   . Cancer Maternal Grandmother        lymphoma  . Hyperlipidemia Maternal Grandfather   . Heart disease Maternal Grandfather        cabg  . Hypertension Maternal Grandfather   . Polymyalgia rheumatica Maternal Grandfather   . Hypertension Paternal Grandmother   . Diabetes Paternal Grandmother        2  . COPD Paternal Grandmother   . Heart disease Paternal Grandfather        s/p MI, s/p CABG  . Hyperlipidemia Paternal Grandfather   . Hypertension Paternal Grandfather   . Diabetes Paternal Grandfather        1    Social History   Socioeconomic History  . Marital status: Married    Spouse name: Not on file  . Number of children: Not on file  . Years of education: Not on file  . Highest education level: Not on file  Occupational History  . Not on file  Tobacco Use  . Smoking status: Never Smoker  . Smokeless tobacco: Never Used  Vaping Use  .  Vaping Use: Never used  Substance and Sexual Activity  . Alcohol use: No  . Drug use: No  . Sexual activity: Yes    Comment: no dietary restricitons, lives alone with cat, seat belts  Other Topics Concern  . Not on file  Social History Narrative   Works at [employer], as [place] with partner   No major dietary restrictions.    Pet: cat, dog   Social Determinants of Health   Financial Resource Strain:   . Difficulty of Paying Living Expenses:   Food Insecurity:   . Worried About Charity fundraiser in the Last Year:   . Arboriculturist in the Last Year:   Transportation Needs:   . Film/video editor (Medical):   Marland Kitchen Lack of Transportation (Non-Medical):   Physical Activity:   . Days of Exercise per  Week:   . Minutes of Exercise per Session:   Stress:   . Feeling of Stress :   Social Connections:   . Frequency of Communication with Friends and Family:   . Frequency of Social Gatherings with Friends and Family:   . Attends Religious Services:   . Active Member of Clubs or Organizations:   . Attends Archivist Meetings:   Marland Kitchen Marital Status:   Intimate Partner Violence:   . Fear of Current or Ex-Partner:   . Emotionally Abused:   Marland Kitchen Physically Abused:   . Sexually Abused:     Outpatient Medications Prior to Visit  Medication Sig Dispense Refill  . amLODipine (NORVASC) 5 MG tablet Take 1 tablet (5 mg total) by mouth daily. 30 tablet 3  . atorvastatin (LIPITOR) 10 MG tablet Take 1 tablet (10 mg total) by mouth daily. 90 tablet 1  . Cholecalciferol 25 MCG (1000 UT) tablet Take 1,000 Units by mouth daily.    . hydrochlorothiazide (HYDRODIURIL) 25 MG tablet Take 1 tablet (25 mg total) by mouth daily. 90 tablet 1  . losartan (COZAAR) 100 MG tablet Take 1 tablet (100 mg total) by mouth daily. 90 tablet 3  . metoprolol succinate (TOPROL XL) 50 MG 24 hr tablet Take 1 tablet (50 mg total) by mouth daily. Take with or immediately following a meal. 90 tablet 3  . triamcinolone ointment (KENALOG) 0.1 % Apply 1 application topically 2 (two) times daily. 90 g 1  . vortioxetine HBr (TRINTELLIX) 20 MG TABS tablet Take 1 tablet (20 mg total) by mouth daily. 90 tablet 1  . methocarbamol (ROBAXIN) 500 MG tablet Take 1 tablet (500 mg total) by mouth every 8 (eight) hours as needed for muscle spasms. 45 tablet 1  . Vitamin D, Ergocalciferol, (DRISDOL) 1.25 MG (50000 UNIT) CAPS capsule Take 1 capsule (50,000 Units total) by mouth every 7 (seven) days. 12 capsule 1   No facility-administered medications prior to visit.    No Known Allergies  Review of Systems  Constitutional: Negative for fever and malaise/fatigue.  HENT: Negative for congestion.   Eyes: Negative for blurred vision.    Respiratory: Negative for shortness of breath.   Cardiovascular: Negative for chest pain, palpitations and leg swelling.  Gastrointestinal: Negative for abdominal pain, blood in stool and nausea.  Genitourinary: Negative for dysuria and frequency.  Musculoskeletal: Negative for falls.  Skin: Negative for rash.  Neurological: Negative for dizziness, loss of consciousness and headaches.  Endo/Heme/Allergies: Negative for environmental allergies.  Psychiatric/Behavioral: Negative for depression. The patient is nervous/anxious.        Objective:  Physical Exam Constitutional:      Appearance: Normal appearance. He is obese. He is not ill-appearing.  HENT:     Head: Normocephalic and atraumatic.     Right Ear: External ear normal.     Left Ear: External ear normal.     Nose: Nose normal.  Eyes:     General:        Right eye: No discharge.        Left eye: No discharge.  Pulmonary:     Effort: Pulmonary effort is normal.  Neurological:     Mental Status: He is alert and oriented to person, place, and time.  Psychiatric:        Behavior: Behavior normal.     BP 124/74   Pulse 74  Wt Readings from Last 3 Encounters:  06/03/19 255 lb (115.7 kg)  03/04/19 260 lb (117.9 kg)  06/09/18 250 lb (113.4 kg)    Diabetic Foot Exam - Simple   No data filed     Lab Results  Component Value Date   WBC 7.2 06/22/2019   HGB 16.0 06/22/2019   HCT 45.4 06/22/2019   PLT 216.0 06/22/2019   GLUCOSE 103 (H) 06/22/2019   CHOL 181 06/22/2019   TRIG 149.0 06/22/2019   HDL 48.70 06/22/2019   LDLDIRECT 168.0 08/02/2014   LDLCALC 103 (H) 06/22/2019   ALT 106 (H) 06/22/2019   AST 47 (H) 06/22/2019   NA 138 06/22/2019   K 3.6 06/22/2019   CL 100 06/22/2019   CREATININE 0.87 06/22/2019   BUN 14 06/22/2019   CO2 28 06/22/2019   TSH 1.66 06/22/2019   HGBA1C 5.5 08/02/2014    Lab Results  Component Value Date   TSH 1.66 06/22/2019   Lab Results  Component Value Date   WBC 7.2  06/22/2019   HGB 16.0 06/22/2019   HCT 45.4 06/22/2019   MCV 92.6 06/22/2019   PLT 216.0 06/22/2019   Lab Results  Component Value Date   NA 138 06/22/2019   K 3.6 06/22/2019   CO2 28 06/22/2019   GLUCOSE 103 (H) 06/22/2019   BUN 14 06/22/2019   CREATININE 0.87 06/22/2019   BILITOT 0.8 06/22/2019   ALKPHOS 58 06/22/2019   AST 47 (H) 06/22/2019   ALT 106 (H) 06/22/2019   PROT 6.9 06/22/2019   ALBUMIN 4.7 06/22/2019   CALCIUM 9.6 06/22/2019   ANIONGAP 10 03/10/2017   GFR 100.58 06/22/2019   Lab Results  Component Value Date   CHOL 181 06/22/2019   Lab Results  Component Value Date   HDL 48.70 06/22/2019   Lab Results  Component Value Date   LDLCALC 103 (H) 06/22/2019   Lab Results  Component Value Date   TRIG 149.0 06/22/2019   Lab Results  Component Value Date   CHOLHDL 4 06/22/2019   Lab Results  Component Value Date   HGBA1C 5.5 08/02/2014       Assessment & Plan:   Problem List Items Addressed This Visit    HTN (hypertension)    Improved on current regimen. Well controlled, no changes to meds. Encouraged heart healthy diet such as the DASH diet and exercise as tolerated.       Hyperlipidemia    Tolerating statin, encouraged heart healthy diet, avoid trans fats, minimize simple carbs and saturated fats. Increase exercise as tolerated      Vitamin B12 deficiency    Supplement and monitor      Vitamin D deficiency  Supplement and monitor      Neck pain    Improved with PT and dry needling. Continue to stay active and regular stretching      Obesity (BMI 30-39.9)    Encouraged DASH diet, decrease po intake and increase exercise as tolerated. Needs 7-8 hours of sleep nightly. Avoid trans fats, eat small, frequent meals every 4-5 hours with lean proteins, complex carbs and healthy fats. Minimize simple carbs, and continue to minimize portion sizes         I have discontinued Luanna Cole Fludd's Vitamin D (Ergocalciferol) and methocarbamol.  I am also having him maintain his triamcinolone ointment, metoprolol succinate, hydrochlorothiazide, vortioxetine HBr, atorvastatin, Cholecalciferol, amLODipine, and losartan.  No orders of the defined types were placed in this encounter.     I discussed the assessment and treatment plan with the patient. The patient was provided an opportunity to ask questions and all were answered. The patient agreed with the plan and demonstrated an understanding of the instructions.   The patient was advised to call back or seek an in-person evaluation if the symptoms worsen or if the condition fails to improve as anticipated.  I provided 25 minutes of non-face-to-face time during this encounter.   Penni Homans, MD

## 2019-08-02 NOTE — Assessment & Plan Note (Signed)
Encouraged DASH diet, decrease po intake and increase exercise as tolerated. Needs 7-8 hours of sleep nightly. Avoid trans fats, eat small, frequent meals every 4-5 hours with lean proteins, complex carbs and healthy fats. Minimize simple carbs, and continue to minimize portion sizes

## 2019-08-02 NOTE — Assessment & Plan Note (Signed)
Supplement and monitor 

## 2019-08-02 NOTE — Assessment & Plan Note (Signed)
Improved on current regimen. Well controlled, no changes to meds. Encouraged heart healthy diet such as the DASH diet and exercise as tolerated.

## 2019-08-02 NOTE — Assessment & Plan Note (Signed)
Improved with PT and dry needling. Continue to stay active and regular stretching

## 2019-08-02 NOTE — Assessment & Plan Note (Signed)
Tolerating statin, encouraged heart healthy diet, avoid trans fats, minimize simple carbs and saturated fats. Increase exercise as tolerated 

## 2019-08-19 ENCOUNTER — Other Ambulatory Visit: Payer: Self-pay | Admitting: Family Medicine

## 2019-08-19 DIAGNOSIS — N200 Calculus of kidney: Secondary | ICD-10-CM

## 2019-08-19 NOTE — Progress Notes (Signed)
Pt woke w/ pain similar to previous kidney stones.  Needs referral back to Urology for evaluation and tx

## 2019-09-13 ENCOUNTER — Ambulatory Visit (INDEPENDENT_AMBULATORY_CARE_PROVIDER_SITE_OTHER): Payer: No Typology Code available for payment source

## 2019-09-13 ENCOUNTER — Other Ambulatory Visit: Payer: Self-pay

## 2019-09-13 ENCOUNTER — Other Ambulatory Visit: Payer: Self-pay | Admitting: Podiatry

## 2019-09-13 ENCOUNTER — Ambulatory Visit (INDEPENDENT_AMBULATORY_CARE_PROVIDER_SITE_OTHER): Payer: No Typology Code available for payment source | Admitting: Podiatry

## 2019-09-13 DIAGNOSIS — M84374A Stress fracture, right foot, initial encounter for fracture: Secondary | ICD-10-CM | POA: Diagnosis not present

## 2019-09-13 DIAGNOSIS — M8430XA Stress fracture, unspecified site, initial encounter for fracture: Secondary | ICD-10-CM | POA: Diagnosis not present

## 2019-09-13 DIAGNOSIS — M79671 Pain in right foot: Secondary | ICD-10-CM

## 2019-09-13 NOTE — Progress Notes (Signed)
  Subjective:  Patient ID: Nicholas Robertson, male    DOB: [birth year],  MRN: [id number]  Chief Complaint  Patient presents with  . Foot Pain    Rt 4th-5th lateral side x Sat; 8/10 shapr pains, aches - no injury /swelling - pt states," started after doing yard work but no injuyr." - worse with pressure or certain movements -Tx: ace wrap, elevation, resting and aleve    34 y.o. male presents with the above complaint. History confirmed with patient.   Objective:  Physical Exam: warm, good capillary refill, no trophic changes or ulcerative lesions, normal DP and PT pulses and normal sensory exam. Right Foot: POP 4th met neck, 5th met proximal met/diaph junction. Pain with tuning fork at both areas   No images are attached to the encounter.  Radiographs: X-ray of the right foot: Increased trabeculation lesser mets. No definite fractures but with possible early stress fracture metatphyseal diaphyseal junction right 5th met. Assessment:   1. Stress reaction of bone   2. Stress fracture of metatarsal bone of right foot, initial encounter    Plan:  Patient was evaluated and treated and all questions answered.  Stress fracture right 4th/5th Met -XR Reviewed with patient -Would benefit from trial of non-operative management for this injury. -WBAT in CAM Boot and -CAM boot dispensed  Return in about 4 weeks (around 10/11/2019) for Stress fracture right foot.

## 2019-09-18 ENCOUNTER — Encounter: Payer: Self-pay | Admitting: Family Medicine

## 2019-09-20 ENCOUNTER — Other Ambulatory Visit: Payer: Self-pay | Admitting: Family Medicine

## 2019-09-20 MED ORDER — AMLODIPINE BESYLATE 5 MG PO TABS: 5.0000 mg | ORAL_TABLET | Freq: Every day | ORAL | 1 refills | Status: DC

## 2019-09-20 MED ORDER — ATORVASTATIN CALCIUM 10 MG PO TABS: 10.0000 mg | ORAL_TABLET | Freq: Every day | ORAL | 1 refills | Status: DC

## 2019-09-20 MED FILL — AMLODIPINE BESYLATE 5 MG TA: 5 | 90 days supply | Qty: 90 | Fill #0

## 2019-09-20 MED FILL — ATORVASTATIN CALCIUM 10 MG: 10 | 90 days supply | Qty: 90 | Fill #0

## 2019-09-27 ENCOUNTER — Other Ambulatory Visit: Payer: Self-pay | Admitting: Podiatry

## 2019-09-27 DIAGNOSIS — M84374A Stress fracture, right foot, initial encounter for fracture: Secondary | ICD-10-CM

## 2019-09-28 ENCOUNTER — Other Ambulatory Visit: Payer: Self-pay | Admitting: Emergency Medicine

## 2019-09-28 DIAGNOSIS — F32A Depression, unspecified: Secondary | ICD-10-CM

## 2019-09-28 DIAGNOSIS — F419 Anxiety disorder, unspecified: Secondary | ICD-10-CM

## 2019-09-28 DIAGNOSIS — I10 Essential (primary) hypertension: Secondary | ICD-10-CM

## 2019-09-28 MED ORDER — VORTIOXETINE HBR 20 MG PO TABS: 20.0000 mg | ORAL_TABLET | Freq: Every day | ORAL | 1 refills | Status: DC

## 2019-09-28 MED ORDER — HYDROCHLOROTHIAZIDE 25 MG PO TABS: 25.0000 mg | ORAL_TABLET | Freq: Every day | ORAL | 1 refills | Status: DC

## 2019-09-28 MED FILL — HYDROCHLOROTHIAZIDE 25 MG T: 25 | 90 days supply | Qty: 90 | Fill #0

## 2019-09-28 MED FILL — TRINTELLIX 20 MG TABLET: 20 | 90 days supply | Qty: 90 | Fill #0

## 2019-10-13 ENCOUNTER — Other Ambulatory Visit: Payer: Self-pay | Admitting: Emergency Medicine

## 2019-10-13 DIAGNOSIS — I10 Essential (primary) hypertension: Secondary | ICD-10-CM

## 2019-10-13 DIAGNOSIS — I456 Pre-excitation syndrome: Secondary | ICD-10-CM

## 2019-10-13 MED ORDER — LOSARTAN POTASSIUM 100 MG PO TABS: 100.0000 mg | ORAL_TABLET | Freq: Every day | ORAL | 3 refills | Status: DC

## 2019-10-13 MED ORDER — METOPROLOL SUCCINATE ER 50 MG PO TB24: 50.0000 mg | ORAL_TABLET | Freq: Every day | ORAL | 3 refills | Status: DC

## 2019-10-13 MED FILL — METOPROLOL SUCCINATE ER 50: 50 | 90 days supply | Qty: 90 | Fill #0

## 2019-10-13 MED FILL — LOSARTAN POTASSIUM 100 MG T: 100 | 90 days supply | Qty: 90 | Fill #0

## 2019-10-14 ENCOUNTER — Ambulatory Visit: Payer: No Typology Code available for payment source

## 2019-10-14 ENCOUNTER — Other Ambulatory Visit: Payer: Self-pay

## 2019-10-14 ENCOUNTER — Encounter: Payer: Self-pay | Admitting: Podiatry

## 2019-10-14 ENCOUNTER — Other Ambulatory Visit: Payer: Self-pay | Admitting: Podiatry

## 2019-10-14 ENCOUNTER — Ambulatory Visit (INDEPENDENT_AMBULATORY_CARE_PROVIDER_SITE_OTHER): Payer: No Typology Code available for payment source | Admitting: Podiatry

## 2019-10-14 DIAGNOSIS — M84374D Stress fracture, right foot, subsequent encounter for fracture with routine healing: Secondary | ICD-10-CM | POA: Diagnosis not present

## 2019-10-14 DIAGNOSIS — M84374A Stress fracture, right foot, initial encounter for fracture: Secondary | ICD-10-CM

## 2019-10-14 NOTE — Progress Notes (Signed)
  Subjective:  Patient ID: Nicholas Robertson, male    DOB: 04-26-1985,  MRN: [id number]  Chief Complaint  Patient presents with  . Foot Problem    the right foot is doing fine and there was some sorness with the boot due to positioning but that has gone away     34 y.o. male presents with the above complaint. History confirmed with patient.   Objective:  Physical Exam: warm, good capillary refill, no trophic changes or ulcerative lesions, normal DP and PT pulses and normal sensory exam. Right Foot: mild POP 4th met proximally no pain 5th met proximal met/diaph junction.    Assessment:   1. Stress fracture of metatarsal bone of right foot with routine healing, subsequent encounter    Plan:  Patient was evaluated and treated and all questions answered.  Stress fracture right 4th/5th Met -Resolving. Continue normal shoegear -Should pain recur advised to use CAM boot and f/u for repeat XRs.  No follow-ups on file.

## 2019-11-02 ENCOUNTER — Encounter: Payer: Self-pay | Admitting: Family Medicine

## 2019-11-02 ENCOUNTER — Other Ambulatory Visit: Payer: Self-pay | Admitting: Urology

## 2019-11-04 ENCOUNTER — Encounter: Payer: No Typology Code available for payment source | Admitting: Family Medicine

## 2019-11-04 ENCOUNTER — Other Ambulatory Visit (HOSPITAL_COMMUNITY)
Admission: RE | Admit: 2019-11-04 | Discharge: 2019-11-04 | Disposition: A | Discharge status: Home / Self Care | Payer: No Typology Code available for payment source | Source: Ambulatory Visit | Attending: Urology | Admitting: Urology

## 2019-11-04 DIAGNOSIS — Z01818 Encounter for other preprocedural examination: Secondary | ICD-10-CM | POA: Diagnosis not present

## 2019-11-04 DIAGNOSIS — Z20822 Contact with and (suspected) exposure to covid-19: Secondary | ICD-10-CM | POA: Diagnosis not present

## 2019-11-04 LAB — SARS CORONAVIRUS 2 (TAT 6-24 HRS): SARS Coronavirus 2: NEGATIVE

## 2019-11-05 NOTE — Progress Notes (Signed)
Patient called to give instructions for ESWL. No answer. Requested  to return call before 1800.

## 2019-11-05 NOTE — Progress Notes (Signed)
Patient to arrive at [facility] on 11/08/2019. History and medications reviewed. All pre-procedure instructions given. NPO after MN Sunday, morning blood pressure medications with sip of water. Driver secured.

## 2019-11-08 ENCOUNTER — Encounter (HOSPITAL_BASED_OUTPATIENT_CLINIC_OR_DEPARTMENT_OTHER)
Admission: RE | Disposition: A | Discharge status: Home / Self Care | Payer: Self-pay | Source: Home / Self Care | Attending: Urology

## 2019-11-08 ENCOUNTER — Ambulatory Visit (HOSPITAL_COMMUNITY): Payer: No Typology Code available for payment source

## 2019-11-08 ENCOUNTER — Ambulatory Visit (HOSPITAL_BASED_OUTPATIENT_CLINIC_OR_DEPARTMENT_OTHER)
Admission: RE | Admit: 2019-11-08 | Discharge: 2019-11-08 | Disposition: A | Discharge status: Home / Self Care | Payer: No Typology Code available for payment source | Attending: Urology | Admitting: Urology

## 2019-11-08 ENCOUNTER — Other Ambulatory Visit: Payer: Self-pay

## 2019-11-08 ENCOUNTER — Encounter (HOSPITAL_BASED_OUTPATIENT_CLINIC_OR_DEPARTMENT_OTHER): Payer: Self-pay | Admitting: Urology

## 2019-11-08 DIAGNOSIS — E785 Hyperlipidemia, unspecified: Secondary | ICD-10-CM | POA: Diagnosis not present

## 2019-11-08 DIAGNOSIS — N201 Calculus of ureter: Secondary | ICD-10-CM | POA: Insufficient documentation

## 2019-11-08 DIAGNOSIS — Z8249 Family history of ischemic heart disease and other diseases of the circulatory system: Secondary | ICD-10-CM | POA: Diagnosis not present

## 2019-11-08 DIAGNOSIS — I10 Essential (primary) hypertension: Secondary | ICD-10-CM | POA: Insufficient documentation

## 2019-11-08 HISTORY — DX: Family history of other specified conditions: Z84.89

## 2019-11-08 HISTORY — PX: EXTRACORPOREAL SHOCK WAVE LITHOTRIPSY: SHX1557

## 2019-11-08 SURGERY — LITHOTRIPSY, ESWL
Anesthesia: LOCAL | Laterality: Left

## 2019-11-08 MED ORDER — ONDANSETRON 8 MG PO TBDP: 8.0000 mg | ORAL_TABLET | Freq: Three times a day (TID) | ORAL | 0 refills | Status: DC | PRN

## 2019-11-08 MED ORDER — DIPHENHYDRAMINE HCL 25 MG PO CAPS
ORAL_CAPSULE | ORAL | Status: AC
  Filled 2019-11-08: qty 1

## 2019-11-08 MED ORDER — TAMSULOSIN HCL 0.4 MG PO CAPS: 0.4000 mg | ORAL_CAPSULE | Freq: Every day | ORAL | 0 refills | Status: DC

## 2019-11-08 MED ORDER — SODIUM CHLORIDE 0.9 % IV SOLN: INTRAVENOUS | Status: DC

## 2019-11-08 MED ORDER — OXYCODONE-ACETAMINOPHEN 5-325 MG PO TABS: 1.0000 | ORAL_TABLET | Freq: Three times a day (TID) | ORAL | 0 refills | Status: DC | PRN

## 2019-11-08 MED ORDER — DIPHENHYDRAMINE HCL 25 MG PO CAPS
25.0000 mg | ORAL_CAPSULE | ORAL | Status: AC
  Administered 2019-11-08: 25 mg via ORAL

## 2019-11-08 MED ORDER — CIPROFLOXACIN HCL 500 MG PO TABS
500.0000 mg | ORAL_TABLET | ORAL | Status: AC
  Administered 2019-11-08: 500 mg via ORAL

## 2019-11-08 MED ORDER — CIPROFLOXACIN HCL 500 MG PO TABS
ORAL_TABLET | ORAL | Status: AC
  Filled 2019-11-08: qty 1

## 2019-11-08 MED ORDER — DIAZEPAM 5 MG PO TABS
ORAL_TABLET | ORAL | Status: AC
  Filled 2019-11-08: qty 2

## 2019-11-08 MED ORDER — DIAZEPAM 5 MG PO TABS
10.0000 mg | ORAL_TABLET | ORAL | Status: AC
  Administered 2019-11-08: 10 mg via ORAL

## 2019-11-08 NOTE — H&P (Signed)
Nicholas Robertson is an 34 y.o. male.    Chief Complaint: Pre-Op LEFT shockwave lithotripsy  HPI:   1- LEFT Distal Ureteral Stone  - 3-85mm left distal stone by CT and KUB 09/2019. No passage with few weeks medical therapy. UCX negatice. C19 screen negative.  Today "Nicholas Robertson"  is seen to proceed with LEFT shockwave lithotripsy for small distal stone that has failed medical therapy. No interval fevers.   Past Medical History:  Diagnosis Date  . Abnormal LFTs    . Deficiency of vitamin B12 09/12/2016  . Fever, unspecified 07/31/2013  . HTN (hypertension)    . Other and unspecified hyperlipidemia    . Preventative health care 04/18/2013  . Strep pharyngitis 10/20/2012  . WPW (Wolff-Parkinson-White syndrome)     s/p ablation 02/2013 by [name]    Past Surgical History:  Procedure Laterality Date  . ABLATION  03/09/2013   EPS and RFCA of manifest right posteroloateral accessory pathway  . SUPRAVENTRICULAR TACHYCARDIA ABLATION N/A 03/08/2013   Procedure: WPW ABLATION;  Surgeon: [name], MD;  Location: [facility] CATH LAB;  Service: Cardiovascular;  Laterality: N/A;    Family History  Problem Relation Age of Onset  . Hypertension Mother   . Hyperlipidemia Mother   . Mental illness Mother        anxiety  . Other Mother        glucose intolerant  . Diabetes Father        2  . Hypertension Father   . Arthritis Father   . Psoriasis Maternal Grandmother   . Hypertension Maternal Grandmother   . Cancer Maternal Grandmother        lymphoma  . Hyperlipidemia Maternal Grandfather   . Heart disease Maternal Grandfather        cabg  . Hypertension Maternal Grandfather   . Polymyalgia rheumatica Maternal Grandfather   . Hypertension Paternal Grandmother   . Diabetes Paternal Grandmother        2  . COPD Paternal Grandmother   . Heart disease Paternal Grandfather        s/p MI, s/p CABG  . Hyperlipidemia Paternal Grandfather   . Hypertension Paternal Grandfather   . Diabetes Paternal  Grandfather        1   Social History:  reports that he has never smoked. He has never used smokeless tobacco. He reports that he does not drink alcohol and does not use drugs.  Allergies: No Known Allergies  No medications prior to admission.    No results found for this or any previous visit (from the past 48 hour(s)). No results found.  Review of Systems  Constitutional: Negative for chills and fever.  Genitourinary: Positive for flank pain and urgency.    There were no vitals taken for this visit. Physical Exam Vitals reviewed.  HENT:     Head: Normocephalic.     Nose: Nose normal.     Mouth/Throat:     Mouth: Mucous membranes are moist.  Cardiovascular:     Rate and Rhythm: Normal rate.  Pulmonary:     Effort: Pulmonary effort is normal.  Abdominal:     General: Abdomen is flat.  Genitourinary:    Comments: Minimal left CVAT at present.  Musculoskeletal:        General: Normal range of motion.     Cervical back: Normal range of motion.  Skin:    General: Skin is warm.     Capillary Refill: Capillary refill takes less than  2 seconds.  Neurological:     General: No focal deficit present.     Mental Status: He is alert.      Assessment/Plan  Proceed as planned with LEFT shockwave lithotripsy. Risks, benefits, alternatives (including continued medical therapy), expected peri-op course discussed.  [name], MD 11/08/2019, 5:29 AM

## 2019-11-08 NOTE — Brief Op Note (Signed)
11/08/2019  9:54 AM  PATIENT:  [name]  34 y.o. male  PRE-OPERATIVE DIAGNOSIS:  LEFT URETERAL CALCULI  POST-OPERATIVE DIAGNOSIS:  * No post-op diagnosis entered *  PROCEDURE:  Procedure(s): EXTRACORPOREAL SHOCK WAVE LITHOTRIPSY (ESWL) (Left)  SURGEON:  Surgeon(s) and Role:    [name], MD - Primary  PHYSICIAN ASSISTANT:   ASSISTANTS: none   ANESTHESIA:   MAC  EBL:  minimal   BLOOD ADMINISTERED:none  DRAINS: none   LOCAL MEDICATIONS USED:  NONE  SPECIMEN:  No Specimen  DISPOSITION OF SPECIMEN:  N/A  COUNTS:  YES  TOURNIQUET:  * No tourniquets in log *  DICTATION: .Note written in paper chart  PLAN OF CARE: Discharge to home after PACU  PATIENT DISPOSITION:  Short Stay   Delay start of Pharmacological VTE agent (>24hrs) due to surgical blood loss or risk of bleeding: not applicable

## 2019-11-08 NOTE — Discharge Instructions (Signed)
1 - You may have urinary urgency (bladder spasms), pass small stone fragments, and bloody urine on / off for up to 2 weeks. This is normal.  2 - Call MD or go to ER for fever >102, severe pain / nausea / vomiting not relieved by medications, or acute change in medical status     Lithotripsy, Care After This sheet gives you information about how to care for yourself after your procedure. Your health care provider may also give you more specific instructions. If you have problems or questions, contact your health care provider. What can I expect after the procedure? After the procedure, it is common to have:  Some blood in your urine. This should only last for a few days.  Soreness in your back, sides, or upper abdomen for a few days.  Blotches or bruises on your back where the pressure wave entered the skin.  Pain, discomfort, or nausea when pieces (fragments) of the kidney stone move through the tube that carries urine from the kidney to the bladder (ureter). Stone fragments may pass soon after the procedure, but they may continue to pass for up to 4-8 weeks. ? If you have severe pain or nausea, contact your health care provider. This may be caused by a large stone that was not broken up, and this may mean that you need more treatment.  Some pain or discomfort during urination.  Some pain or discomfort in the lower abdomen or (in men) at the base of the penis. Follow these instructions at home: Medicines  Take over-the-counter and prescription medicines only as told by your health care provider.  If you were prescribed an antibiotic medicine, take it as told by your health care provider. Do not stop taking the antibiotic even if you start to feel better.  Do not drive for 24 hours if you were given a medicine to help you relax (sedative).  Do not drive or use heavy machinery while taking prescription pain medicine. Eating and drinking      Drink enough water and fluids to keep  your urine clear or pale yellow. This helps any remaining pieces of the stone to pass. It can also help prevent new stones from forming.  Eat plenty of fresh fruits and vegetables.  Follow instructions from your health care provider about eating and drinking restrictions. You may be instructed: ? To reduce how much salt (sodium) you eat or drink. Check ingredients and nutrition facts on packaged foods and beverages. ? To reduce how much meat you eat.  Eat the recommended amount of calcium for your age and gender. Ask your health care provider how much calcium you should have. General instructions  Get plenty of rest.  Most people can resume normal activities 1-2 days after the procedure. Ask your health care provider what activities are safe for you.  Your health care provider may direct you to lie in a certain position (postural drainage) and tap firmly (percuss) over your kidney area to help stone fragments pass. Follow instructions as told by your health care provider.  If directed, strain all urine through the strainer that was provided by your health care provider. ? Keep all fragments for your health care provider to see. Any stones that are found may be sent to a medical lab for examination. The stone may be as small as a grain of salt.  Keep all follow-up visits as told by your health care provider. This is important. Contact a health care provider if:  You have pain that is severe or does not get better with medicine.  You have nausea that is severe or does not go away.  You have blood in your urine longer than your health care provider told you to expect.  You have more blood in your urine.  You have pain during urination that does not go away.  You urinate more frequently than usual and this does not go away.  You develop a rash or any other possible signs of an allergic reaction. Get help right away if:  You have severe pain in your back, sides, or upper  abdomen.  You have severe pain while urinating.  Your urine is very dark red.  You have blood in your stool (feces).  You cannot pass any urine at all.  You feel a strong urge to urinate after emptying your bladder.  You have a fever or chills.  You develop shortness of breath, difficulty breathing, or chest pain.  You have severe nausea that leads to persistent vomiting.  You faint. Summary  After this procedure, it is common to have some pain, discomfort, or nausea when pieces (fragments) of the kidney stone move through the tube that carries urine from the kidney to the bladder (ureter). If this pain or nausea is severe, however, you should contact your health care provider.  Most people can resume normal activities 1-2 days after the procedure. Ask your health care provider what activities are safe for you.  Drink enough water and fluids to keep your urine clear or pale yellow. This helps any remaining pieces of the stone to pass, and it can help prevent new stones from forming.  If directed, strain your urine and keep all fragments for your health care provider to see. Fragments or stones may be as small as a grain of salt.  Get help right away if you have severe pain in your back, sides, or upper abdomen or have severe pain while urinating. This information is not intended to replace advice given to you by your health care provider. Make sure you discuss any questions you have with your health care provider. Document Revised: 04/27/2018 Document Reviewed: 12/06/2015 Elsevier Patient Education  2020 [employer].

## 2019-11-09 ENCOUNTER — Encounter (HOSPITAL_BASED_OUTPATIENT_CLINIC_OR_DEPARTMENT_OTHER): Payer: Self-pay | Admitting: Urology

## 2019-11-15 ENCOUNTER — Telehealth: Payer: Self-pay | Admitting: Emergency Medicine

## 2019-11-15 MED ORDER — PHENAZOPYRIDINE HCL 200 MG PO TABS: 200.0000 mg | ORAL_TABLET | Freq: Three times a day (TID) | ORAL | 0 refills | Status: DC | PRN

## 2019-11-15 NOTE — Addendum Note (Signed)
Addended by: Midge Minium on: 11/15/2019 11:16 AM   Modules accepted: Orders

## 2019-11-15 NOTE — Telephone Encounter (Signed)
Patient is still having bad spasms of the bladder after his lithotripsy procedure. He is requesting a rx for pyridium to help with spasms

## 2019-11-15 NOTE — Telephone Encounter (Signed)
[facility] in [facility]

## 2019-11-15 NOTE — Telephone Encounter (Signed)
Prescription sent to pharmacy per pt request. 

## 2019-11-16 ENCOUNTER — Other Ambulatory Visit (HOSPITAL_COMMUNITY): Payer: Self-pay | Admitting: Urology

## 2019-11-16 MED FILL — TAMSULOSIN HCL 0.4 MG CAP: 0.4 | 30 days supply | Qty: 30 | Fill #0

## 2019-11-26 ENCOUNTER — Encounter (HOSPITAL_BASED_OUTPATIENT_CLINIC_OR_DEPARTMENT_OTHER): Payer: Self-pay | Admitting: Urology

## 2019-12-20 MED FILL — ATORVASTATIN CALCIUM 10 MG: 10 | 90 days supply | Qty: 90 | Fill #1

## 2019-12-20 MED FILL — AMLODIPINE BESYLATE 5 MG TA: 5 | 90 days supply | Qty: 90 | Fill #1

## 2019-12-29 ENCOUNTER — Other Ambulatory Visit: Payer: Self-pay | Admitting: Emergency Medicine

## 2019-12-29 DIAGNOSIS — F419 Anxiety disorder, unspecified: Secondary | ICD-10-CM

## 2019-12-29 DIAGNOSIS — F32A Depression, unspecified: Secondary | ICD-10-CM

## 2019-12-29 MED ORDER — VORTIOXETINE HBR 20 MG PO TABS: 20.0000 mg | ORAL_TABLET | Freq: Every day | ORAL | 1 refills | Status: DC

## 2019-12-29 MED FILL — TRINTELLIX 20 MG TABLET: 20 | 90 days supply | Qty: 90 | Fill #0

## 2020-01-06 MED FILL — METOPROLOL SUCCINATE ER 50: 50 | 90 days supply | Qty: 90 | Fill #1

## 2020-01-06 MED FILL — HYDROCHLOROTHIAZIDE 25 MG T: 25 | 90 days supply | Qty: 90 | Fill #1

## 2020-01-06 MED FILL — LOSARTAN POTASSIUM 100 MG T: 100 | 90 days supply | Qty: 90 | Fill #1

## 2020-03-13 ENCOUNTER — Other Ambulatory Visit: Payer: Self-pay | Admitting: Family Medicine

## 2020-03-13 MED FILL — AMLODIPINE BESYLATE 5 MG TA: 5 | 90 days supply | Qty: 90 | Fill #0

## 2020-03-28 ENCOUNTER — Other Ambulatory Visit: Payer: Self-pay

## 2020-03-28 ENCOUNTER — Encounter: Payer: Self-pay | Admitting: Family Medicine

## 2020-03-28 ENCOUNTER — Ambulatory Visit (INDEPENDENT_AMBULATORY_CARE_PROVIDER_SITE_OTHER): Payer: No Typology Code available for payment source | Admitting: Family Medicine

## 2020-03-28 ENCOUNTER — Other Ambulatory Visit: Payer: Self-pay | Admitting: Family Medicine

## 2020-03-28 VITALS — BP 160/100 | HR 73 | Temp 98.8°F | Resp 16 | Ht 70.0 in | Wt 260.0 lb

## 2020-03-28 DIAGNOSIS — I10 Essential (primary) hypertension: Secondary | ICD-10-CM | POA: Diagnosis not present

## 2020-03-28 DIAGNOSIS — R0789 Other chest pain: Secondary | ICD-10-CM | POA: Diagnosis not present

## 2020-03-28 LAB — CBC WITH DIFFERENTIAL/PLATELET
Basophils Absolute: 0 10*3/uL (ref 0.0–0.1)
Basophils Relative: 0.4 % (ref 0.0–3.0)
Eosinophils Absolute: 0.1 10*3/uL (ref 0.0–0.7)
Eosinophils Relative: 1.4 % (ref 0.0–5.0)
HCT: 45.6 % (ref 39.0–52.0)
Hemoglobin: 16.2 g/dL (ref 13.0–17.0)
Lymphocytes Relative: 38.6 % (ref 12.0–46.0)
Lymphs Abs: 2.9 10*3/uL (ref 0.7–4.0)
MCHC: 35.4 g/dL (ref 30.0–36.0)
MCV: 91.2 fl (ref 78.0–100.0)
Monocytes Absolute: 0.7 10*3/uL (ref 0.1–1.0)
Monocytes Relative: 9.4 % (ref 3.0–12.0)
Neutro Abs: 3.7 10*3/uL (ref 1.4–7.7)
Neutrophils Relative %: 50.2 % (ref 43.0–77.0)
Platelets: 211 10*3/uL (ref 150.0–400.0)
RBC: 5 Mil/uL (ref 4.22–5.81)
RDW: 12.9 % (ref 11.5–15.5)
WBC: 7.4 10*3/uL (ref 4.0–10.5)

## 2020-03-28 LAB — HEPATIC FUNCTION PANEL
ALT: 156 U/L — ABNORMAL HIGH (ref 0–53)
AST: 79 U/L — ABNORMAL HIGH (ref 0–37)
Albumin: 4.5 g/dL (ref 3.5–5.2)
Alkaline Phosphatase: 62 U/L (ref 39–117)
Bilirubin, Direct: 0.1 mg/dL (ref 0.0–0.3)
Total Bilirubin: 0.8 mg/dL (ref 0.2–1.2)
Total Protein: 7.3 g/dL (ref 6.0–8.3)

## 2020-03-28 LAB — LIPID PANEL
Cholesterol: 198 mg/dL (ref 0–200)
HDL: 45.6 mg/dL (ref >39.00)
LDL Cholesterol: 114 mg/dL — ABNORMAL HIGH (ref 0–99)
NonHDL: 152.06
Total CHOL/HDL Ratio: 4
Triglycerides: 188 mg/dL — ABNORMAL HIGH (ref 0.0–149.0)
VLDL: 37.6 mg/dL (ref 0.0–40.0)

## 2020-03-28 LAB — BASIC METABOLIC PANEL
BUN: 10 mg/dL (ref 6–23)
CO2: 27 mEq/L (ref 19–32)
Calcium: 9.8 mg/dL (ref 8.4–10.5)
Chloride: 101 mEq/L (ref 96–112)
Creatinine, Ser: 0.78 mg/dL (ref 0.40–1.50)
GFR: 116.31 mL/min (ref >60.00)
Glucose, Bld: 117 mg/dL — ABNORMAL HIGH (ref 70–99)
Potassium: 3.4 mEq/L — ABNORMAL LOW (ref 3.5–5.1)
Sodium: 137 mEq/L (ref 135–145)

## 2020-03-28 LAB — TSH: TSH: 2.7 u[IU]/mL (ref 0.35–4.50)

## 2020-03-28 MED ORDER — VALSARTAN-HYDROCHLOROTHIAZIDE 320-12.5 MG PO TABS: 1.0000 | ORAL_TABLET | Freq: Every day | ORAL | 3 refills | Status: DC

## 2020-03-28 MED ORDER — FAMOTIDINE 40 MG PO TABS: 40.0000 mg | ORAL_TABLET | Freq: Every day | ORAL | 1 refills | Status: DC

## 2020-03-28 MED FILL — FAMOTIDINE 40 MG TABS: 40 | 90 days supply | Qty: 90 | Fill #0

## 2020-03-28 MED FILL — VALSARTAN-HCTZ 320-12.5 MG: 320-12.5 | 30 days supply | Qty: 30 | Fill #0

## 2020-03-28 NOTE — Patient Instructions (Addendum)
Follow up in 1 month to recheck BP We'll notify you of your lab results and make any changes if needed STOP the Losartan and HCTZ START the Valsartan HCTZ combo med CONTINUE the Amlodipine daily START Famotidine daily to decrease reflux/gastritis Continue working on healthy diet and regular exercise Call with any questions or concerns Hang in there!

## 2020-03-28 NOTE — Progress Notes (Signed)
Subjective:    Patient ID: Nicholas Robertson, male    DOB: 06-01-1985, 35 y.o.   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  L sided.  Initially thought it was GI related but it's becoming more frequent.  Not exertional.  Can radiate to back.  At times will develop chest tightness w/ change in temp/humidity but no hx of asthma.  At times will feel heart is 'pounding'.  Denies SOB.  Pt does note some reflux recently.  Has taken OTC Nexium prn which helps w/ reflux sxs but not the chest pain issue.  Pain is not stress related.  No TTP over chest wall.  Occasionally mild LUQ TTP.    HTN- pt reports he had been checking this at home and it was 'fine' so he stopped checking.  Typically will become symptomatic w/ elevated BP- HAs, flushing, etc- but has not had any sxs recently.   Review of Systems For ROS see HPI   This visit occurred during the SARS-CoV-2 public health emergency.  Safety protocols were in place, including screening questions prior to the visit, additional usage of staff PPE, and extensive cleaning of exam room while observing appropriate contact time as indicated for disinfecting solutions.       Objective:   Physical Exam Vitals reviewed.  Constitutional:      General: He is not in acute distress.    Appearance: He is well-developed. He is not ill-appearing.  HENT:     Head: Normocephalic and atraumatic.  Eyes:     Conjunctiva/sclera: Conjunctivae normal.     Pupils: Pupils are equal, round, and reactive to light.  Neck:     Thyroid: No thyromegaly.  Cardiovascular:     Rate and Rhythm: Normal rate and regular rhythm.     Pulses:          Radial pulses are 2+ on the right side and 2+ on the left side.       Dorsalis pedis pulses are 2+ on the right side and 2+ on the left side.       Posterior tibial pulses are 2+ on the right side and 2+ on the left side.     Heart sounds: Normal heart sounds. No murmur heard.   Pulmonary:     Effort: Pulmonary  effort is normal. No respiratory distress.     Breath sounds: Normal breath sounds.  Chest:     Chest wall: No deformity or tenderness.  Abdominal:     General: Bowel sounds are normal. There is no distension.     Palpations: Abdomen is soft.  Musculoskeletal:     Cervical back: Normal range of motion and neck supple.     Right lower leg: No edema.     Left lower leg: No edema.  Lymphadenopathy:     Cervical: No cervical adenopathy.  Skin:    General: Skin is warm and dry.  Neurological:     Mental Status: He is alert and oriented to person, place, and time.     Cranial Nerves: No cranial nerve deficit.  Psychiatric:        Behavior: Behavior normal.           Assessment & Plan:  Atypical CP- new.  CP is very atypical as it doesn't occur w/ physical exertion, can change w/ temperature and humidity, and has some GI qualities.  EKG WNL.  Will check labs to risk stratify.  Encouraged him to continue acid reducer-  will send H2 blocker.  Pt is to log sxs and watch for any particular triggers.  If no improvement, will refer back to Cards.  Pt expressed understanding and is in agreement w/ plan.

## 2020-03-29 ENCOUNTER — Other Ambulatory Visit (INDEPENDENT_AMBULATORY_CARE_PROVIDER_SITE_OTHER): Payer: No Typology Code available for payment source

## 2020-03-29 DIAGNOSIS — R7309 Other abnormal glucose: Secondary | ICD-10-CM | POA: Diagnosis not present

## 2020-03-29 LAB — HEMOGLOBIN A1C: Hgb A1c MFr Bld: 6.4 % (ref 4.6–6.5)

## 2020-03-29 MED FILL — TRINTELLIX 20 MG TABLET: 20 | 90 days supply | Qty: 90 | Fill #1

## 2020-04-03 ENCOUNTER — Other Ambulatory Visit: Payer: Self-pay | Admitting: Family Medicine

## 2020-04-03 ENCOUNTER — Encounter: Payer: Self-pay | Admitting: Family Medicine

## 2020-04-03 MED ORDER — ATORVASTATIN CALCIUM 10 MG PO TABS
10.0000 mg | ORAL_TABLET | Freq: Every day | ORAL | 3 refills | Status: DC
Start: 2020-04-03 — End: 2020-04-03

## 2020-04-03 MED FILL — ATORVASTATIN 10 MG TABLET: 10 | 90 days supply | Qty: 90 | Fill #0

## 2020-04-03 MED FILL — METOPROLOL SUCCINATE ER 50: 50 | 90 days supply | Qty: 90 | Fill #2

## 2020-04-11 ENCOUNTER — Encounter: Payer: Self-pay | Admitting: Family Medicine

## 2020-04-20 ENCOUNTER — Encounter: Payer: Self-pay | Admitting: Family Medicine

## 2020-04-20 ENCOUNTER — Ambulatory Visit (INDEPENDENT_AMBULATORY_CARE_PROVIDER_SITE_OTHER): Payer: No Typology Code available for payment source | Admitting: Family Medicine

## 2020-04-20 ENCOUNTER — Other Ambulatory Visit: Payer: Self-pay | Admitting: Family Medicine

## 2020-04-20 VITALS — BP 130/88 | HR 70 | Temp 98.1°F | Resp 17 | Ht 70.0 in | Wt 259.0 lb

## 2020-04-20 DIAGNOSIS — K219 Gastro-esophageal reflux disease without esophagitis: Secondary | ICD-10-CM

## 2020-04-20 DIAGNOSIS — R0789 Other chest pain: Secondary | ICD-10-CM

## 2020-04-20 DIAGNOSIS — I10 Essential (primary) hypertension: Secondary | ICD-10-CM | POA: Diagnosis not present

## 2020-04-20 HISTORY — DX: Gastro-esophageal reflux disease without esophagitis: K21.9

## 2020-04-20 LAB — BASIC METABOLIC PANEL
BUN: 11 mg/dL (ref 6–23)
CO2: 27 mEq/L (ref 19–32)
Calcium: 9.7 mg/dL (ref 8.4–10.5)
Chloride: 103 mEq/L (ref 96–112)
Creatinine, Ser: 0.98 mg/dL (ref 0.40–1.50)
GFR: 100.53 mL/min (ref >60.00)
Glucose, Bld: 124 mg/dL — ABNORMAL HIGH (ref 70–99)
Potassium: 3.7 mEq/L (ref 3.5–5.1)
Sodium: 139 mEq/L (ref 135–145)

## 2020-04-20 MED ORDER — PANTOPRAZOLE SODIUM 40 MG PO TBEC: 40.0000 mg | DELAYED_RELEASE_TABLET | Freq: Every day | ORAL | 3 refills | Status: DC

## 2020-04-20 MED FILL — PANTOPRAZOLE SOD DR 40 MG T: 40 | 30 days supply | Qty: 30 | Fill #0

## 2020-04-20 NOTE — Assessment & Plan Note (Signed)
Deteriorated.  BP is elevated today despite pt taking his medication this morning.  Will stop the Losartan and HCTZ and start higher potency Valsartan HCTZ 320/12.5mg  daily.  He is to continue his Amlodipine 5mg  daily and Metoprolol 50mg  daily.  He is to work on low salt intake, increased water intake, and regular exercise.  Will follow closely.

## 2020-04-20 NOTE — Progress Notes (Signed)
   Subjective:    Patient ID: Nicholas Robertson, male    DOB: 29-Oct-1985, 35 y.o.   MRN: [id number]  HPI HTN- ongoing issue for pt.  At last visit Losartan was switched to Valsartan 320mg  daily and HCTZ was decreased to 12.5mg  daily.  BP is much better today.  Also on Amlodipine 5mg  daily and Metoprolol XL 50mg  daily.  CP- continues to have pain across center of chest.  Not exertional.  Can feel reflux when drinking water.  On OTC Nexium and Pepcid w/o resolution of sxs.  Pt can feel reflux up into throat.  Constantly clearing throat.   Review of Systems For ROS see HPI   This visit occurred during the SARS-CoV-2 public health emergency.  Safety protocols were in place, including screening questions prior to the visit, additional usage of staff PPE, and extensive cleaning of exam room while observing appropriate contact time as indicated for disinfecting solutions.       Objective:   Physical Exam Vitals reviewed.  Constitutional:      General: He is not in acute distress.    Appearance: Normal appearance. He is well-developed. He is obese. He is not ill-appearing.  HENT:     Head: Normocephalic and atraumatic.  Eyes:     Conjunctiva/sclera: Conjunctivae normal.     Pupils: Pupils are equal, round, and reactive to light.  Neck:     Thyroid: No thyromegaly.  Cardiovascular:     Rate and Rhythm: Normal rate and regular rhythm.     Pulses: Normal pulses.     Heart sounds: Normal heart sounds. No murmur heard.   Pulmonary:     Effort: Pulmonary effort is normal. No respiratory distress.     Breath sounds: Normal breath sounds.  Abdominal:     General: Bowel sounds are normal. There is no distension.     Palpations: Abdomen is soft.  Musculoskeletal:     Cervical back: Normal range of motion and neck supple.     Right lower leg: No edema.     Left lower leg: No edema.  Lymphadenopathy:     Cervical: No cervical adenopathy.  Skin:    General: Skin is warm and dry.   Neurological:     Mental Status: He is alert and oriented to person, place, and time.     Cranial Nerves: No cranial nerve deficit.  Psychiatric:        Behavior: Behavior normal.           Assessment & Plan:

## 2020-04-20 NOTE — Patient Instructions (Addendum)
Follow up in 6 months to recheck BP, cholesterol, and sugar We'll notify you of your lab results and make any changes if needed Continue the Valsartan/HCTZ daily- BP is MUCH better We'll call you with your GI appt START the Protonix daily in addition to the Pepcid Call with any questions or concerns [place]!!!

## 2020-04-21 ENCOUNTER — Other Ambulatory Visit (HOSPITAL_BASED_OUTPATIENT_CLINIC_OR_DEPARTMENT_OTHER): Payer: Self-pay

## 2020-04-25 ENCOUNTER — Encounter: Payer: Self-pay | Admitting: Gastroenterology

## 2020-04-26 NOTE — Assessment & Plan Note (Signed)
Deteriorated.  Pt continues to have pain across center of chest and can feel reflux.  Will add prescription strength PPI to his Famotidine.  Will also refer to GI for complete evaluation and tx.  Pt expressed understanding and is in agreement w/ plan.

## 2020-04-26 NOTE — Assessment & Plan Note (Signed)
Much better control w/ the change from Losartan to Valsartan.  Will check labs but plan is to continue the Valsartan HCTZ 320mg /12.5mg  daily, Metoprolol XL 50mg , and Amlodipine 5mg  daily.

## 2020-04-27 MED FILL — VALSARTAN-HCTZ 320-12.5 MG: 320-12.5 | 30 days supply | Qty: 30 | Fill #1

## 2020-05-03 ENCOUNTER — Ambulatory Visit: Payer: No Typology Code available for payment source | Admitting: Family Medicine

## 2020-05-11 ENCOUNTER — Other Ambulatory Visit (HOSPITAL_COMMUNITY): Payer: Self-pay

## 2020-05-11 ENCOUNTER — Encounter: Payer: Self-pay | Admitting: Family Medicine

## 2020-05-11 MED ORDER — OLMESARTAN MEDOXOMIL-HCTZ 40-12.5 MG PO TABS
1.0000 | ORAL_TABLET | Freq: Every day | ORAL | 3 refills | Status: DC
  Filled 2020-05-11: qty 30, 30d supply, fill #0
  Filled 2020-06-19: qty 30, 30d supply, fill #1
  Filled 2020-07-11: qty 30, 30d supply, fill #2
  Filled 2020-08-10: qty 30, 30d supply, fill #3

## 2020-05-12 ENCOUNTER — Other Ambulatory Visit (HOSPITAL_COMMUNITY): Payer: Self-pay

## 2020-05-16 ENCOUNTER — Other Ambulatory Visit (HOSPITAL_COMMUNITY): Payer: Self-pay

## 2020-05-19 ENCOUNTER — Ambulatory Visit (INDEPENDENT_AMBULATORY_CARE_PROVIDER_SITE_OTHER): Payer: No Typology Code available for payment source | Admitting: Gastroenterology

## 2020-05-19 ENCOUNTER — Encounter: Payer: Self-pay | Admitting: Gastroenterology

## 2020-05-19 ENCOUNTER — Other Ambulatory Visit: Payer: Self-pay

## 2020-05-19 VITALS — BP 134/78 | HR 106 | Ht 70.0 in | Wt 258.4 lb

## 2020-05-19 DIAGNOSIS — K760 Fatty (change of) liver, not elsewhere classified: Secondary | ICD-10-CM

## 2020-05-19 DIAGNOSIS — Z6837 Body mass index (BMI) 37.0-37.9, adult: Secondary | ICD-10-CM

## 2020-05-19 DIAGNOSIS — K219 Gastro-esophageal reflux disease without esophagitis: Secondary | ICD-10-CM | POA: Diagnosis not present

## 2020-05-19 DIAGNOSIS — R7401 Elevation of levels of liver transaminase levels: Secondary | ICD-10-CM

## 2020-05-19 DIAGNOSIS — R1310 Dysphagia, unspecified: Secondary | ICD-10-CM

## 2020-05-19 NOTE — Progress Notes (Signed)
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  He reports a history of reflux symptoms over the last year or so, characterized by HB, regurgitation, and more recently with intermittent LUQ pain. Worse with carbonated beverages, chocolate.  Previously treated with OTC Nexium and Pepcid without significant improvement.  Recently seen by his PCM on 04/20/2020 and started on Protonix 40 mg/day with continued Pepcid 40 mg/day which has helped sxs, but still with intermittent regurgitation. Also with intermittent solid food dysphagia for several months, occurring a few times/month. Can occur with pills too.   Has had slow progressive 60# wt gain over last 7 years or so, but started dieting recently and 8# intentional loss.   Reviewed labs from 03/2020: Normal CBC, AST/ALT 79/156, normal BMP (BG 117), normal TSH.  A1c 6.4%  Hepatic Function Latest Ref Rng & Units 03/28/2020 06/22/2019 03/04/2019  Total Protein 6.0 - 8.3 g/dL 7.3 6.9 7.1  Albumin 3.5 - 5.2 g/dL 4.5 4.7 4.6  AST 0 - 37 U/L 79(H) 47(H) 73(H)  ALT 0 - 53 U/L 156(H) 106(H) 167(H)  Alk Phosphatase 39 - 117 U/L 62 58 52  Total Bilirubin 0.2 - 1.2 mg/dL 0.8 0.8 0.9  Bilirubin, Direct 0.0 - 0.3 mg/dL 0.1 - -   -RUQ [place] (03/13/2019): 1.3 x 1.3 x 1.3 cm hypoechoic mass in the right hepatic lobe, increased echogenicity c/w steatosis - MRI liver (03/20/2019): Diffuse hepatic steatosis, 1.0 x 0.8 cm hemangioma.  Normal GB no duct dilation, normal pancreas.  Does not drink alcohol. No other known history of liver/biliary disease.    Past Medical History:  Diagnosis Date  . Abnormal LFTs    . Anal fissure   . Deficiency of vitamin B12 09/12/2016  . Family history  of adverse reaction to anesthesia    father is hard to wake after anesthesia  . Fever, unspecified 07/31/2013  . HTN (hypertension)    . Other and unspecified hyperlipidemia    . Preventative health care 04/18/2013  . Strep pharyngitis 10/20/2012  . WPW (Wolff-Parkinson-White syndrome)     s/p ablation 02/2013 by [name]     Past Surgical History:  Procedure Laterality Date  . ABLATION  03/09/2013   EPS and RFCA of manifest right posteroloateral accessory pathway  . EXTRACORPOREAL SHOCK WAVE LITHOTRIPSY Left 11/08/2019   Procedure: EXTRACORPOREAL SHOCK WAVE LITHOTRIPSY (ESWL);  Surgeon: [name], MD;  Location: [facility];  Service: Urology;  Laterality: Left;  . [place]   [facility] [place] N/A 03/08/2013   Procedure: WPW ABLATION;  Surgeon: [name], MD;  Location: [facility] CATH LAB;  Service: Cardiovascular;  Laterality: N/A;   Family History  Problem Relation Age of Onset  . Hypertension Mother   . Hyperlipidemia Mother   . Mental illness Mother        anxiety  . Other Mother        glucose intolerant  . Colon polyps Mother   . Diabetes Father        2  . Hypertension Father   . Arthritis  Father   . Colon polyps Father   . Psoriasis Maternal Grandmother   . Hypertension Maternal Grandmother   . Cancer Maternal Grandmother        lymphoma  . Hyperlipidemia Maternal Grandfather   . Heart disease Maternal Grandfather        cabg  . Hypertension Maternal Grandfather   . Polymyalgia rheumatica Maternal Grandfather   . Hypertension Paternal Grandmother   . Diabetes Paternal Grandmother        2  . COPD Paternal Grandmother   . Heart disease Paternal Grandfather        s/p MI, s/p CABG  . Hyperlipidemia Paternal Grandfather   . Hypertension Paternal Grandfather   . Diabetes Paternal Grandfather        1  . Colon cancer Neg Hx   . Esophageal cancer Neg Hx     Social History   Tobacco Use  . Smoking status: Never Smoker  . Smokeless tobacco: Never Used  Vaping Use  . Vaping Use: Never used  Substance Use Topics  . Alcohol use: No  . Drug use: No   Current Outpatient Medications  Medication Sig Dispense Refill  . amLODipine (NORVASC) 5 MG tablet TAKE 1 TABLET BY MOUTH ONCE A DAY 90 tablet 3  . atorvastatin (LIPITOR) 10 MG tablet TAKE 1 TABLET BY MOUTH ONCE A DAY 90 tablet 3  . Cholecalciferol 25 MCG (1000 UT) tablet Take 1,000 Units by mouth daily.    . famotidine (PEPCID) 40 MG tablet TAKE 1 TABLET BY MOUTH ONCE A DAY 90 tablet 1  . metoprolol succinate (TOPROL-XL) 50 MG 24 hr tablet TAKE 1 TABLET BY MOUTH DAILY. TAKE WITH OR IMMEDIATELY FOLLOWING A MEAL (Patient taking differently: Take by mouth daily. TAKE WITH OR IMMEDIATELY FOLLOWING A MEAL.) 90 tablet 3  . olmesartan-hydrochlorothiazide (BENICAR HCT) 40-12.5 MG tablet Take 1 tablet by mouth daily. 30 tablet 3  . pantoprazole (PROTONIX) 40 MG tablet TAKE 1 TABLET (40 MG TOTAL) BY MOUTH DAILY. 30 tablet 3  . vortioxetine HBr (TRINTELLIX) 20 MG TABS tablet TAKE 1 TABLET BY MOUTH ONCE A DAY 90 tablet 1   No current facility-administered medications for this visit.   No Known Allergies   Review of Systems: All systems reviewed and negative except where noted in HPI.     Physical Exam:    Wt Readings from Last 3 Encounters:  05/19/20 258 lb 6 oz (117.2 kg)  04/20/20 259 lb (117.5 kg)  03/28/20 260 lb (117.9 kg)    BP 134/78   Pulse (!) 106   Ht 5' 10"  (1.778 m)   Wt 258 lb 6 oz (117.2 kg)   BMI 37.07 kg/m  Constitutional:  Pleasant, in no acute distress. Psychiatric: Normal mood and affect. Behavior is normal. EENT: Pupils normal.  Conjunctivae are normal. No scleral icterus. Neck supple. No cervical LAD. Cardiovascular: Normal rate, regular rhythm. No edema Pulmonary/chest: Effort normal and breath sounds normal. No wheezing, rales or rhonchi. Abdominal: Soft,  nondistended, nontender. Bowel sounds active throughout. There are no masses palpable. No hepatomegaly. Neurological: Alert and oriented to person place and time. Skin: Skin is warm and dry. No rashes noted.   ASSESSMENT AND PLAN;   1) GERD 2) Dysphagia - Resume Protonix and Pepcid as currently prescribed - EGD to evaluate for esophageal stricture, erosive esophagitis, hiatal hernia, LES laxity with esophageal dilation as appropriate - Continue antireflux lifestyle/dietary modifications  3) Elevated liver enzymes 4) Hepatic steatosis/Fatty liver Elevated ALT>AST  which had up trended from 2017 to 2021, and stable since then.  RUQ [place] and MRI liver 2021 also with hepatic steatosis. Given these findings, his body habitus (obesity, BMI 37) and history of metabolic syndrome comorbidities (HTN, HLD), favor NAFLD as the underlying etiology for elevated LAEs.  Discussed this diagnosis and pathophysiology at length today.  Discussed further serologic testing  Vs trial of diet/exercise, and patient elected for the latter. - Given the presumed diagnosis of fatty liver disease, I counseled the patient on the importance of diet and exercise, with a modest weight loss of 10% of total body weight, done slowly over weeks to months.   - Recommended low-fat/cholesterol/carbohydrate diet, restrict calories - Repeat liver enzymes in 6 months.  If still elevated despite diet/exercise, plan for extended serologic work-up, imaging, fib 4 score, etc.  5) Obesity (BMI 37) -Discussed relationship of obesity and underlying fatty liver disease along with exacerbation of reflux.  Patient has restarted dieting with intentional weight loss.  Applauded him in those efforts.  The indications, risks, and benefits of EGD were explained to the patient in detail. Risks include but are not limited to bleeding, perforation, adverse reaction to medications, and cardiopulmonary compromise. Sequelae include but are not limited to the  possibility of surgery, hospitalization, and mortality. The patient verbalized understanding and wished to proceed. All questions answered, referred to scheduler. Further recommendations pending results of the exam.    Lavena Bullion, DO, FACG  05/19/2020, 2:29 PM   Birdie Riddle Aundra Millet, MD

## 2020-05-19 NOTE — Patient Instructions (Signed)
If you are age 35 or older, your body mass index should be between 23-30. Your Body mass index is 37.07 kg/m. If this is out of the aforementioned range listed, please consider follow up with your Primary Care Provider.  If you are age 24 or younger, your body mass index should be between 19-25. Your Body mass index is 37.07 kg/m. If this is out of the aformentioned range listed, please consider follow up with your Primary Care Provider.    Due to recent changes in healthcare laws, you may see the results of your imaging and laboratory studies on MyChart before your provider has had a chance to review them.  We understand that in some cases there may be results that are confusing or concerning to you. Not all laboratory results come back in the same time frame and the provider may be waiting for multiple results in order to interpret others.  Please give [place] 48 hours in order for your provider to thoroughly review all the results before contacting the office for clarification of your results.   Follow up in the clinic in 6 months, please call [phone] to schedule.  Thank you for choosing me and [facility].  Vito Cirigliano, D.O.

## 2020-05-22 ENCOUNTER — Other Ambulatory Visit (HOSPITAL_COMMUNITY): Payer: Self-pay

## 2020-05-22 MED FILL — Pantoprazole Sodium EC Tab 40 MG (Base Equiv): ORAL | 30 days supply | Qty: 30 | Fill #0 | Status: AC

## 2020-05-23 ENCOUNTER — Other Ambulatory Visit (HOSPITAL_COMMUNITY): Payer: Self-pay

## 2020-06-19 ENCOUNTER — Other Ambulatory Visit (HOSPITAL_COMMUNITY): Payer: Self-pay

## 2020-06-19 ENCOUNTER — Other Ambulatory Visit: Payer: Self-pay | Admitting: Family Medicine

## 2020-06-19 DIAGNOSIS — F32A Depression, unspecified: Secondary | ICD-10-CM

## 2020-06-19 MED ORDER — VORTIOXETINE HBR 20 MG PO TABS
ORAL_TABLET | Freq: Every day | ORAL | 1 refills | Status: DC
  Filled 2020-06-19: qty 90, 90d supply, fill #0

## 2020-06-19 MED FILL — Amlodipine Besylate Tab 5 MG (Base Equivalent): ORAL | 90 days supply | Qty: 90 | Fill #0 | Status: AC

## 2020-06-20 ENCOUNTER — Other Ambulatory Visit (HOSPITAL_COMMUNITY): Payer: Self-pay

## 2020-07-05 ENCOUNTER — Encounter: Payer: Self-pay | Admitting: Gastroenterology

## 2020-07-05 ENCOUNTER — Ambulatory Visit (AMBULATORY_SURGERY_CENTER): Payer: No Typology Code available for payment source | Admitting: Gastroenterology

## 2020-07-05 ENCOUNTER — Other Ambulatory Visit: Payer: Self-pay

## 2020-07-05 VITALS — BP 122/84 | HR 60 | Temp 97.5°F | Resp 15 | Ht 70.0 in | Wt 258.0 lb

## 2020-07-05 DIAGNOSIS — R1319 Other dysphagia: Secondary | ICD-10-CM | POA: Diagnosis not present

## 2020-07-05 DIAGNOSIS — K229 Disease of esophagus, unspecified: Secondary | ICD-10-CM | POA: Diagnosis not present

## 2020-07-05 DIAGNOSIS — K317 Polyp of stomach and duodenum: Secondary | ICD-10-CM | POA: Diagnosis not present

## 2020-07-05 DIAGNOSIS — Q398 Other congenital malformations of esophagus: Secondary | ICD-10-CM

## 2020-07-05 DIAGNOSIS — K219 Gastro-esophageal reflux disease without esophagitis: Secondary | ICD-10-CM

## 2020-07-05 MED ORDER — SODIUM CHLORIDE 0.9 % IV SOLN: 500.0000 mL | Freq: Once | INTRAVENOUS | Status: DC

## 2020-07-05 NOTE — Patient Instructions (Signed)
  The biopsies taken today have been sent for pathology.  The results can take 1-3 weeks to receive.    You may resume your previous diet and medication schedule.  Thank you for allowing [place] to care for you today!!!   YOU HAD AN ENDOSCOPIC PROCEDURE TODAY AT [facility]:   Refer to the procedure report that was given to you for any specific questions about what was found during the examination.  If the procedure report does not answer your questions, please call your gastroenterologist to clarify.  If you requested that your care partner not be given the details of your procedure findings, then the procedure report has been included in a sealed envelope for you to review at your convenience later.  YOU SHOULD EXPECT: Some feelings of bloating in the abdomen. Passage of more gas than usual.  Walking can help get rid of the air that was put into your GI tract during the procedure and reduce the bloating.   Please Note:  You might notice some irritation and congestion in your nose or some drainage.  This is from the oxygen used during your procedure.  There is no need for concern and it should clear up in a day or so.  SYMPTOMS TO REPORT IMMEDIATELY:   Following upper endoscopy (EGD)  Vomiting of blood or coffee ground material  New chest pain or pain under the shoulder blades  Painful or persistently difficult swallowing  New shortness of breath  Fever of 100F or higher  Black, tarry-looking stools  For urgent or emergent issues, a gastroenterologist can be reached at any hour by calling [phone]. Do not use MyChart messaging for urgent concerns.    DIET:  We do recommend a small meal at first, but then you may proceed to your regular diet.  Drink plenty of fluids but you should avoid alcoholic beverages for 24 hours.  ACTIVITY:  You should plan to take it easy for the rest of today and you should NOT DRIVE or use heavy machinery until tomorrow (because of the  sedation medicines used during the test).    FOLLOW UP: Our staff will call the number listed on your records Friday morning between 7:15 am and 8:15 am following your procedure to check on you and address any questions or concerns that you may have regarding the information given to you following your procedure. If we do not reach you, we will leave a message.  We will attempt to reach you two times.  During this call, we will ask if you have developed any symptoms of COVID 19. If you develop any symptoms (ie: fever, flu-like symptoms, shortness of breath, cough etc.) before then, please call [phone].  If you test positive for Covid 19 in the 2 weeks post procedure, please call and report this information to [place].    If any biopsies were taken you will be contacted by phone or by letter within the next 1-3 weeks.  Please call us at [phone] if you have not heard about the biopsies in 3 weeks.    SIGNATURES/CONFIDENTIALITY: You and/or your care partner have signed paperwork which will be entered into your electronic medical record.  These signatures attest to the fact that that the information above on your After Visit Summary has been reviewed and is understood.  Full responsibility of the confidentiality of this discharge information lies with you and/or your care-partner.

## 2020-07-05 NOTE — Progress Notes (Signed)
Report to PACU, RN, vss, BBS= Clear.  

## 2020-07-05 NOTE — Progress Notes (Signed)
VS- Arman Bogus RN

## 2020-07-05 NOTE — Op Note (Signed)
[facility] Patient Name: Nicholas Robertson Procedure Date: 07/05/2020 1:58 PM MRN: [id number] Endoscopist: Gerrit Heck , MD Age: 35 Referring MD:  Date of Birth: Dec 20, 1985 Gender: Male Account #: [id number] Procedure:                Upper GI endoscopy Indications:              Dysphagia, Suspected esophageal reflux Medicines:                Monitored Anesthesia Care Procedure:                Pre-Anesthesia Assessment:                           - Prior to the procedure, a History and Physical                            was performed, and patient medications and                            allergies were reviewed. The patient's tolerance of                            previous anesthesia was also reviewed. The risks                            and benefits of the procedure and the sedation                            options and risks were discussed with the patient.                            All questions were answered, and informed consent                            was obtained. Prior Anticoagulants: The patient has                            taken no previous anticoagulant or antiplatelet                            agents. ASA Grade Assessment: II - A patient with                            mild systemic disease. After reviewing the risks                            and benefits, the patient was deemed in                            satisfactory condition to undergo the procedure.                           After obtaining informed consent, the endoscope was  passed under direct vision. Throughout the                            procedure, the patient's blood pressure, pulse, and                            oxygen saturations were monitored continuously. The                            Endoscope was introduced through the mouth, and                            advanced to the second part of duodenum. The upper                            GI endoscopy was  accomplished without difficulty.                            The patient tolerated the procedure well. Scope In: Scope Out: Findings:                 A single area of ectopic gastric mucosa was found                            in the upper third of the esophagus.                           The examined esophagus was otherwise normal. The                            scope was withdrawn. Dilation was performed with a                            Maloney dilator with no resistance at 64 Fr. The                            dilation site was examined following endoscope                            reinsertion and showed no bleeding, mucosal tear or                            perforation. Biopsies were obtained from the                            proximal and distal esophagus with cold forceps for                            histology of suspected eosinophilic esophagitis.                            Estimated blood loss was minimal.  The Z-line was regular and was found 40 cm from the                            incisors.                           The gastroesophageal flap valve was visualized                            endoscopically and classified as Hill Grade II                            (fold present, opens with respiration).                           A few small sessile polyps with no bleeding were                            found in the gastric fundus and in the gastric                            body. These polyps were removed with a cold biopsy                            forceps. Resection and retrieval were complete.                            Estimated blood loss was minimal.                           Normal mucosa was found in the entire examined                            stomach.                           The examined duodenum was normal. Complications:            No immediate complications. Estimated Blood Loss:     Estimated blood loss was minimal. Impression:                - Ectopic gastric mucosa in the upper third of the                            esophagus.                           - Normal esophagus. Dilated. Biopsied.                           - Z-line regular, 40 cm from the incisors.                           - Gastroesophageal flap valve classified as Hill  Grade II (fold present, opens with respiration).                           - A few gastric polyps. Resected and retrieved.                           - Normal mucosa was found in the entire stomach.                           - Normal examined duodenum. Recommendation:           - Patient has a contact number available for                            emergencies. The signs and symptoms of potential                            delayed complications were discussed with the                            patient. Return to normal activities tomorrow.                            Written discharge instructions were provided to the                            patient.                           - Resume previous diet.                           - Continue present medications.                           - Await pathology results.                           - Return to GI clinic at appointment to be                            scheduled.                           - If continued dysphagia and/or reflux symptoms                            despite acid suppression therapy, will plan on                            Esophageal Manometry and pH/Impedance testing OFF                            PPI therapy. Gerrit Heck, MD 07/05/2020 2:26:47 PM

## 2020-07-05 NOTE — Progress Notes (Signed)
Called to room to assist during endoscopic procedure.  Patient ID and intended procedure confirmed with present staff. Received instructions for my participation in the procedure from the performing physician.  

## 2020-07-07 ENCOUNTER — Telehealth: Payer: Self-pay

## 2020-07-07 NOTE — Telephone Encounter (Signed)
  Follow up Call-  Call back number 07/05/2020  Post procedure Call Back phone  # [phone]  Permission to leave phone message Yes  Some recent data might be hidden     Patient questions:  Do you have a fever, pain , or abdominal swelling? No. Pain Score  0 *  Have you tolerated food without any problems? Yes.    Have you been able to return to your normal activities? Yes.    Do you have any questions about your discharge instructions: Diet   No. Medications  No. Follow up visit  No.  Do you have questions or concerns about your Care? No.  Actions: * If pain score is 4 or above: No action needed, pain <4.  Have you developed a fever since your procedure? no  2.   Have you had an respiratory symptoms (SOB or cough) since your procedure? no  3.   Have you tested positive for COVID 19 since your procedure no  4.   Have you had any family members/close contacts diagnosed with the COVID 19 since your procedure?  no   If yes to any of these questions please route to Joylene John, RN and Joella Prince, RN

## 2020-07-11 ENCOUNTER — Other Ambulatory Visit (HOSPITAL_COMMUNITY): Payer: Self-pay

## 2020-07-11 MED FILL — Pantoprazole Sodium EC Tab 40 MG (Base Equiv): ORAL | 30 days supply | Qty: 30 | Fill #1 | Status: CN

## 2020-07-11 MED FILL — Pantoprazole Sodium EC Tab 40 MG (Base Equiv): ORAL | 60 days supply | Qty: 60 | Fill #1 | Status: AC

## 2020-07-11 MED FILL — Metoprolol Succinate Tab ER 24HR 50 MG (Tartrate Equiv): ORAL | 90 days supply | Qty: 90 | Fill #0 | Status: AC

## 2020-07-12 ENCOUNTER — Encounter: Payer: Self-pay | Admitting: Gastroenterology

## 2020-07-13 ENCOUNTER — Other Ambulatory Visit (HOSPITAL_COMMUNITY): Payer: Self-pay

## 2020-07-28 ENCOUNTER — Telehealth (INDEPENDENT_AMBULATORY_CARE_PROVIDER_SITE_OTHER): Payer: No Typology Code available for payment source | Admitting: Family Medicine

## 2020-07-28 ENCOUNTER — Encounter: Payer: Self-pay | Admitting: Family Medicine

## 2020-07-28 ENCOUNTER — Other Ambulatory Visit: Payer: Self-pay

## 2020-07-28 DIAGNOSIS — H6982 Other specified disorders of Eustachian tube, left ear: Secondary | ICD-10-CM

## 2020-07-28 DIAGNOSIS — L2082 Flexural eczema: Secondary | ICD-10-CM | POA: Diagnosis not present

## 2020-07-28 MED ORDER — TRIAMCINOLONE ACETONIDE 0.1 % EX OINT: 1.0000 "application " | TOPICAL_OINTMENT | Freq: Two times a day (BID) | CUTANEOUS | 1 refills | Status: DC

## 2020-07-28 NOTE — Progress Notes (Signed)
   Virtual Visit via Video   I connected with patient on 07/28/20 at  9:30 AM EDT by a video enabled telemedicine application and verified that I am speaking with the correct person using two identifiers.  Location patient: Home Location provider: [facility], Office Persons participating in the virtual visit: Patient, Provider, [place] (Sabrina M)  I discussed the limitations of evaluation and management by telemedicine and the availability of in person appointments. The patient expressed understanding and agreed to proceed.  Subjective:   HPI:   Ear pain- pt reports he had eustachian tube dysfxn on L side.  Was using Flonase and Claritin but then developed ear pain.  Started Allegra D w/ improvement in ear pain.  Pt reports feeling well w/ exception of L maxillary sinus pressure.  Denies fevers/chills, cough, HA.  Eczema- pt reports recurrent sxs on R posterior knee.  Needs steroid prescription  ROS:   See pertinent positives and negatives per HPI.  Patient Active Problem List   Diagnosis Date Noted   Gastroesophageal reflux disease 04/20/2020   Neck pain 08/02/2019   Obesity (BMI 30-39.9) 08/02/2019   Vitamin D deficiency 03/08/2019   Seborrhea 03/08/2019   Ingrown toenail of both feet 07/05/2018   Vitamin B12 deficiency 09/12/2016   Anxiety and depression 04/18/2013   Preventative health care 04/18/2013   Wolff-Parkinson-White (WPW) syndrome 02/17/2013   Essential hypertension 02/07/2013   Hyperlipidemia 02/07/2013   Abnormal LFTs 02/07/2013    Social History   Tobacco Use   Smoking status: Never   Smokeless tobacco: Never  Substance Use Topics   Alcohol use: No    Current Outpatient Medications:    amLODipine (NORVASC) 5 MG tablet, TAKE 1 TABLET BY MOUTH ONCE A DAY, Disp: 90 tablet, Rfl: 3   atorvastatin (LIPITOR) 10 MG tablet, TAKE 1 TABLET BY MOUTH ONCE A DAY, Disp: 90 tablet, Rfl: 3   Cholecalciferol 25 MCG (1000 UT) tablet, Take 1,000 Units by mouth  daily., Disp: , Rfl:    cyanocobalamin 1000 MCG tablet, Take 1,000 mcg by mouth daily., Disp: , Rfl:    metoprolol succinate (TOPROL-XL) 50 MG 24 hr tablet, TAKE 1 TABLET BY MOUTH DAILY. TAKE WITH OR IMMEDIATELY FOLLOWING A MEAL, Disp: 90 tablet, Rfl: 3   olmesartan-hydrochlorothiazide (BENICAR HCT) 40-12.5 MG tablet, Take 1 tablet by mouth daily., Disp: 30 tablet, Rfl: 3   pantoprazole (PROTONIX) 40 MG tablet, TAKE 1 TABLET (40 MG TOTAL) BY MOUTH DAILY., Disp: 30 tablet, Rfl: 3   vortioxetine HBr (TRINTELLIX) 20 MG TABS tablet, TAKE 1 TABLET BY MOUTH ONCE A DAY, Disp: 90 tablet, Rfl: 1   famotidine (PEPCID) 40 MG tablet, TAKE 1 TABLET BY MOUTH ONCE A DAY, Disp: 90 tablet, Rfl: 1  No Known Allergies  Objective:   There were no vitals taken for this visit. AAOx3, NAD NCAT, EOMI No obvious CN deficits Coloring WNL Pt is able to speak clearly, coherently without shortness of breath or increased work of breathing.  Thought process is linear.  Mood is appropriate.   Assessment and Plan:   Eczema- ongoing issue for pt.  Start Triamcinolone ointment PRN  Eustachian tube dysfxn- improved w/ addition of decongestant..  Reviewed impact on BP and to use as little as possible.  Pt aware.   Annye Asa, MD 07/28/2020

## 2020-07-28 NOTE — Progress Notes (Signed)
I connected with  Nicholas Robertson on 07/28/20 by a video enabled telemedicine application and verified that I am speaking with the correct person using two identifiers.   I discussed the limitations of evaluation and management by telemedicine. The patient expressed understanding and agreed to proceed.

## 2020-08-10 ENCOUNTER — Other Ambulatory Visit (HOSPITAL_COMMUNITY): Payer: Self-pay

## 2020-08-10 MED FILL — Atorvastatin Calcium Tab 10 MG (Base Equivalent): ORAL | 90 days supply | Qty: 90 | Fill #0 | Status: AC

## 2020-08-11 ENCOUNTER — Other Ambulatory Visit (HOSPITAL_COMMUNITY): Payer: Self-pay

## 2020-09-01 ENCOUNTER — Ambulatory Visit: Payer: No Typology Code available for payment source | Admitting: Internal Medicine

## 2020-09-04 ENCOUNTER — Ambulatory Visit: Payer: No Typology Code available for payment source | Admitting: Internal Medicine

## 2020-09-04 ENCOUNTER — Encounter: Payer: Self-pay | Admitting: Internal Medicine

## 2020-09-04 ENCOUNTER — Other Ambulatory Visit (HOSPITAL_BASED_OUTPATIENT_CLINIC_OR_DEPARTMENT_OTHER): Payer: Self-pay

## 2020-09-04 ENCOUNTER — Other Ambulatory Visit: Payer: Self-pay

## 2020-09-04 ENCOUNTER — Ambulatory Visit (HOSPITAL_BASED_OUTPATIENT_CLINIC_OR_DEPARTMENT_OTHER)
Admission: RE | Admit: 2020-09-04 | Discharge: 2020-09-04 | Disposition: A | Discharge status: Home / Self Care | Payer: No Typology Code available for payment source | Source: Ambulatory Visit | Attending: Internal Medicine | Admitting: Internal Medicine

## 2020-09-04 ENCOUNTER — Ambulatory Visit (INDEPENDENT_AMBULATORY_CARE_PROVIDER_SITE_OTHER): Payer: No Typology Code available for payment source | Admitting: Internal Medicine

## 2020-09-04 VITALS — BP 126/82 | HR 69 | Temp 98.3°F | Resp 16 | Ht 70.0 in | Wt 257.1 lb

## 2020-09-04 DIAGNOSIS — R059 Cough, unspecified: Secondary | ICD-10-CM

## 2020-09-04 DIAGNOSIS — R079 Chest pain, unspecified: Secondary | ICD-10-CM

## 2020-09-04 MED ORDER — FLUTICASONE-SALMETEROL 100-50 MCG/ACT IN AEPB
1.0000 | INHALATION_SPRAY | Freq: Two times a day (BID) | RESPIRATORY_TRACT | 3 refills | Status: DC
  Filled 2020-09-04: qty 60, 30d supply, fill #0

## 2020-09-04 MED ORDER — ALBUTEROL SULFATE HFA 108 (90 BASE) MCG/ACT IN AERS
2.0000 | INHALATION_SPRAY | Freq: Four times a day (QID) | RESPIRATORY_TRACT | 2 refills | Status: DC | PRN
  Filled 2020-09-04: qty 18, 25d supply, fill #0

## 2020-09-04 NOTE — Progress Notes (Signed)
Subjective:    Patient ID: Nicholas Robertson, male    DOB: 1986-01-05, 35 y.o.   MRN: [id number]  DOS:  09/04/2020 Type of visit - description: Acute Having chest tightness for a while. Symptoms are a tightness feelings on the upper bilateral chest, not exertional but typically after he does yard work.  Also after he takes a shower.  No radiation to the neck. Associated symptoms: Occasionally feels the urge to cough and wheezing. No history of previous asthma  History of GERD, currently well controlled.   Review of Systems See above   Past Medical History:  Diagnosis Date   Abnormal LFTs     Anal fissure    Anxiety    Deficiency of vitamin B12 09/12/2016   Family history of adverse reaction to anesthesia    father is hard to wake after anesthesia   Fever, unspecified 07/31/2013   GERD (gastroesophageal reflux disease)    HTN (hypertension)     Kidney stones    Other and unspecified hyperlipidemia     Preventative health care 04/18/2013   Strep pharyngitis 10/20/2012   WPW (Wolff-Parkinson-White syndrome)     s/p ablation 02/2013 by [name]    Past Surgical History:  Procedure Laterality Date   ABLATION  03/09/2013   EPS and RFCA of manifest right posteroloateral accessory pathway   EXTRACORPOREAL SHOCK WAVE LITHOTRIPSY Left 11/08/2019   Procedure: EXTRACORPOREAL SHOCK WAVE LITHOTRIPSY (ESWL);  Surgeon: [name], MD;  Location: [facility];  Service: Urology;  Laterality: Left;   [place]   [facility] [place]   SIGMOIDOSCOPY     SUPRAVENTRICULAR TACHYCARDIA ABLATION N/A 03/08/2013   Procedure: WPW ABLATION;  Surgeon: [name], MD;  Location: [facility] CATH LAB;  Service: Cardiovascular;  Laterality: N/A;    Allergies as of 09/04/2020   No Known Allergies      Medication List        Accurate as of September 04, 2020  8:30 AM. If you have any questions, ask your nurse or doctor.          amLODipine 5 MG  tablet Commonly known as: NORVASC TAKE 1 TABLET BY MOUTH ONCE A DAY   atorvastatin 10 MG tablet Commonly known as: LIPITOR TAKE 1 TABLET BY MOUTH ONCE A DAY   Cholecalciferol 25 MCG (1000 UT) tablet Take 1,000 Units by mouth daily.   cyanocobalamin 1000 MCG tablet Take 1,000 mcg by mouth daily.   famotidine 40 MG tablet Commonly known as: PEPCID TAKE 1 TABLET BY MOUTH ONCE A DAY   metoprolol succinate 50 MG 24 hr tablet Commonly known as: TOPROL-XL TAKE 1 TABLET BY MOUTH DAILY. TAKE WITH OR IMMEDIATELY FOLLOWING A MEAL   olmesartan-hydrochlorothiazide 40-12.5 MG tablet Commonly known as: Benicar HCT Take 1 tablet by mouth daily.   pantoprazole 40 MG tablet Commonly known as: PROTONIX TAKE 1 TABLET (40 MG TOTAL) BY MOUTH DAILY.   triamcinolone ointment 0.1 % Commonly known as: KENALOG Apply 1 application topically 2 (two) times daily.   Trintellix 20 MG Tabs tablet Generic drug: vortioxetine HBr TAKE 1 TABLET BY MOUTH ONCE A DAY           Objective:   Physical Exam BP 126/82 (BP Location: Left Arm, Patient Position: Sitting, Cuff Size: Normal)   Pulse 69   Temp 98.3 F (36.8 C) (Oral)   Resp 16   Ht '5\' 10"'$  (1.778 m)   Wt 257 lb 2  oz (116.6 kg)   SpO2 97%   BMI 36.89 kg/m  General:   Well developed, NAD, BMI noted. HEENT:  Normocephalic . Face symmetric, atraumatic Lungs:  CTA B Normal respiratory effort, no intercostal retractions, no accessory muscle use. Heart: RRR,  no murmur.  Lower extremities: no pretibial edema bilaterally  Skin: Not pale. Not jaundice Neurologic:  alert & oriented X3.  Speech normal, gait appropriate for age and unassisted Psych--  Cognition and judgment appear intact.  Cooperative with normal attention span and concentration.  Behavior appropriate. No anxious or depressed appearing.      Assessment     35 year old male, PMH includes anxiety, GERD, HTN, kidney stones, WPW ablation 2015, presents with:  Chest  tightness, cough, question of wheezing. Presents with above symptoms, patient suspects bronchospasm, I agree. I recognize he has risk factor for CAD but symptoms are not typical for angina. Plan:  Chest x-ray, start a trial with Advair twice daily and albuterol as needed. If symptoms promptly respond, then the diagnosis becomes more certain. He has prediabetes, high cholesterol, HTN: Recommend to follow-up regularly with PCP    This visit occurred during the SARS-CoV-2 public health emergency.  Safety protocols were in place, including screening questions prior to the visit, additional usage of staff PPE, and extensive cleaning of exam room while observing appropriate contact time as indicated for disinfecting solutions.

## 2020-09-04 NOTE — Patient Instructions (Signed)
Proceed with your chest x-ray downstairs  Start Advair twice daily  Use albuterol 2 puffs every 6 hours as needed for tightness, wheezing, cough.  Call if questions or concerns  Please see your PCP on follow-up.

## 2020-09-05 ENCOUNTER — Other Ambulatory Visit (HOSPITAL_BASED_OUTPATIENT_CLINIC_OR_DEPARTMENT_OTHER): Payer: Self-pay

## 2020-09-14 ENCOUNTER — Encounter: Payer: Self-pay | Admitting: Family Medicine

## 2020-09-14 ENCOUNTER — Other Ambulatory Visit (HOSPITAL_COMMUNITY): Payer: Self-pay

## 2020-09-14 DIAGNOSIS — F419 Anxiety disorder, unspecified: Secondary | ICD-10-CM

## 2020-09-14 DIAGNOSIS — F32A Depression, unspecified: Secondary | ICD-10-CM

## 2020-09-14 MED ORDER — OLMESARTAN MEDOXOMIL-HCTZ 40-12.5 MG PO TABS
1.0000 | ORAL_TABLET | Freq: Every day | ORAL | 1 refills | Status: DC
  Filled 2020-09-14: qty 90, 90d supply, fill #0
  Filled 2020-12-06 (×2): qty 90, 90d supply, fill #1

## 2020-09-14 MED ORDER — VORTIOXETINE HBR 20 MG PO TABS
ORAL_TABLET | Freq: Every day | ORAL | 1 refills | Status: DC
  Filled 2020-09-14: qty 90, 90d supply, fill #0
  Filled 2020-12-25: qty 90, 90d supply, fill #1

## 2020-09-14 MED ORDER — PANTOPRAZOLE SODIUM 40 MG PO TBEC
DELAYED_RELEASE_TABLET | Freq: Every day | ORAL | 1 refills | Status: DC
  Filled 2020-09-14: qty 90, 90d supply, fill #0
  Filled 2020-12-06: qty 90, 90d supply, fill #1

## 2020-09-18 ENCOUNTER — Other Ambulatory Visit (HOSPITAL_COMMUNITY): Payer: Self-pay

## 2020-09-18 MED FILL — Amlodipine Besylate Tab 5 MG (Base Equivalent): ORAL | 90 days supply | Qty: 90 | Fill #1 | Status: AC

## 2020-10-08 ENCOUNTER — Other Ambulatory Visit: Payer: Self-pay | Admitting: Family Medicine

## 2020-10-08 DIAGNOSIS — I456 Pre-excitation syndrome: Secondary | ICD-10-CM

## 2020-10-08 DIAGNOSIS — I10 Essential (primary) hypertension: Secondary | ICD-10-CM

## 2020-10-09 ENCOUNTER — Other Ambulatory Visit (HOSPITAL_COMMUNITY): Payer: Self-pay

## 2020-10-09 MED ORDER — METOPROLOL SUCCINATE ER 50 MG PO TB24
ORAL_TABLET | Freq: Every day | ORAL | 3 refills | Status: DC
  Filled 2020-10-09: qty 90, 90d supply, fill #0
  Filled 2021-01-17: qty 90, 90d supply, fill #1
  Filled 2021-04-17: qty 90, 90d supply, fill #2
  Filled 2021-07-08: qty 90, 90d supply, fill #3

## 2020-10-16 ENCOUNTER — Encounter: Payer: Self-pay | Admitting: Family Medicine

## 2020-10-16 ENCOUNTER — Ambulatory Visit (INDEPENDENT_AMBULATORY_CARE_PROVIDER_SITE_OTHER): Payer: No Typology Code available for payment source | Admitting: Family Medicine

## 2020-10-16 ENCOUNTER — Other Ambulatory Visit: Payer: Self-pay

## 2020-10-16 VITALS — BP 138/90 | HR 72 | Temp 98.3°F | Resp 17 | Ht 70.0 in | Wt 261.4 lb

## 2020-10-16 DIAGNOSIS — R0789 Other chest pain: Secondary | ICD-10-CM

## 2020-10-16 NOTE — Progress Notes (Addendum)
Subjective:    Patient ID: Nicholas Robertson, male    DOB: Mar 10, 1985, 35 y.o.   MRN: [id number]  HPI Chest tightness- pt has hx of WPW.  Currently on Amlodipine, Metoprolol, Olmesartan HCT w/ adequate BP control.  Initially felt like reactive airway- getting out of the shower or when humid.  Was given Advair and albuterol- unable to tolerate Advair b/c 'made my heart feel weird'.  Occurred after exertion.  This has now become an 'all day event'.  No wheezing.  + chest tightness- substernal that will radiate along ribs and occasionally around to back.  Will try and cough to get relief.  Feels he is unable to take a full breath.  No reflux- on PPI + Famotidine.  On Xyzal daily.  No increased anxiety.  EKG in March WNL.  CXR WNL   Review of Systems For ROS see HPI   This visit occurred during the SARS-CoV-2 public health emergency.  Safety protocols were in place, including screening questions prior to the visit, additional usage of staff PPE, and extensive cleaning of exam room while observing appropriate contact time as indicated for disinfecting solutions.      Objective:   Physical Exam Vitals reviewed.  Constitutional:      General: He is not in acute distress.    Appearance: He is well-developed. He is obese. He is not ill-appearing.  HENT:     Head: Normocephalic and atraumatic.  Neck:     Vascular: No JVD.  Cardiovascular:     Rate and Rhythm: Normal rate and regular rhythm.     Pulses: Normal pulses.     Heart sounds: Normal heart sounds.  Pulmonary:     Effort: Pulmonary effort is normal. No tachypnea or respiratory distress.     Breath sounds: Normal breath sounds. No wheezing or rhonchi.  Musculoskeletal:     Right lower leg: No edema.     Left lower leg: No edema.  Lymphadenopathy:     Cervical: No cervical adenopathy.  Skin:    General: Skin is warm and dry.  Neurological:     General: No focal deficit present.     Mental Status: He is alert and oriented to person,  place, and time.  Psychiatric:        Mood and Affect: Mood normal.        Behavior: Behavior normal.          Assessment & Plan:   Chest tightness- deteriorated.  Pt had similar sxs in the spring but at that time was only humidity or exertion induced.  Now is occurring daily and for most of the day.  EKG done in March was normal.  CXR in August WNL.  He was not able to tolerate Advair due to hx of WPW.  Is able to tolerate Albuterol but doesn't note improvement in sxs w/ use.  Given his hx of WPW, his current HTN and obesity, along w/ family hx, he is at risk for cardiac issues.  He has not yet had a stress test so I will refer to Cards to get this done.  I will also place a pulmonary referral given his feeling of not being able to take a full breath.  In the meantime, we will start preventative BID albuterol (since he wasn't able to tolerate an inhaled steroid), double his PPI to BID in case of silent reflux, and await his referral appts.  Will also add Carafate in case of increased reflux.  Pt expressed understanding and is in agreement w/ plan.

## 2020-10-16 NOTE — Patient Instructions (Addendum)
Follow up as needed or as scheduled We'll call you with your Pulmonary and Cardiology appts Start taking 2 puffs of Albuterol twice daily as preventative airway relief Continue Xyzal daily Increase the Pantoprazole to BID Call with any questions or concerns Hang in there!!

## 2020-10-20 MED ORDER — SUCRALFATE 1 G PO TABS: 1.0000 g | ORAL_TABLET | Freq: Three times a day (TID) | ORAL | 1 refills | Status: DC

## 2020-10-20 NOTE — Addendum Note (Signed)
Addended by: Midge Minium on: 10/20/2020 03:22 PM   Modules accepted: Orders

## 2020-10-30 ENCOUNTER — Encounter: Payer: Self-pay | Admitting: Pulmonary Disease

## 2020-10-30 ENCOUNTER — Ambulatory Visit (INDEPENDENT_AMBULATORY_CARE_PROVIDER_SITE_OTHER): Payer: No Typology Code available for payment source | Admitting: Pulmonary Disease

## 2020-10-30 ENCOUNTER — Other Ambulatory Visit: Payer: Self-pay

## 2020-10-30 VITALS — BP 150/100 | HR 83 | Temp 98.5°F | Ht 70.0 in | Wt 261.6 lb

## 2020-10-30 DIAGNOSIS — J45990 Exercise induced bronchospasm: Secondary | ICD-10-CM | POA: Diagnosis not present

## 2020-10-30 DIAGNOSIS — R0789 Other chest pain: Secondary | ICD-10-CM | POA: Insufficient documentation

## 2020-10-30 DIAGNOSIS — I10 Essential (primary) hypertension: Secondary | ICD-10-CM

## 2020-10-30 HISTORY — DX: Other chest pain: R07.89

## 2020-10-30 NOTE — Assessment & Plan Note (Signed)
Blood pressure high today, he has not taken all his meds.  He is monitoring this at home.

## 2020-10-30 NOTE — Patient Instructions (Addendum)
We will rule out airway obstruction as a cause of chest tightness Empiric reflux therapy does not seem to have helped  Spirometry pre and post exercise , if abnormal then can do postbronchodilator testing   If this and cardiac testing is negative, then we will proceed with home sleep testing

## 2020-10-30 NOTE — Assessment & Plan Note (Signed)
Because of chest tightness is not clear from our interview today, we will rule out airway obstruction as a cause of chest tightness Empiric reflux therapy does not seem to have helped Empiric bronchodilator therapy does not seem to have helped   Spirometry pre and post exercise , if abnormal then can do postbronchodilator testing   If this and cardiac testing is negative, then we will proceed with home sleep testing

## 2020-10-30 NOTE — Progress Notes (Signed)
Subjective:    Patient ID: Nicholas Robertson, male    DOB: 02-05-1985, 35 y.o.   MRN: [id number]  HPI  Chief Complaint  Patient presents with   Consult    Patient has been having chest tightness and feels like he can't get a deep breath for about 5-6 months. Not really having shortness of breath. States that he can wake up with is and it is there all day.     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  Sometimes he wakes up with this.  He milliner makes this worse.  He can get on his bike for 20 minutes and not have this problem but when he stops exercising feels  chest tightness and cannot take a deep breath.  He denies obvious wheezing, has an occasional dry cough. He has seen [name] [name] for this.  Chest x-ray 08/2020 which independently reviewed shows clear lungs without infiltrates or effusions. He was given Advair MDI but this caused his chest to hurt more.  He tried albuterol MDI without any relief. He is currently on PPI as it empiric therapy for GERD He uses Xyzal and Flonase for nasal congestion on a as needed basis  PMH -WPW status post ablation 2015 , retention diagnosed at age 43, renal ultrasound negative  Environment -lives with his spouse, has 2 cats older aged 28     Significant tests/ events reviewed  03/2019 FESS > > deviated nasal septum to the left 06/2020 EGD normal    Past Medical History:  Diagnosis Date   Abnormal LFTs     Anal fissure    Anxiety    Deficiency of vitamin B12 09/12/2016   Family history of adverse reaction to anesthesia    father is hard to wake after anesthesia   Fever, unspecified 07/31/2013   GERD (gastroesophageal reflux disease)    HTN (hypertension)      Kidney stones    Other and unspecified hyperlipidemia     Preventative health care 04/18/2013   Strep pharyngitis 10/20/2012   WPW (Wolff-Parkinson-White syndrome)     s/p ablation 02/2013 by [name]     Past Surgical History:  Procedure Laterality Date   ABLATION  03/09/2013   EPS and RFCA of manifest right posteroloateral accessory pathway   EXTRACORPOREAL SHOCK WAVE LITHOTRIPSY Left 11/08/2019   Procedure: EXTRACORPOREAL SHOCK WAVE LITHOTRIPSY (ESWL);  Surgeon: [name], MD;  Location: [facility];  Service: Urology;  Laterality: Left;   [place]   [facility] [place]   SIGMOIDOSCOPY     SUPRAVENTRICULAR TACHYCARDIA ABLATION N/A 03/08/2013   Procedure: WPW ABLATION;  Surgeon: [name], MD;  Location: [facility] CATH LAB;  Service: Cardiovascular;  Laterality: N/A;      No Known Allergies  Social History   Socioeconomic History   Marital status: Married    Spouse name: Not on file   Number of children: Not on file   Years of education: Not on file   Highest education level: Not on file  Occupational History   Not on file  Tobacco Use   Smoking status: Never   Smokeless tobacco: Never  Vaping Use   Vaping Use: Never used  Substance  and Sexual Activity   Alcohol use: No   Drug use: No   Sexual activity: Yes    Comment: no dietary restricitons, lives alone with cat, seat belts  Other Topics Concern   Not on file  Social History Narrative   Works at [employer], as [place] with partner   No major dietary restrictions.    Pet: cat, dog   Social Determinants of Radio broadcast assistant Strain: Not on file  Food Insecurity: Not on file  Transportation Needs: Not on file  Physical Activity: Not on file  Stress: Not on file  Social Connections: Not on file  Intimate Partner Violence: Not on file   Family History  Problem Relation Age of Onset   Hypertension Mother    Hyperlipidemia Mother     Mental illness Mother        anxiety   Other Mother        glucose intolerant   Colon polyps Mother    Diabetes Father        2   Hypertension Father    Arthritis Father    Colon polyps Father    Psoriasis Maternal Grandmother    Hypertension Maternal Grandmother    Cancer Maternal Grandmother        lymphoma   Hyperlipidemia Maternal Grandfather    Heart disease Maternal Grandfather        cabg   Hypertension Maternal Grandfather    Polymyalgia rheumatica Maternal Grandfather    Hypertension Paternal Grandmother    Diabetes Paternal Grandmother        2   COPD Paternal Grandmother    Heart disease Paternal Grandfather        s/p MI, s/p CABG   Hyperlipidemia Paternal Grandfather    Hypertension Paternal Grandfather    Diabetes Paternal Grandfather        1   Colon cancer Neg Hx    Esophageal cancer Neg Hx    Rectal cancer Neg Hx    Stomach cancer Neg Hx      Review of Systems  Indigestion Nasal congestion Anxiety and depression  Constitutional: negative for anorexia, fevers and sweats  Eyes: negative for irritation, redness and visual disturbance  Ears, nose, mouth, throat, and face: negative for earaches, epistaxis and sore throat  Respiratory: negative for cough, sputum and wheezing  Cardiovascular: negative for lower extremity edema, orthopnea, palpitations and syncope  Gastrointestinal: negative for abdominal pain, constipation, diarrhea, melena, nausea and vomiting  Genitourinary:negative for dysuria, frequency and hematuria  Hematologic/lymphatic: negative for bleeding, easy bruising and lymphadenopathy  Musculoskeletal:negative for arthralgias, muscle weakness and stiff joints  Neurological: negative for coordination problems, gait problems, headaches and weakness  Endocrine: negative for diabetic symptoms including polydipsia, polyuria and weight loss     Objective:   Physical Exam  Gen. Pleasant, obese, in no distress, normal affect ENT - no  pallor,icterus, no post nasal drip, class 2 airway Neck: No JVD, no thyromegaly, no carotid bruits Lungs: no use of accessory muscles, no dullness to percussion, decreased without rales or rhonchi  Cardiovascular: Rhythm regular, heart sounds  normal, no murmurs or gallops, no peripheral edema Abdomen: soft and non-tender, no hepatosplenomegaly, BS normal. Musculoskeletal: No deformities, no cyanosis or clubbing Neuro:  alert, non focal, no tremors       Assessment & Plan:

## 2020-11-15 ENCOUNTER — Encounter: Payer: No Typology Code available for payment source | Admitting: Family Medicine

## 2020-11-15 ENCOUNTER — Other Ambulatory Visit: Payer: Self-pay

## 2020-11-15 DIAGNOSIS — K602 Anal fissure, unspecified: Secondary | ICD-10-CM | POA: Insufficient documentation

## 2020-11-15 DIAGNOSIS — N200 Calculus of kidney: Secondary | ICD-10-CM | POA: Insufficient documentation

## 2020-11-17 NOTE — Telephone Encounter (Signed)
Received the following message from patient:   "[name],   I was scheduled to have my pre and post exercise spirometry today at noon. I just got a call about 10 minutes ago that the tech does not feel comfortable doing in office and so appointment was canceled until she could get specifics regarding exercise from you. I explained what we had discussed but I know she is wanting to get further clarification before doing the test or wants me to be scheduled at the hospital. I asked if she had tried spoken to you about this already but she had not and was going to send you a message about it. Just a little frustrating because I try to schedule around work so I don't have to cancel on patients. Any clarity you can give me would be greatly appreciated. I am scheduled to see the Cardiologist on November 4th so I don't know if you feel at this point I should see them first or if you think I could be set back up for PFTs before the ! Thank you for your time!   Nicholas Robertson"  RA, can you please advise? Is this something he can do in our office and will this need to be done at the hospital?

## 2020-11-20 ENCOUNTER — Other Ambulatory Visit: Payer: Self-pay

## 2020-11-20 ENCOUNTER — Ambulatory Visit (INDEPENDENT_AMBULATORY_CARE_PROVIDER_SITE_OTHER): Payer: No Typology Code available for payment source | Admitting: Pulmonary Disease

## 2020-11-20 DIAGNOSIS — J45990 Exercise induced bronchospasm: Secondary | ICD-10-CM

## 2020-11-20 LAB — PULMONARY FUNCTION TEST
FEF 25-75 Post: 4.41 L/sec
FEF 25-75 Pre: 4.36 L/sec
FEF2575-%Change-Post: 1 %
FEF2575-%Pred-Post: 102 %
FEF2575-%Pred-Pre: 101 %
FEV1-%Change-Post: 0 %
FEV1-%Pred-Post: 91 %
FEV1-%Pred-Pre: 91 %
FEV1-Post: 4.09 L
FEV1-Pre: 4.08 L
FEV1FVC-%Change-Post: 0 %
FEV1FVC-%Pred-Pre: 101 %
FEV6-%Change-Post: 0 %
FEV6-%Pred-Post: 90 %
FEV6-%Pred-Pre: 91 %
FEV6-Post: 4.95 L
FEV6-Pre: 4.98 L
FEV6FVC-%Change-Post: 0 %
FEV6FVC-%Pred-Post: 101 %
FEV6FVC-%Pred-Pre: 101 %
FVC-%Change-Post: 0 %
FVC-%Pred-Post: 89 %
FVC-%Pred-Pre: 89 %
FVC-Post: 4.99 L
FVC-Pre: 4.98 L
Post FEV1/FVC ratio: 82 %
Post FEV6/FVC ratio: 99 %
Pre FEV1/FVC ratio: 82 %
Pre FEV6/FVC Ratio: 100 %

## 2020-11-20 NOTE — Progress Notes (Signed)
Spirometry pre and post done today. 

## 2020-11-27 ENCOUNTER — Other Ambulatory Visit (HOSPITAL_COMMUNITY): Payer: Self-pay

## 2020-11-27 MED FILL — Atorvastatin Calcium Tab 10 MG (Base Equivalent): ORAL | 90 days supply | Qty: 90 | Fill #1 | Status: AC

## 2020-11-27 MED FILL — Famotidine Tab 40 MG: ORAL | 90 days supply | Qty: 90 | Fill #0 | Status: AC

## 2020-11-30 ENCOUNTER — Other Ambulatory Visit (HOSPITAL_COMMUNITY): Payer: Self-pay

## 2020-12-01 ENCOUNTER — Encounter: Payer: Self-pay | Admitting: Cardiology

## 2020-12-01 ENCOUNTER — Other Ambulatory Visit: Payer: Self-pay

## 2020-12-01 ENCOUNTER — Ambulatory Visit (INDEPENDENT_AMBULATORY_CARE_PROVIDER_SITE_OTHER): Payer: No Typology Code available for payment source | Admitting: Cardiology

## 2020-12-01 VITALS — BP 146/90 | HR 76 | Ht 70.0 in | Wt 262.1 lb

## 2020-12-01 DIAGNOSIS — E669 Obesity, unspecified: Secondary | ICD-10-CM | POA: Insufficient documentation

## 2020-12-01 DIAGNOSIS — I10 Essential (primary) hypertension: Secondary | ICD-10-CM

## 2020-12-01 DIAGNOSIS — R011 Cardiac murmur, unspecified: Secondary | ICD-10-CM

## 2020-12-01 DIAGNOSIS — R079 Chest pain, unspecified: Secondary | ICD-10-CM

## 2020-12-01 DIAGNOSIS — E782 Mixed hyperlipidemia: Secondary | ICD-10-CM | POA: Diagnosis not present

## 2020-12-01 DIAGNOSIS — R7989 Other specified abnormal findings of blood chemistry: Secondary | ICD-10-CM

## 2020-12-01 HISTORY — DX: Obesity, unspecified: E66.9

## 2020-12-01 HISTORY — DX: Chest pain, unspecified: R07.9

## 2020-12-01 HISTORY — DX: Cardiac murmur, unspecified: R01.1

## 2020-12-01 NOTE — Progress Notes (Signed)
Cardiology Office Note:    Date:  12/01/2020   ID:  Nicholas Robertson, DOB 1985-11-16, MRN [id number]  PCP:  Mosie Lukes, MD  Cardiologist:  Jenean Lindau, MD   Referring MD: Mosie Lukes, MD    ASSESSMENT:    1. Essential hypertension   2. Mixed hyperlipidemia   3. Abnormal LFTs   4. Chest pain, unspecified type   5. Obesity (BMI 35.0-39.9 without comorbidity)   6. Cardiac murmur    PLAN:    In order of problems listed above:  Primary prevention stressed to the patient.  Importance of compliance with diet medication stressed any vocalized understanding. Hypertension: Blood pressure stable and diet was emphasized.  Lifestyle modification urged.  His blood pressures are fine at home.  He has an element of whitecoat hypertension.  Salt intake issues and diet was emphasized. Mixed dyslipidemia: Lipids were elevated and he is on statin therapy but last LFTs were also elevated so I will check LFTs today.  He is not statin statin for a week.  I told him to hold off until further advice about this. Cardiac murmur: Echocardiogram will be done to assess murmur heard on auscultation. Chest pain: We will do an exercise stress echo to assess his symptoms..  Typical in nature but he has risk factors and wants to embark on an exercise program and this will help reassure him. Obesity: Weight reduction stressed risks of obesity explained and he vocalized understanding and promises to do better Elevated LFTs: As mentioned above Coronary risk stratification: He was advised about calcium scoring CT and is agreeable. Patient will be seen in follow-up appointment in 3 months or earlier if the patient has any concerns    Medication Adjustments/Labs and Tests Ordered: Current medicines are reviewed at length with the patient today.  Concerns regarding medicines are outlined above.  No orders of the defined types were placed in this encounter.  No orders of the defined types were placed in  this encounter.    History of Present Illness:    Nicholas Robertson is a 35 y.o. male who is being seen today for the evaluation of chest pain at the request of Mosie Lukes, MD. patient is a pleasant 35 year old male.  He is a Librarian, academic by profession.  He has history of essential hypertension, dyslipidemia and obesity.  He overall leads a sedentary lifestyle.  He has history of elevated LFTs.  He has not been taking his statins for the past 7 or 8 days.  Patient mentions to me that he occasionally has chest pressure and is here for evaluation for the same.  This does not occur on exertion.  No orthopnea or PND.  Sexual activity does not bring around chest pressure symptoms.  At the time of my evaluation, the patient is alert awake oriented and in no distress.  He walked 2 miles last week without any symptoms.  Past Medical History:  Diagnosis Date   Abnormal LFTs     Anal fissure    Anxiety and depression 04/18/2013   Chest tightness 10/30/2020   Essential hypertension 02/07/2013   Family history of adverse reaction to anesthesia    father is hard to wake after anesthesia   Fever, unspecified 07/31/2013   Gastroesophageal reflux disease 04/20/2020   Hyperlipidemia 02/07/2013   Ingrown toenail of both feet 07/05/2018   Kidney stones    Neck pain 08/02/2019   Obesity (BMI 30-39.9) 08/02/2019   Preventative health care 04/18/2013  Seborrhea 03/08/2019   Vitamin B12 deficiency 09/12/2016   Vitamin D deficiency 03/08/2019   Wolff-Parkinson-White (WPW) syndrome 02/17/2013   S/p ablation.    Past Surgical History:  Procedure Laterality Date   ABLATION  03/09/2013   EPS and RFCA of manifest right posteroloateral accessory pathway   EXTRACORPOREAL SHOCK WAVE LITHOTRIPSY Left 11/08/2019   Procedure: EXTRACORPOREAL SHOCK WAVE LITHOTRIPSY (ESWL);  Surgeon: [name], MD;  Location: [facility];  Service: Urology;  Laterality: Left;   [place]    [facility]   SIGMOIDOSCOPY     SUPRAVENTRICULAR TACHYCARDIA ABLATION N/A 03/08/2013   Procedure: WPW ABLATION;  Surgeon: [name], MD;  Location: [facility] CATH LAB;  Service: Cardiovascular;  Laterality: N/A;    Current Medications: Current Meds  Medication Sig   albuterol (VENTOLIN HFA) 108 (90 Base) MCG/ACT inhaler Inhale 2 puffs into the lungs every 6 (six) hours as needed for wheezing or shortness of breath.   amLODipine (NORVASC) 5 MG tablet TAKE 1 TABLET BY MOUTH ONCE A DAY   atorvastatin (LIPITOR) 10 MG tablet TAKE 1 TABLET BY MOUTH ONCE A DAY   cyanocobalamin 1000 MCG tablet Take 1,000 mcg by mouth daily.   famotidine (PEPCID) 40 MG tablet TAKE 1 TABLET BY MOUTH ONCE A DAY   metoprolol succinate (TOPROL-XL) 50 MG 24 hr tablet TAKE 1 TABLET BY MOUTH DAILY. TAKE WITH OR IMMEDIATELY FOLLOWING A MEAL   olmesartan-hydrochlorothiazide (BENICAR HCT) 40-12.5 MG tablet Take 1 tablet by mouth daily.   pantoprazole (PROTONIX) 40 MG tablet TAKE 1 TABLET (40 MG TOTAL) BY MOUTH DAILY.   vortioxetine HBr (TRINTELLIX) 20 MG TABS tablet TAKE 1 TABLET BY MOUTH ONCE A DAY     Allergies:   Patient has no known allergies.   Social History   Socioeconomic History   Marital status: Married    Spouse name: Not on file   Number of children: Not on file   Years of education: Not on file   Highest education level: Not on file  Occupational History   Not on file  Tobacco Use   Smoking status: Never   Smokeless tobacco: Never  Vaping Use   Vaping Use: Never used  Substance and Sexual Activity   Alcohol use: No   Drug use: No   Sexual activity: Yes    Comment: no dietary restricitons, lives alone with cat, seat belts  Other Topics Concern   Not on file  Social History Narrative   Works at [employer], as [place] with partner   No major dietary restrictions.    Pet: cat, dog   Social Determinants of Radio broadcast assistant Strain: Not on file   Food Insecurity: Not on file  Transportation Needs: Not on file  Physical Activity: Not on file  Stress: Not on file  Social Connections: Not on file     Family History: The patient's family history includes Arthritis in his father; COPD in his paternal grandmother; Cancer in his maternal grandmother; Colon polyps in his father and mother; Diabetes in his father, paternal grandfather, and paternal grandmother; Heart disease in his maternal grandfather and paternal grandfather; Hyperlipidemia in his maternal grandfather, mother, and paternal grandfather; Hypertension in his father, maternal grandfather, maternal grandmother, mother, paternal grandfather, and paternal grandmother; Mental illness in his mother; Other in his mother; Polymyalgia rheumatica in his maternal grandfather; Psoriasis in his maternal grandmother. There is no history of Colon cancer, Esophageal  cancer, Rectal cancer, or Stomach cancer.  ROS:   Please see the history of present illness.    All other systems reviewed and are negative.  EKGs/Labs/Other Studies Reviewed:    The following studies were reviewed today: EKG with sinus rhythm and nonspecific ST-T changes   Recent Labs: 03/28/2020: ALT 156; Hemoglobin 16.2; Platelets 211.0; TSH 2.70 04/20/2020: BUN 11; Creatinine, Ser 0.98; Potassium 3.7; Sodium 139  Recent Lipid Panel    Component Value Date/Time   CHOL 198 03/28/2020 1133   TRIG 188.0 (H) 03/28/2020 1133   HDL 45.60 03/28/2020 1133   CHOLHDL 4 03/28/2020 1133   VLDL 37.6 03/28/2020 1133   LDLCALC 114 (H) 03/28/2020 1133   LDLDIRECT 168.0 08/02/2014 0827    Physical Exam:    VS:  BP (!) 146/90   Pulse 76   Ht 5\' 10"  (1.778 m)   Wt 262 lb 1.3 oz (118.9 kg)   SpO2 96%   BMI 37.60 kg/m     Wt Readings from Last 3 Encounters:  12/01/20 262 lb 1.3 oz (118.9 kg)  10/30/20 261 lb 9.6 oz (118.7 kg)  10/16/20 261 lb 6.4 oz (118.6 kg)     GEN: Patient is in no acute distress HEENT: Normal NECK:  No JVD; No carotid bruits LYMPHATICS: No lymphadenopathy CARDIAC: S1 S2 regular, 2/6 systolic murmur at the apex. RESPIRATORY:  Clear to auscultation without rales, wheezing or rhonchi  ABDOMEN: Soft, non-tender, non-distended MUSCULOSKELETAL:  No edema; No deformity  SKIN: Warm and dry NEUROLOGIC:  Alert and oriented x 3 PSYCHIATRIC:  Normal affect    Signed, Jenean Lindau, MD  12/01/2020 9:10 AM    [place]

## 2020-12-01 NOTE — Patient Instructions (Signed)
Medication Instructions:  Your physician recommends that you continue on your current medications as directed. Please refer to the Current Medication list given to you today.  *If you need a refill on your cardiac medications before your next appointment, please call your pharmacy*   Lab Work: None ordered If you have labs (blood work) drawn today and your tests are completely normal, you will receive your results only by: [place] (if you have MyChart) OR A paper copy in the mail If you have any lab test that is abnormal or we need to change your treatment, we will call you to review the results.   Testing/Procedures: Your physician has requested that you have an echocardiogram. Echocardiography is a painless test that uses sound waves to create images of your heart. It provides your doctor with information about the size and shape of your heart and how well your heart's chambers and valves are working. This procedure takes approximately one hour. There are no restrictions for this procedure.  We will order CT coronary calcium score. It will cost $99.00 and is not covered by insurance.  Please call [phone] to schedule.   [facility]  [address],  [address]       Stress Echocardiogram Information Sheet                                                      Instructions:    1. You may take your morning medications the morning of the test  2. Light breakfast no caffeine  3. Dress prepared to exercise.  4. DO NOT use ANY caffeine or tobacco products 3 hours before appointment.  5. Please bring all current prescription medications.   Follow-Up: At [facility], you and your health needs are our priority.  As part of our continuing mission to provide you with exceptional heart care, we have created designated Provider Care Teams.  These Care Teams include your primary Cardiologist (physician) and Advanced Practice Providers (APPs -   Physician Assistants and Nurse Practitioners) who all work together to provide you with the care you need, when you need it.  We recommend signing up for the patient portal called "MyChart".  Sign up information is provided on this After Visit Summary.  MyChart is used to connect with patients for Virtual Visits (Telemedicine).  Patients are able to view lab/test results, encounter notes, upcoming appointments, etc.  Non-urgent messages can be sent to your provider as well.   To learn more about what you can do with MyChart, go to [email or web address].    Your next appointment:   3 month(s)  The format for your next appointment:   In Person  Provider:   Jyl Heinz, MD   Other Instructions Echocardiogram An echocardiogram is a test that uses sound waves (ultrasound) to produce images of the heart. Images from an echocardiogram can provide important information about: Heart size and shape. The size and thickness and movement of your heart's walls. Heart muscle function and strength. Heart valve function or if you have stenosis. Stenosis is when the heart valves are too narrow. If blood is flowing backward through the heart valves (regurgitation). A tumor or infectious growth around the heart valves. Areas of heart muscle that are not working well because of poor blood flow or injury  from a heart attack. Aneurysm detection. An aneurysm is a weak or damaged part of an artery wall. The wall bulges out from the normal force of blood pumping through the body. Tell a health care provider about: Any allergies you have. All medicines you are taking, including vitamins, herbs, eye drops, creams, and over-the-counter medicines. Any blood disorders you have. Any surgeries you have had. Any medical conditions you have. Whether you are pregnant or may be pregnant. What are the risks? Generally, this is a safe test. However, problems may occur, including an allergic reaction to dye  (contrast) that may be used during the test. What happens before the test? No specific preparation is needed. You may eat and drink normally. What happens during the test? You will take off your clothes from the waist up and put on a hospital gown. Electrodes or electrocardiogram (ECG)patches may be placed on your chest. The electrodes or patches are then connected to a device that monitors your heart rate and rhythm. You will lie down on a table for an ultrasound exam. A gel will be applied to your chest to help sound waves pass through your skin. A handheld device, called a transducer, will be pressed against your chest and moved over your heart. The transducer produces sound waves that travel to your heart and bounce back (or "echo" back) to the transducer. These sound waves will be captured in real-time and changed into images of your heart that can be viewed on a video monitor. The images will be recorded on a computer and reviewed by your health care provider. You may be asked to change positions or hold your breath for a short time. This makes it easier to get different views or better views of your heart. In some cases, you may receive contrast through an IV in one of your veins. This can improve the quality of the pictures from your heart. The procedure may vary among health care providers and hospitals.   What can I expect after the test? You may return to your normal, everyday life, including diet, activities, and medicines, unless your health care provider tells you not to do that. Follow these instructions at home: It is up to you to get the results of your test. Ask your health care provider, or the department that is doing the test, when your results will be ready. Keep all follow-up visits. This is important. Summary An echocardiogram is a test that uses sound waves (ultrasound) to produce images of the heart. Images from an echocardiogram can provide important information about the  size and shape of your heart, heart muscle function, heart valve function, and other possible heart problems. You do not need to do anything to prepare before this test. You may eat and drink normally. After the echocardiogram is completed, you may return to your normal, everyday life, unless your health care provider tells you not to do that. This information is not intended to replace advice given to you by your health care provider. Make sure you discuss any questions you have with your health care provider. Document Revised: 09/07/2019 Document Reviewed: 09/07/2019 Elsevier Patient Education  2021 [employer].  Exercise Stress Echocardiogram An exercise stress echocardiogram is a test to check how well your heart is working. This test uses sound waves and a computer to make pictures of your heart. These pictures will be taken before and after you exercise. For this test, you will walk on a treadmill or ride a bicycle to  make your heart beat faster. While you exercise, your heart will be checked with an electrocardiogram (ECG). Your blood pressure will also be checked. You may have this test if: You have chest pain or a heart problem. You had a heart attack or heart surgery not long ago. You have heart valve problems. You have a condition that causes narrowing of the blood vessels that supply your heart. You have a high risk of heart disease and: You are starting a new exercise program. You need to have a big surgery. Tell a doctor about: Any allergies you have. All medicines you are taking. This includes vitamins, herbs, eye drops, creams, and over-the-counter medicines. Any problems you or family members have had with medicines that make you fall asleep (anesthetic medicines). Any surgeries you have had. Any blood disorders you have. Any medical conditions you have. Whether you are pregnant or may be pregnant. What are the risks? Generally, this is a safe test. However, problems may  occur, including: Chest pain. Feeling dizzy or light-headed. Shortness of breath. Increased or irregular heartbeat. Feeling like you may vomit (nausea) or vomiting. Heart attack. This is very rare. What happens before the test? Medicines Ask your doctor about changing or stopping your normal medicines. This is important if you take diabetes medicines or blood thinners. If you use an inhaler, bring it to the test. General instructions Wear comfortable clothes and walking shoes. Follow instructions from your doctor about what you cannot eat or drink before the test. Do not drink or eat anything that has caffeine in it. Stop having caffeine 24 hours before the test. Do not smoke or use products that contain nicotine or tobacco for 4 hours before the test. If you need help quitting, ask your doctor. What happens during the test?  You will take off your clothes from the waist up and put on a hospital gown. Electrodes or patches will be put on your chest. A blood pressure cuff will be put on your arm. Before you exercise, a computer will make a picture of your heart. To do this: You will lie down and a gel will be put on your chest. A wand will be moved over the gel. Sound waves from the wand will go to the computer to make the picture. Then, you will start to exercise. You may walk on a treadmill or pedal a bicycle. Your blood pressure and heart rhythm will be checked while you exercise. The exercise will get harder or faster. You will exercise until: Your heart reaches a certain level. You are too tired to go on. You cannot go on because of chest pain, weakness, or dizziness. You will lie down right away so another picture of your heart can be taken. The procedure may vary among doctors and hospitals. What can I expect after the test? After your test, it is common to have: Mild soreness. Mild tiredness. Your heart rate and blood pressure will be checked until they return to your  normal levels. You should not have any new symptoms after this test. Follow these instructions at home: If your doctor says that you can, you may: Eat what you normally eat. Do your normal activities. Take over-the-counter and prescription medicines only as told by your doctor. Keep all follow-up visits. It is up to you to get the results of your test. Ask how to get your results when they are ready. Contact a doctor if: You feel dizzy or light-headed. You have a fast or irregular  heartbeat. You feel like you may vomit or you vomit. You have a headache. You feel short of breath. Get help right away if: You develop pain or pressure: In your chest. In your jaw or neck. Between your shoulders. That goes down your left arm. You faint. You have trouble breathing. These symptoms may be an emergency. Get medical help right away. Call your local emergency services (911 in the [place] Do not wait to see if the symptoms will go away. Do not drive yourself to the hospital. Summary This is a test that checks how well your heart is working. Follow instructions about what you cannot eat or drink before the test. Ask your doctor if you should take your normal medicines before the test. Stop having caffeine 24 hours before the test. Do not smoke or use products with nicotine or tobacco in them for 4 hours before the test. During the test, your blood pressure and heart rhythm will be checked while you exercise. This information is not intended to replace advice given to you by your health care provider. Make sure you discuss any questions you have with your health care provider. Document Revised: 09/27/2020 Document Reviewed: 09/07/2019 Elsevier Patient Education  2022 [employer].

## 2020-12-02 LAB — BASIC METABOLIC PANEL
BUN/Creatinine Ratio: 10 (ref 9–20)
BUN: 9 mg/dL (ref 6–20)
CO2: 23 mmol/L (ref 20–29)
Calcium: 9.8 mg/dL (ref 8.7–10.2)
Chloride: 102 mmol/L (ref 96–106)
Creatinine, Ser: 0.89 mg/dL (ref 0.76–1.27)
Glucose: 105 mg/dL — ABNORMAL HIGH (ref 70–99)
Potassium: 4 mmol/L (ref 3.5–5.2)
Sodium: 141 mmol/L (ref 134–144)
eGFR: 115 mL/min/{1.73_m2} (ref >59)

## 2020-12-02 LAB — HEPATIC FUNCTION PANEL
ALT: 122 IU/L — ABNORMAL HIGH (ref 0–44)
AST: 57 IU/L — ABNORMAL HIGH (ref 0–40)
Albumin: 4.6 g/dL (ref 4.0–5.0)
Alkaline Phosphatase: 85 IU/L (ref 44–121)
Bilirubin Total: 0.6 mg/dL (ref 0.0–1.2)
Bilirubin, Direct: 0.17 mg/dL (ref 0.00–0.40)
Total Protein: 6.9 g/dL (ref 6.0–8.5)

## 2020-12-04 ENCOUNTER — Telehealth: Payer: Self-pay

## 2020-12-04 NOTE — Telephone Encounter (Signed)
Spoke with patient regarding results and recommendation.  Patient verbalizes understanding and is agreeable to plan of care. Advised patient to call back with any issues or concerns.  

## 2020-12-04 NOTE — Telephone Encounter (Signed)
-----   Message from Jenean Lindau, MD sent at 12/03/2020 11:51 PM EST ----- Do not take statin. Tell  him to talk to pcp about LFT's. [facility] pcp Jenean Lindau, MD 12/03/2020 11:51 PM

## 2020-12-04 NOTE — Telephone Encounter (Signed)
Patient returning call. He says if he does not answer to leave a detailed voicemail with the results.

## 2020-12-04 NOTE — Telephone Encounter (Signed)
Left message on patients voicemail to please return our call.   

## 2020-12-04 NOTE — Addendum Note (Signed)
Addended by: Resa Miner I on: 12/04/2020 11:11 AM   Modules accepted: Orders

## 2020-12-06 ENCOUNTER — Other Ambulatory Visit (HOSPITAL_COMMUNITY): Payer: Self-pay

## 2020-12-07 ENCOUNTER — Other Ambulatory Visit (HOSPITAL_COMMUNITY): Payer: Self-pay

## 2020-12-08 ENCOUNTER — Other Ambulatory Visit (HOSPITAL_COMMUNITY): Payer: Self-pay

## 2020-12-08 ENCOUNTER — Other Ambulatory Visit: Payer: Self-pay

## 2020-12-08 ENCOUNTER — Telehealth (INDEPENDENT_AMBULATORY_CARE_PROVIDER_SITE_OTHER): Payer: No Typology Code available for payment source | Admitting: Family Medicine

## 2020-12-08 DIAGNOSIS — E669 Obesity, unspecified: Secondary | ICD-10-CM

## 2020-12-08 DIAGNOSIS — R109 Unspecified abdominal pain: Secondary | ICD-10-CM

## 2020-12-08 DIAGNOSIS — I10 Essential (primary) hypertension: Secondary | ICD-10-CM | POA: Diagnosis not present

## 2020-12-08 DIAGNOSIS — R7989 Other specified abnormal findings of blood chemistry: Secondary | ICD-10-CM | POA: Diagnosis not present

## 2020-12-08 DIAGNOSIS — R194 Change in bowel habit: Secondary | ICD-10-CM | POA: Diagnosis not present

## 2020-12-08 DIAGNOSIS — F419 Anxiety disorder, unspecified: Secondary | ICD-10-CM

## 2020-12-08 DIAGNOSIS — F32A Depression, unspecified: Secondary | ICD-10-CM

## 2020-12-08 MED ORDER — WEGOVY 0.25 MG/0.5ML ~~LOC~~ SOAJ
0.2500 mg | SUBCUTANEOUS | 0 refills | Status: DC
  Filled 2020-12-08: qty 2, fill #0

## 2020-12-08 NOTE — Assessment & Plan Note (Signed)
Monitor and report any concerns, no changes to meds. Encouraged heart healthy diet such as the DASH diet and exercise as tolerated.  ?

## 2020-12-08 NOTE — Assessment & Plan Note (Signed)
Is doing better with job change.

## 2020-12-08 NOTE — Assessment & Plan Note (Signed)
He agrees to start Wegovy 0.25 mg weekly x 4 weeks and then will increase as needed and as tolerated.

## 2020-12-08 NOTE — Progress Notes (Signed)
MyChart Video Visit    Virtual Visit via Video Note   This visit type was conducted due to national recommendations for restrictions regarding the COVID-19 Pandemic (e.g. social distancing) in an effort to limit this patient's exposure and mitigate transmission in our community. This patient is at least at moderate risk for complications without adequate follow up. This format is felt to be most appropriate for this patient at this time. Physical exam was limited by quality of the video and audio technology used for the visit. S Chism, CMA was able to get the patient set up on a video visit.  Patient location: home Patient and provider in visit Provider location: Office  I discussed the limitations of evaluation and management by telemedicine and the availability of in person appointments. The patient expressed understanding and agreed to proceed.  Visit Date: 12/08/2020  Today's healthcare provider: Penni Homans, MD     Subjective:    Patient ID: Nicholas Robertson, male    DOB: 1985-05-31, 35 y.o.   MRN: [id number]  No chief complaint on file.   HPI Patient is in today for evaluation of elevated liver functions and intermittent chest pain. He is now established with cardiology and is awaiting further work up. He has episodes of Chest pressure without associated symptoms. Unlikely gastrointestinal after endoscopy and he has not had any symptoms of dyspepsia noted. No SOB that is unusual or new palpitations. During the cardiac work up they became worried about his liver functions being up. The patient does note occasionally abdominal discomfort but does not have any now. No c/o bloody or tarry stool. He is enjoying his new job and his stress is manageable. Denies palp/SOB/HA/congestion/fevers or GU c/o. Taking meds as prescribed   Past Medical History:  Diagnosis Date   Abnormal LFTs     Anal fissure    Anxiety and depression 04/18/2013   Chest tightness 10/30/2020   Essential  hypertension 02/07/2013   Family history of adverse reaction to anesthesia    father is hard to wake after anesthesia   Fever, unspecified 07/31/2013   Gastroesophageal reflux disease 04/20/2020   Hyperlipidemia 02/07/2013   Ingrown toenail of both feet 07/05/2018   Kidney stones    Neck pain 08/02/2019   Obesity (BMI 30-39.9) 08/02/2019   Preventative health care 04/18/2013   Seborrhea 03/08/2019   Vitamin B12 deficiency 09/12/2016   Vitamin D deficiency 03/08/2019   Wolff-Parkinson-White (WPW) syndrome 02/17/2013   S/p ablation.    Past Surgical History:  Procedure Laterality Date   ABLATION  03/09/2013   EPS and RFCA of manifest right posteroloateral accessory pathway   EXTRACORPOREAL SHOCK WAVE LITHOTRIPSY Left 11/08/2019   Procedure: EXTRACORPOREAL SHOCK WAVE LITHOTRIPSY (ESWL);  Surgeon: [name], MD;  Location: [facility];  Service: Urology;  Laterality: Left;   [facility] [place]   SIGMOIDOSCOPY     SUPRAVENTRICULAR TACHYCARDIA ABLATION N/A 03/08/2013   Procedure: WPW ABLATION;  Surgeon: [name], MD;  Location: [facility] CATH LAB;  Service: Cardiovascular;  Laterality: N/A;    Family History  Problem Relation Age of Onset   Hypertension Mother    Hyperlipidemia Mother    Mental illness Mother        anxiety   Other Mother        glucose intolerant   Colon polyps Mother    Diabetes Father        2  Hypertension Father    Arthritis Father    Colon polyps Father    Psoriasis Maternal Grandmother    Hypertension Maternal Grandmother    Cancer Maternal Grandmother        lymphoma   Hyperlipidemia Maternal Grandfather    Heart disease Maternal Grandfather        cabg   Hypertension Maternal Grandfather    Polymyalgia rheumatica Maternal Grandfather    Hypertension Paternal Grandmother    Diabetes Paternal Grandmother        2   COPD Paternal Grandmother    Heart disease Paternal Grandfather         s/p MI, s/p CABG   Hyperlipidemia Paternal Grandfather    Hypertension Paternal Grandfather    Diabetes Paternal Grandfather        1   Colon cancer Neg Hx    Esophageal cancer Neg Hx    Rectal cancer Neg Hx    Stomach cancer Neg Hx     Social History   Socioeconomic History   Marital status: Married    Spouse name: Not on file   Number of children: Not on file   Years of education: Not on file   Highest education level: Not on file  Occupational History   Not on file  Tobacco Use   Smoking status: Never   Smokeless tobacco: Never  Vaping Use   Vaping Use: Never used  Substance and Sexual Activity   Alcohol use: No   Drug use: No   Sexual activity: Yes    Comment: no dietary restricitons, lives alone with cat, seat belts  Other Topics Concern   Not on file  Social History Narrative   Works at [employer], as [place] with partner   No major dietary restrictions.    Pet: cat, dog   Social Determinants of Health   Financial Resource Strain: Not on file  Food Insecurity: Not on file  Transportation Needs: Not on file  Physical Activity: Not on file  Stress: Not on file  Social Connections: Not on file  Intimate Partner Violence: Not on file    Outpatient Medications Prior to Visit  Medication Sig Dispense Refill   albuterol (VENTOLIN HFA) 108 (90 Base) MCG/ACT inhaler Inhale 2 puffs into the lungs every 6 (six) hours as needed for wheezing or shortness of breath. 18 g 2   amLODipine (NORVASC) 5 MG tablet TAKE 1 TABLET BY MOUTH ONCE A DAY 90 tablet 3   cyanocobalamin 1000 MCG tablet Take 1,000 mcg by mouth daily.     famotidine (PEPCID) 40 MG tablet TAKE 1 TABLET BY MOUTH ONCE A DAY 90 tablet 1   metoprolol succinate (TOPROL-XL) 50 MG 24 hr tablet TAKE 1 TABLET BY MOUTH DAILY. TAKE WITH OR IMMEDIATELY FOLLOWING A MEAL 90 tablet 3   olmesartan-hydrochlorothiazide (BENICAR HCT) 40-12.5 MG tablet Take 1 tablet by mouth daily. 90 tablet 1   pantoprazole  (PROTONIX) 40 MG tablet TAKE 1 TABLET (40 MG TOTAL) BY MOUTH DAILY. 90 tablet 1   vortioxetine HBr (TRINTELLIX) 20 MG TABS tablet TAKE 1 TABLET BY MOUTH ONCE A DAY 90 tablet 1   No facility-administered medications prior to visit.    No Known Allergies  Review of Systems  Constitutional:  Positive for malaise/fatigue. Negative for fever.  HENT:  Negative for congestion.   Eyes:  Negative for blurred vision.  Respiratory:  Negative for shortness of breath.   Cardiovascular:  Negative for chest pain, palpitations  and leg swelling.  Gastrointestinal:  Negative for abdominal pain, blood in stool and nausea.  Genitourinary:  Negative for dysuria and frequency.  Musculoskeletal:  Negative for falls.  Skin:  Negative for rash.  Neurological:  Negative for dizziness, loss of consciousness and headaches.  Endo/Heme/Allergies:  Negative for environmental allergies.  Psychiatric/Behavioral:  Negative for depression. The patient is not nervous/anxious.       Objective:    Physical Exam Constitutional:      General: He is not in acute distress.    Appearance: Normal appearance. He is not ill-appearing or toxic-appearing.  HENT:     Head: Normocephalic and atraumatic.     Right Ear: External ear normal.     Left Ear: External ear normal.     Nose: Nose normal.  Eyes:     General:        Right eye: No discharge.        Left eye: No discharge.  Pulmonary:     Effort: Pulmonary effort is normal.  Skin:    Findings: No rash.  Neurological:     Mental Status: He is alert and oriented to person, place, and time.  Psychiatric:        Behavior: Behavior normal.    There were no vitals taken for this visit. Wt Readings from Last 3 Encounters:  12/01/20 262 lb 1.3 oz (118.9 kg)  10/30/20 261 lb 9.6 oz (118.7 kg)  10/16/20 261 lb 6.4 oz (118.6 kg)    Diabetic Foot Exam - Simple   No data filed    Lab Results  Component Value Date   WBC 7.4 03/28/2020   HGB 16.2 03/28/2020   HCT  45.6 03/28/2020   PLT 211.0 03/28/2020   GLUCOSE 105 (H) 12/01/2020   CHOL 198 03/28/2020   TRIG 188.0 (H) 03/28/2020   HDL 45.60 03/28/2020   LDLDIRECT 168.0 08/02/2014   LDLCALC 114 (H) 03/28/2020   ALT 122 (H) 12/01/2020   AST 57 (H) 12/01/2020   NA 141 12/01/2020   K 4.0 12/01/2020   CL 102 12/01/2020   CREATININE 0.89 12/01/2020   BUN 9 12/01/2020   CO2 23 12/01/2020   TSH 2.70 03/28/2020   HGBA1C 6.4 03/29/2020    Lab Results  Component Value Date   TSH 2.70 03/28/2020   Lab Results  Component Value Date   WBC 7.4 03/28/2020   HGB 16.2 03/28/2020   HCT 45.6 03/28/2020   MCV 91.2 03/28/2020   PLT 211.0 03/28/2020   Lab Results  Component Value Date   NA 141 12/01/2020   K 4.0 12/01/2020   CO2 23 12/01/2020   GLUCOSE 105 (H) 12/01/2020   BUN 9 12/01/2020   CREATININE 0.89 12/01/2020   BILITOT 0.6 12/01/2020   ALKPHOS 85 12/01/2020   AST 57 (H) 12/01/2020   ALT 122 (H) 12/01/2020   PROT 6.9 12/01/2020   ALBUMIN 4.6 12/01/2020   CALCIUM 9.8 12/01/2020   ANIONGAP 10 03/10/2017   EGFR 115 12/01/2020   GFR 100.53 04/20/2020   Lab Results  Component Value Date   CHOL 198 03/28/2020   Lab Results  Component Value Date   HDL 45.60 03/28/2020   Lab Results  Component Value Date   LDLCALC 114 (H) 03/28/2020   Lab Results  Component Value Date   TRIG 188.0 (H) 03/28/2020   Lab Results  Component Value Date   CHOLHDL 4 03/28/2020   Lab Results  Component Value Date   HGBA1C 6.4  03/29/2020       Assessment & Plan:   Problem List Items Addressed This Visit     Essential hypertension    Monitor and report any concerns, no changes to meds. Encouraged heart healthy diet such as the DASH diet and exercise as tolerated.       Abnormal LFTs    Numbers remain high and he has had occasional abdominal pain and a change in bowel habits so will proceed with abdominal ultrasound to reassess. Minimize simple carbs and processed foods      Anxiety and  depression    Is doing better with job change.       Obesity (BMI 35.0-39.9 without comorbidity)    He agrees to start Wegovy 0.25 mg weekly x 4 weeks and then will increase as needed and as tolerated.       Relevant Medications   Semaglutide-Weight Management (WEGOVY) 0.25 MG/0.5ML SOAJ   Other Visit Diagnoses     Elevated liver function tests    -  Primary   Relevant Orders   US Abdomen Complete   Bowel habit changes       Relevant Orders   US Abdomen Complete   Abdominal pain, unspecified abdominal location       Relevant Orders   US Abdomen Complete       I am having Nicholas Robertson start on Wegovy. I am also having him maintain his famotidine, amLODipine, cyanocobalamin, albuterol, olmesartan-hydrochlorothiazide, pantoprazole, vortioxetine HBr, and metoprolol succinate.  Meds ordered this encounter  Medications   Semaglutide-Weight Management (WEGOVY) 0.25 MG/0.5ML SOAJ    Sig: Inject 0.25 mg into the skin once a week.    Dispense:  2 mL    Refill:  0    I discussed the assessment and treatment plan with the patient. The patient was provided an opportunity to ask questions and all were answered. The patient agreed with the plan and demonstrated an understanding of the instructions.   The patient was advised to call back or seek an in-person evaluation if the symptoms worsen or if the condition fails to improve as anticipated.  I provided 25 minutes of face-to-face time during this encounter.   Penni Homans, MD [facility] [phone] (phone) [phone] (fax)  [place]

## 2020-12-08 NOTE — Assessment & Plan Note (Signed)
Numbers remain high and he has had occasional abdominal pain and a change in bowel habits so will proceed with abdominal ultrasound to reassess. Minimize simple carbs and processed foods

## 2020-12-09 ENCOUNTER — Encounter: Payer: Self-pay | Admitting: Family Medicine

## 2020-12-12 ENCOUNTER — Other Ambulatory Visit (HOSPITAL_COMMUNITY): Payer: Self-pay

## 2020-12-12 ENCOUNTER — Other Ambulatory Visit: Payer: Self-pay | Admitting: Family Medicine

## 2020-12-12 MED ORDER — TIRZEPATIDE 2.5 MG/0.5ML ~~LOC~~ SOAJ
2.5000 mg | SUBCUTANEOUS | 0 refills | Status: DC
  Filled 2020-12-12: qty 2, 28d supply, fill #0

## 2020-12-13 ENCOUNTER — Other Ambulatory Visit (HOSPITAL_COMMUNITY): Payer: Self-pay

## 2020-12-14 ENCOUNTER — Telehealth: Payer: No Typology Code available for payment source | Admitting: Family Medicine

## 2020-12-18 ENCOUNTER — Encounter: Payer: No Typology Code available for payment source | Admitting: Family Medicine

## 2020-12-19 ENCOUNTER — Ambulatory Visit (HOSPITAL_BASED_OUTPATIENT_CLINIC_OR_DEPARTMENT_OTHER)
Admission: RE | Admit: 2020-12-19 | Discharge: 2020-12-19 | Disposition: A | Discharge status: Home / Self Care | Payer: No Typology Code available for payment source | Source: Ambulatory Visit | Attending: Family Medicine | Admitting: Family Medicine

## 2020-12-19 ENCOUNTER — Other Ambulatory Visit: Payer: Self-pay

## 2020-12-19 DIAGNOSIS — R7989 Other specified abnormal findings of blood chemistry: Secondary | ICD-10-CM | POA: Diagnosis not present

## 2020-12-19 DIAGNOSIS — R109 Unspecified abdominal pain: Secondary | ICD-10-CM | POA: Insufficient documentation

## 2020-12-19 DIAGNOSIS — R194 Change in bowel habit: Secondary | ICD-10-CM | POA: Insufficient documentation

## 2020-12-25 ENCOUNTER — Other Ambulatory Visit (HOSPITAL_COMMUNITY): Payer: Self-pay

## 2020-12-25 ENCOUNTER — Telehealth (HOSPITAL_COMMUNITY): Payer: Self-pay | Admitting: *Deleted

## 2020-12-25 MED FILL — Amlodipine Besylate Tab 5 MG (Base Equivalent): ORAL | 90 days supply | Qty: 90 | Fill #2 | Status: AC

## 2020-12-25 NOTE — Telephone Encounter (Signed)
Patient given instructions for upcoming stress echo.  Nicholas Robertson  

## 2020-12-29 ENCOUNTER — Encounter: Payer: Self-pay | Admitting: Family Medicine

## 2020-12-29 ENCOUNTER — Ambulatory Visit (INDEPENDENT_AMBULATORY_CARE_PROVIDER_SITE_OTHER): Payer: No Typology Code available for payment source | Admitting: Family Medicine

## 2020-12-29 VITALS — BP 132/100 | HR 86 | Temp 98.4°F | Resp 16 | Ht 70.0 in | Wt 255.4 lb

## 2020-12-29 DIAGNOSIS — J329 Chronic sinusitis, unspecified: Secondary | ICD-10-CM

## 2020-12-29 DIAGNOSIS — B9689 Other specified bacterial agents as the cause of diseases classified elsewhere: Secondary | ICD-10-CM

## 2020-12-29 DIAGNOSIS — Z114 Encounter for screening for human immunodeficiency virus [HIV]: Secondary | ICD-10-CM

## 2020-12-29 DIAGNOSIS — Z0000 Encounter for general adult medical examination without abnormal findings: Secondary | ICD-10-CM

## 2020-12-29 DIAGNOSIS — E559 Vitamin D deficiency, unspecified: Secondary | ICD-10-CM

## 2020-12-29 DIAGNOSIS — Z0001 Encounter for general adult medical examination with abnormal findings: Secondary | ICD-10-CM

## 2020-12-29 DIAGNOSIS — E7439 Other disorders of intestinal carbohydrate absorption: Secondary | ICD-10-CM

## 2020-12-29 DIAGNOSIS — E538 Deficiency of other specified B group vitamins: Secondary | ICD-10-CM

## 2020-12-29 DIAGNOSIS — Z1159 Encounter for screening for other viral diseases: Secondary | ICD-10-CM

## 2020-12-29 DIAGNOSIS — I10 Essential (primary) hypertension: Secondary | ICD-10-CM

## 2020-12-29 MED ORDER — AMOXICILLIN 875 MG PO TABS: 875.0000 mg | ORAL_TABLET | Freq: Two times a day (BID) | ORAL | 0 refills | Status: AC

## 2020-12-29 NOTE — Assessment & Plan Note (Signed)
Check labs and replete prn. 

## 2020-12-29 NOTE — Progress Notes (Signed)
   Subjective:    Patient ID: Nicholas Robertson, male    DOB: 1985-09-18, 34 y.o.   MRN: [id number]  HPI CPE- UTD on Tdap, flu, COVID.  BP is elevated today but currently on decongestants for viral URI.    Review of Systems Patient reports no vision/hearing changes, anorexia, fever ,adenopathy, persistant/recurrent hoarseness, swallowing issues, chest pain, palpitations, edema, persistant/recurrent cough, hemoptysis, gastrointestinal  bleeding (melena, rectal bleeding), abdominal pain, excessive heart burn, GU symptoms (dysuria, hematuria, voiding/incontinence issues) syncope, focal weakness, memory loss, numbness, skin/hair/nail changes, depression, anxiety, abnormal bruising/bleeding, musculoskeletal symptoms/signs.   + ongoing SOB + occasional tingling of feet  URI- sxs started Sunday w/ scratchy throat.  Then developed body aches, chills, fever (Tm 101.4).  Aches and chills resolved within 24 hrs.  Congestion has thickened, some tooth pain, R sided frontal and maxillary pressure.  Continues to have coughing jags- productive and wet.  This visit occurred during the SARS-CoV-2 public health emergency.  Safety protocols were in place, including screening questions prior to the visit, additional usage of staff PPE, and extensive cleaning of exam room while observing appropriate contact time as indicated for disinfecting solutions.      Objective:   Physical Exam General Appearance:    Alert, cooperative, no distress, appears stated age  Head:    Normocephalic, without obvious abnormality, atraumatic  Eyes:    PERRL, conjunctiva/corneas clear, EOM's intact, fundi    benign, both eyes       Ears:    Normal TM's and external ear canals, both ears  Nose:   Nares normal, septum midline, mucosa normal, + congestion, mild sinus TTP  Throat:     Neck:   Supple, symmetrical, trachea midline, no adenopathy;       thyroid:  No enlargement/tenderness/nodules  Back:     Symmetric, no curvature, ROM  normal, no CVA tenderness  Lungs:     Clear to auscultation bilaterally, respirations unlabored.  Wet, hacking cough  Chest wall:    No tenderness or deformity  Heart:    Regular rate and rhythm, S1 and S2 normal, no murmur, rub   or gallop  Abdomen:     Soft, non-tender, bowel sounds active all four quadrants,    no masses, no organomegaly  Genitalia:    deferred  Rectal:    Extremities:   Extremities normal, atraumatic, no cyanosis or edema  Pulses:   2+ and symmetric all extremities  Skin:   Skin color, texture, turgor normal, no rashes or lesions  Lymph nodes:   Cervical, supraclavicular, and axillary nodes normal  Neurologic:   CNII-XII intact. Normal strength, sensation and reflexes      throughout          Assessment & Plan:  Bacterial sinusitis- new.  Pt had flu like illness earlier this week and is now developing increased congestion, facial pressure, tooth pain.  Given the possibility of secondary bacterial infxn, will give Amoxicillin for him to start should sxs worsen.  Pt expressed understanding and is in agreement w/ plan.

## 2020-12-29 NOTE — Assessment & Plan Note (Signed)
Deteriorated.  Pt admits to increased decongestant use this week due to his respiratory illness.  Encouraged him to decrease his use, increase his water intake, take his daily medication, and monitor his BP at home.  If BP is still elevated after illness clears, will need to adjust meds.  Pt expressed understanding and is in agreement w/ plan.

## 2020-12-29 NOTE — Patient Instructions (Addendum)
Follow up as needed or as scheduled Go to [facility] to get your labs done START the Amoxicillin if sinus sxs worsen USE the inhaler when you start coughing Continue to work on healthy diet and regular exercise- you can do it! Call with any questions or concerns Happy Holidays!!!!

## 2020-12-29 NOTE — Assessment & Plan Note (Signed)
Pt's PE WNL w/ exception of obesity, elevated BP, and sinusitis.  UTD on immunizations.  Check labs.  Anticipatory guidance provided.

## 2021-01-01 ENCOUNTER — Other Ambulatory Visit (INDEPENDENT_AMBULATORY_CARE_PROVIDER_SITE_OTHER): Payer: No Typology Code available for payment source

## 2021-01-01 ENCOUNTER — Ambulatory Visit (HOSPITAL_COMMUNITY): Payer: No Typology Code available for payment source

## 2021-01-01 ENCOUNTER — Ambulatory Visit
Admission: RE | Admit: 2021-01-01 | Discharge: 2021-01-01 | Disposition: A | Discharge status: Home / Self Care | Payer: Self-pay | Source: Ambulatory Visit | Attending: Cardiology | Admitting: Cardiology

## 2021-01-01 ENCOUNTER — Other Ambulatory Visit: Payer: Self-pay

## 2021-01-01 ENCOUNTER — Ambulatory Visit (HOSPITAL_BASED_OUTPATIENT_CLINIC_OR_DEPARTMENT_OTHER): Payer: No Typology Code available for payment source

## 2021-01-01 ENCOUNTER — Ambulatory Visit (HOSPITAL_COMMUNITY): Payer: No Typology Code available for payment source | Attending: Cardiology

## 2021-01-01 DIAGNOSIS — R079 Chest pain, unspecified: Secondary | ICD-10-CM | POA: Diagnosis not present

## 2021-01-01 DIAGNOSIS — E782 Mixed hyperlipidemia: Secondary | ICD-10-CM

## 2021-01-01 DIAGNOSIS — I10 Essential (primary) hypertension: Secondary | ICD-10-CM

## 2021-01-01 DIAGNOSIS — E538 Deficiency of other specified B group vitamins: Secondary | ICD-10-CM | POA: Diagnosis not present

## 2021-01-01 DIAGNOSIS — Z114 Encounter for screening for human immunodeficiency virus [HIV]: Secondary | ICD-10-CM

## 2021-01-01 DIAGNOSIS — R011 Cardiac murmur, unspecified: Secondary | ICD-10-CM | POA: Diagnosis not present

## 2021-01-01 DIAGNOSIS — Z1159 Encounter for screening for other viral diseases: Secondary | ICD-10-CM

## 2021-01-01 DIAGNOSIS — E559 Vitamin D deficiency, unspecified: Secondary | ICD-10-CM

## 2021-01-01 LAB — LIPID PANEL
Cholesterol: 246 mg/dL — ABNORMAL HIGH (ref 0–200)
HDL: 43.8 mg/dL (ref >39.00)
LDL Cholesterol: 171 mg/dL — ABNORMAL HIGH (ref 0–99)
NonHDL: 202.64
Total CHOL/HDL Ratio: 6
Triglycerides: 156 mg/dL — ABNORMAL HIGH (ref 0.0–149.0)
VLDL: 31.2 mg/dL (ref 0.0–40.0)

## 2021-01-01 LAB — TSH: TSH: 1.65 u[IU]/mL (ref 0.35–5.50)

## 2021-01-01 LAB — CBC WITH DIFFERENTIAL/PLATELET
Basophils Absolute: 0.1 10*3/uL (ref 0.0–0.1)
Basophils Relative: 0.9 % (ref 0.0–3.0)
Eosinophils Absolute: 0.1 10*3/uL (ref 0.0–0.7)
Eosinophils Relative: 2.3 % (ref 0.0–5.0)
HCT: 45.2 % (ref 39.0–52.0)
Hemoglobin: 15.5 g/dL (ref 13.0–17.0)
Lymphocytes Relative: 43.1 % (ref 12.0–46.0)
Lymphs Abs: 2.8 10*3/uL (ref 0.7–4.0)
MCHC: 34.3 g/dL (ref 30.0–36.0)
MCV: 90.8 fl (ref 78.0–100.0)
Monocytes Absolute: 0.6 10*3/uL (ref 0.1–1.0)
Monocytes Relative: 9.7 % (ref 3.0–12.0)
Neutro Abs: 2.8 10*3/uL (ref 1.4–7.7)
Neutrophils Relative %: 44 % (ref 43.0–77.0)
Platelets: 220 10*3/uL (ref 150.0–400.0)
RBC: 4.98 Mil/uL (ref 4.22–5.81)
RDW: 12.9 % (ref 11.5–15.5)
WBC: 6.4 10*3/uL (ref 4.0–10.5)

## 2021-01-01 LAB — B12 AND FOLATE PANEL
Folate: 10.4 ng/mL (ref >5.9)
Vitamin B-12: 442 pg/mL (ref 211–911)

## 2021-01-01 LAB — BASIC METABOLIC PANEL
BUN: 12 mg/dL (ref 6–23)
CO2: 29 mEq/L (ref 19–32)
Calcium: 9.5 mg/dL (ref 8.4–10.5)
Chloride: 101 mEq/L (ref 96–112)
Creatinine, Ser: 0.78 mg/dL (ref 0.40–1.50)
GFR: 115.69 mL/min (ref >60.00)
Glucose, Bld: 90 mg/dL (ref 70–99)
Potassium: 3.5 mEq/L (ref 3.5–5.1)
Sodium: 140 mEq/L (ref 135–145)

## 2021-01-01 LAB — HEPATIC FUNCTION PANEL
ALT: 83 U/L — ABNORMAL HIGH (ref 0–53)
AST: 44 U/L — ABNORMAL HIGH (ref 0–37)
Albumin: 4.3 g/dL (ref 3.5–5.2)
Alkaline Phosphatase: 65 U/L (ref 39–117)
Bilirubin, Direct: 0.1 mg/dL (ref 0.0–0.3)
Total Bilirubin: 0.6 mg/dL (ref 0.2–1.2)
Total Protein: 7 g/dL (ref 6.0–8.3)

## 2021-01-01 LAB — ECHOCARDIOGRAM COMPLETE
Area-P 1/2: 3.5 cm2
S' Lateral: 2.5 cm

## 2021-01-01 LAB — HEMOGLOBIN A1C: Hgb A1c MFr Bld: 6.2 % (ref 4.6–6.5)

## 2021-01-01 LAB — VITAMIN D 25 HYDROXY (VIT D DEFICIENCY, FRACTURES): VITD: 15.55 ng/mL — ABNORMAL LOW (ref 30.00–100.00)

## 2021-01-01 MED ORDER — PERFLUTREN LIPID MICROSPHERE
1.0000 mL | INTRAVENOUS | Status: AC | PRN
  Administered 2021-01-01: 3 mL via INTRAVENOUS
  Administered 2021-01-01 (×3): 2 mL via INTRAVENOUS

## 2021-01-01 NOTE — Addendum Note (Signed)
Addended by: Midge Minium on: 01/01/2021 08:35 AM   Modules accepted: Orders

## 2021-01-02 ENCOUNTER — Encounter: Payer: Self-pay | Admitting: Family Medicine

## 2021-01-02 LAB — HEPATITIS C ANTIBODY
Hepatitis C Ab: NONREACTIVE
SIGNAL TO CUT-OFF: 0.03 (ref <1.00)

## 2021-01-02 LAB — HIV ANTIBODY (ROUTINE TESTING W REFLEX): HIV 1&2 Ab, 4th Generation: NONREACTIVE

## 2021-01-18 ENCOUNTER — Other Ambulatory Visit (HOSPITAL_COMMUNITY): Payer: Self-pay

## 2021-02-12 ENCOUNTER — Encounter: Payer: Self-pay | Admitting: Family Medicine

## 2021-02-13 ENCOUNTER — Other Ambulatory Visit (HOSPITAL_COMMUNITY): Payer: Self-pay

## 2021-02-13 ENCOUNTER — Other Ambulatory Visit: Payer: Self-pay | Admitting: Family Medicine

## 2021-02-13 MED ORDER — WEGOVY 0.25 MG/0.5ML ~~LOC~~ SOAJ
0.2500 mg | SUBCUTANEOUS | 0 refills | Status: DC
  Filled 2021-02-13: qty 2, 28d supply, fill #0

## 2021-02-14 ENCOUNTER — Other Ambulatory Visit (HOSPITAL_COMMUNITY): Payer: Self-pay

## 2021-02-20 ENCOUNTER — Encounter: Payer: Self-pay | Admitting: Family Medicine

## 2021-02-20 ENCOUNTER — Telehealth: Payer: Self-pay

## 2021-02-20 ENCOUNTER — Other Ambulatory Visit (HOSPITAL_COMMUNITY): Payer: Self-pay

## 2021-02-20 NOTE — Telephone Encounter (Signed)
PA initiated via Covermymeds; KEY: [id number]. Awaiting determination.

## 2021-02-21 NOTE — Telephone Encounter (Signed)
PA approved.   The request has been approved. The authorization is effective for a maximum of 3 fills from 02/21/2021 to 05/21/2021, as long as the member is enrolled in their current health plan. The request was approved as submitted. The request was approved for 55mL per 28 days.Additional prior authorizations (PA) have been entered HDI:XBOERQ 0.5mg /0.42mL allowing a maximum of 3 fills with a quantity limit of 45mL per 28 days (PA [id number])Wegovy 1mg /0.26mL allowing a maximum of 3 fills with a quantity limit of 25mL per 28 days (PA 5559)Wegovy 1.7mg /0.81mL allowing a maximum of 3 fills with a quantity limit of 76mL per 28 days (PA 5560)Wegovy 2.4mg /0.47mL allowing a maximum of 3 fills with a quantity limit of 29mL per 28 days (PA [id number])These authorizations are effective 02/21/2021 through 05/21/2021. A written notification letter will follow with additional details

## 2021-02-26 ENCOUNTER — Other Ambulatory Visit (HOSPITAL_COMMUNITY): Payer: Self-pay

## 2021-03-01 ENCOUNTER — Other Ambulatory Visit: Payer: Self-pay | Admitting: Family Medicine

## 2021-03-01 ENCOUNTER — Other Ambulatory Visit (HOSPITAL_COMMUNITY): Payer: Self-pay

## 2021-03-01 MED ORDER — FAMOTIDINE 40 MG PO TABS
ORAL_TABLET | Freq: Every day | ORAL | 1 refills | Status: DC
  Filled 2021-03-01: qty 90, 90d supply, fill #0
  Filled 2021-06-19: qty 90, 90d supply, fill #1

## 2021-03-13 ENCOUNTER — Other Ambulatory Visit: Payer: Self-pay

## 2021-03-14 ENCOUNTER — Other Ambulatory Visit: Payer: Self-pay | Admitting: Family Medicine

## 2021-03-15 ENCOUNTER — Other Ambulatory Visit (HOSPITAL_COMMUNITY): Payer: Self-pay

## 2021-03-15 MED ORDER — AMLODIPINE BESYLATE 5 MG PO TABS
ORAL_TABLET | Freq: Every day | ORAL | 3 refills | Status: DC
  Filled 2021-03-15: qty 90, 90d supply, fill #0
  Filled 2021-06-19: qty 90, 90d supply, fill #1
  Filled 2021-09-03: qty 90, 90d supply, fill #2
  Filled 2021-12-13: qty 90, 90d supply, fill #3

## 2021-03-16 ENCOUNTER — Other Ambulatory Visit: Payer: Self-pay

## 2021-03-16 ENCOUNTER — Encounter: Payer: Self-pay | Admitting: Cardiology

## 2021-03-16 ENCOUNTER — Ambulatory Visit (INDEPENDENT_AMBULATORY_CARE_PROVIDER_SITE_OTHER): Payer: No Typology Code available for payment source | Admitting: Cardiology

## 2021-03-16 VITALS — BP 142/90 | HR 72 | Ht 70.0 in | Wt 259.0 lb

## 2021-03-16 DIAGNOSIS — E669 Obesity, unspecified: Secondary | ICD-10-CM

## 2021-03-16 DIAGNOSIS — E782 Mixed hyperlipidemia: Secondary | ICD-10-CM | POA: Diagnosis not present

## 2021-03-16 DIAGNOSIS — I10 Essential (primary) hypertension: Secondary | ICD-10-CM | POA: Diagnosis not present

## 2021-03-16 DIAGNOSIS — R7989 Other specified abnormal findings of blood chemistry: Secondary | ICD-10-CM

## 2021-03-16 NOTE — Progress Notes (Signed)
Cardiology Office Note:    Date:  03/16/2021   ID:  Nicholas Robertson, DOB 03/06/1985, MRN [id number]  PCP:  Mosie Lukes, MD  Cardiologist:  Jenean Lindau, MD   Referring MD: Mosie Lukes, MD    ASSESSMENT:    1. Essential hypertension   2. Obesity (BMI 35.0-39.9 without comorbidity)   3. Mixed hyperlipidemia   4. Abnormal LFTs    PLAN:    In order of problems listed above:  Primary prevention stressed with the patient.  Importance of compliance with diet medication stressed any vocalized understanding.  He is happy to know that his calcium score is he was advised to walk at least half an hour a day 5 days a week and he promises to do so. Mixed dyslipidemia: Lipids are elevated and diet was emphasized.  His calcium score is 0 so I am not interested to initiate him on lipid-lowering medications.  He is going to exercise and be compliant with diet.  He will be back in follow-up in 3 months for lipid check. Obesity: Weight reduction stressed risks of obesity explained and he promises to do better. Elevated LFTs: Followed by primary care.  CT scan reveals hepatic steatosis. Essential hypertension: He mentions to me that his blood pressure stable at home and mentioned with the numbers.  He has an element of whitecoat hypertension.  Salt intake issues, diet and lifestyle modification urged. Patient will be seen in follow-up appointment in 6 months or earlier if the patient has any concerns    Medication Adjustments/Labs and Tests Ordered: Current medicines are reviewed at length with the patient today.  Concerns regarding medicines are outlined above.  No orders of the defined types were placed in this encounter.  No orders of the defined types were placed in this encounter.    No chief complaint on file.    History of Present Illness:    Nicholas Robertson is a 36 y.o. male.  Patient was evaluated for chest pain and dyslipidemia.  He denies any problems at this time and  takes care of activities of daily living.  No chest pain orthopnea or PND.  At the time of my evaluation, the patient is alert awake oriented and in no distress.Marland Kitchen  He is doing his best to exercise and lose weight.  His stress test and echocardiogram was unremarkable.  His calcium score is 0 and his markedly elevated lipids and elevated LFTs also.  These are evaluated by primary care.   Past Medical History:  Diagnosis Date   Abnormal LFTs     Anxiety and depression 04/18/2013   Cardiac murmur 12/01/2020   Chest pain 12/01/2020   Chest tightness 10/30/2020   Essential hypertension 02/07/2013   Family history of adverse reaction to anesthesia    father is hard to wake after anesthesia   Gastroesophageal reflux disease 04/20/2020   Hyperlipidemia 02/07/2013   Ingrown toenail of both feet 07/05/2018   Kidney stones    Neck pain 08/02/2019   Obesity (BMI 30-39.9) 08/02/2019   Obesity (BMI 35.0-39.9 without comorbidity) 12/01/2020   Preventative health care 04/18/2013   Seborrhea 03/08/2019   Vitamin B12 deficiency 09/12/2016   Vitamin D deficiency 03/08/2019   Wolff-Parkinson-White (WPW) syndrome 02/17/2013   S/p ablation.    Past Surgical History:  Procedure Laterality Date   ABLATION  03/09/2013   EPS and RFCA of manifest right posteroloateral accessory pathway   EXTRACORPOREAL SHOCK WAVE LITHOTRIPSY Left 11/08/2019   Procedure: EXTRACORPOREAL  SHOCK WAVE LITHOTRIPSY (ESWL);  Surgeon: [name], MD;  [facility];  Service: Urology;  Laterality: Left;   [place]   [facility] [place]   SIGMOIDOSCOPY     SUPRAVENTRICULAR TACHYCARDIA ABLATION N/A 03/08/2013   Procedure: WPW ABLATION;  Surgeon: [name], MD;  Location: [facility] CATH LAB;  Service: Cardiovascular;  Laterality: N/A;    Current Medications: Current Meds  Medication Sig   amLODipine (NORVASC) 5 MG tablet TAKE 1 TABLET BY MOUTH ONCE A DAY    cyanocobalamin 1000 MCG tablet Take 1,000 mcg by mouth daily.   famotidine (PEPCID) 40 MG tablet TAKE 1 TABLET BY MOUTH ONCE A DAY   metoprolol succinate (TOPROL-XL) 50 MG 24 hr tablet TAKE 1 TABLET BY MOUTH DAILY. TAKE WITH OR IMMEDIATELY FOLLOWING A MEAL   olmesartan-hydrochlorothiazide (BENICAR HCT) 40-12.5 MG tablet Take 1 tablet by mouth daily.   pantoprazole (PROTONIX) 40 MG tablet TAKE 1 TABLET (40 MG TOTAL) BY MOUTH DAILY.   Semaglutide-Weight Management (WEGOVY) 0.25 MG/0.5ML SOAJ Inject 0.25 mg into the skin once a week.   vortioxetine HBr (TRINTELLIX) 20 MG TABS tablet TAKE 1 TABLET BY MOUTH ONCE A DAY     Allergies:   Patient has no known allergies.   Social History   Socioeconomic History   Marital status: Married    Spouse name: Not on file   Number of children: Not on file   Years of education: Not on file   Highest education level: Not on file  Occupational History   Not on file  Tobacco Use   Smoking status: Never   Smokeless tobacco: Never  Vaping Use   Vaping Use: Never used  Substance and Sexual Activity   Alcohol use: No   Drug use: No   Sexual activity: Yes    Comment: no dietary restricitons, lives alone with cat, seat belts  Other Topics Concern   Not on file  Social History Narrative   Works at [employer], as [place] with partner   No major dietary restrictions.    Pet: cat, dog   Social Determinants of Radio broadcast assistant Strain: Not on file  Food Insecurity: Not on file  Transportation Needs: Not on file  Physical Activity: Not on file  Stress: Not on file  Social Connections: Not on file     Family History: The patient's family history includes Arthritis in his father; COPD in his paternal grandmother; Cancer in his maternal grandmother; Colon polyps in his father and mother; Diabetes in his father, paternal grandfather, and paternal grandmother; Heart disease in his maternal grandfather and paternal grandfather;  Hyperlipidemia in his maternal grandfather, mother, and paternal grandfather; Hypertension in his father, maternal grandfather, maternal grandmother, mother, paternal grandfather, and paternal grandmother; Mental illness in his mother; Other in his mother; Polymyalgia rheumatica in his maternal grandfather; Psoriasis in his maternal grandmother. There is no history of Colon cancer, Esophageal cancer, Rectal cancer, or Stomach cancer.  ROS:   Please see the history of present illness.    All other systems reviewed and are negative.  EKGs/Labs/Other Studies Reviewed:    The following studies were reviewed today: I discussed my findings with the patient at length   Recent Labs: 01/01/2021: ALT 83; BUN 12; Creatinine, Ser 0.78; Hemoglobin 15.5; Platelets 220.0; Potassium 3.5; Sodium 140; TSH 1.65  Recent Lipid Panel    Component Value Date/Time   CHOL 246 (H) 01/01/2021 1610  TRIG 156.0 (H) 01/01/2021 0949   HDL 43.80 01/01/2021 0949   CHOLHDL 6 01/01/2021 0949   VLDL 31.2 01/01/2021 0949   LDLCALC 171 (H) 01/01/2021 0949   LDLDIRECT 168.0 08/02/2014 0827    Physical Exam:    VS:  BP (!) 142/90    Pulse 72    Ht 5\' 10"  (1.778 m)    Wt 259 lb (117.5 kg)    SpO2 96%    BMI 37.16 kg/m     Wt Readings from Last 3 Encounters:  03/16/21 259 lb (117.5 kg)  12/29/20 255 lb 6.4 oz (115.8 kg)  12/01/20 262 lb 1.3 oz (118.9 kg)     GEN: Patient is in no acute distress HEENT: Normal NECK: No JVD; No carotid bruits LYMPHATICS: No lymphadenopathy CARDIAC: Hear sounds regular, 2/6 systolic murmur at the apex. RESPIRATORY:  Clear to auscultation without rales, wheezing or rhonchi  ABDOMEN: Soft, non-tender, non-distended MUSCULOSKELETAL:  No edema; No deformity  SKIN: Warm and dry NEUROLOGIC:  Alert and oriented x 3 PSYCHIATRIC:  Normal affect   Signed, Jenean Lindau, MD  03/16/2021 9:30 AM    [place]

## 2021-03-16 NOTE — Patient Instructions (Signed)
Medication Instructions:  Your physician recommends that you continue on your current medications as directed. Please refer to the Current Medication list given to you today.  *If you need a refill on your cardiac medications before your next appointment, please call your pharmacy*   Lab Work: Your physician recommends that you return for lab work in: 3 months You need to have labs done when you are fasting.  You can come Monday through Friday 8:30 am to 12:00 pm and 1:15 to 4:30. You do not need to make an appointment as the order has already been placed. The labs you are going to have done are BMET,  LFT and Lipids.  If you have labs (blood work) drawn today and your tests are completely normal, you will receive your results only by: [place] (if you have MyChart) OR A paper copy in the mail If you have any lab test that is abnormal or we need to change your treatment, we will call you to review the results.   Testing/Procedures: None ordered   Follow-Up: At [facility], you and your health needs are our priority.  As part of our continuing mission to provide you with exceptional heart care, we have created designated Provider Care Teams.  These Care Teams include your primary Cardiologist (physician) and Advanced Practice Providers (APPs -  Physician Assistants and Nurse Practitioners) who all work together to provide you with the care you need, when you need it.  We recommend signing up for the patient portal called "MyChart".  Sign up information is provided on this After Visit Summary.  MyChart is used to connect with patients for Virtual Visits (Telemedicine).  Patients are able to view lab/test results, encounter notes, upcoming appointments, etc.  Non-urgent messages can be sent to your provider as well.   To learn more about what you can do with MyChart, go to [email or web address].    Your next appointment:   6 month(s)  The format for your next appointment:   In  Person  Provider:   Jyl Heinz, MD   Other Instructions NA

## 2021-03-19 ENCOUNTER — Other Ambulatory Visit: Payer: Self-pay | Admitting: Family Medicine

## 2021-03-19 ENCOUNTER — Other Ambulatory Visit (HOSPITAL_COMMUNITY): Payer: Self-pay

## 2021-03-19 MED ORDER — OLMESARTAN MEDOXOMIL-HCTZ 40-12.5 MG PO TABS
1.0000 | ORAL_TABLET | Freq: Every day | ORAL | 1 refills | Status: DC
  Filled 2021-03-19: qty 90, 90d supply, fill #0

## 2021-03-20 ENCOUNTER — Other Ambulatory Visit: Payer: Self-pay | Admitting: Family Medicine

## 2021-03-20 DIAGNOSIS — F419 Anxiety disorder, unspecified: Secondary | ICD-10-CM

## 2021-03-21 ENCOUNTER — Encounter: Payer: Self-pay | Admitting: Family Medicine

## 2021-03-21 ENCOUNTER — Other Ambulatory Visit (HOSPITAL_COMMUNITY): Payer: Self-pay

## 2021-03-21 MED ORDER — PANTOPRAZOLE SODIUM 40 MG PO TBEC
DELAYED_RELEASE_TABLET | Freq: Every day | ORAL | 1 refills | Status: DC
  Filled 2021-03-21: qty 90, 90d supply, fill #0
  Filled 2021-06-19: qty 90, 90d supply, fill #1

## 2021-03-21 MED ORDER — VORTIOXETINE HBR 20 MG PO TABS
ORAL_TABLET | Freq: Every day | ORAL | 1 refills | Status: DC
  Filled 2021-03-21: qty 90, 90d supply, fill #0
  Filled 2021-06-19: qty 90, 90d supply, fill #1

## 2021-03-22 ENCOUNTER — Other Ambulatory Visit (HOSPITAL_COMMUNITY): Payer: Self-pay

## 2021-03-22 MED ORDER — WEGOVY 0.5 MG/0.5ML ~~LOC~~ SOAJ
0.5000 mg | SUBCUTANEOUS | 0 refills | Status: DC
  Filled 2021-03-22: qty 2, 28d supply, fill #0

## 2021-04-07 ENCOUNTER — Encounter: Payer: Self-pay | Admitting: Family Medicine

## 2021-04-10 ENCOUNTER — Telehealth (INDEPENDENT_AMBULATORY_CARE_PROVIDER_SITE_OTHER): Payer: No Typology Code available for payment source | Admitting: Family Medicine

## 2021-04-10 ENCOUNTER — Encounter: Payer: Self-pay | Admitting: Family Medicine

## 2021-04-10 VITALS — BP 148/90 | HR 78 | Resp 20 | Wt 253.0 lb

## 2021-04-10 DIAGNOSIS — E538 Deficiency of other specified B group vitamins: Secondary | ICD-10-CM | POA: Diagnosis not present

## 2021-04-10 DIAGNOSIS — R739 Hyperglycemia, unspecified: Secondary | ICD-10-CM

## 2021-04-10 DIAGNOSIS — F419 Anxiety disorder, unspecified: Secondary | ICD-10-CM | POA: Diagnosis not present

## 2021-04-10 DIAGNOSIS — E669 Obesity, unspecified: Secondary | ICD-10-CM

## 2021-04-10 DIAGNOSIS — I10 Essential (primary) hypertension: Secondary | ICD-10-CM

## 2021-04-10 DIAGNOSIS — E782 Mixed hyperlipidemia: Secondary | ICD-10-CM

## 2021-04-10 DIAGNOSIS — E559 Vitamin D deficiency, unspecified: Secondary | ICD-10-CM

## 2021-04-10 NOTE — Progress Notes (Signed)
? ? ?MyChart Video Visit ? ? ? ?Virtual Visit via Video Note  ? ?This visit type was conducted due to national recommendations for restrictions regarding the COVID-19 Pandemic (e.g. social distancing) in an effort to limit this patient's exposure and mitigate transmission in our community. This patient is at least at moderate risk for complications without adequate follow up. This format is felt to be most appropriate for this patient at this time. Physical exam was limited by quality of the video and audio technology used for the visit. Tomekia Frierson-Sandells, CMA was able to get the patient set up on a video visit. ? ?Patient location:  home,   Patient and provider in visit ?Provider location: Office ? ?I discussed the limitations of evaluation and management by telemedicine and the availability of in person appointments. The patient expressed understanding and agreed to proceed. ? ?Visit Date: 04/10/2021 ? ?Today's healthcare provider: Penni Homans, MD  ? ? ? ?Subjective:  ? ? Patient ID: Nicholas Robertson, male    DOB: 01/25/1986, 36 y.o.   MRN: [id number] ? ?Chief Complaint  ?Patient presents with  ? Follow-up  ? Hypertension  ? Obesity  ? ? ?HPI ?Patient is following up on chronic conditions and medications. Overall he is doing well. No recent febrile illness or hospitalizations. He is trying to stay active and maintain a high quality diet. Frustrated with his weight. Denies CP/palp/SOB/HA/congestion/fevers/GI or GU c/o. Taking meds as prescribed. Has had a cardiac work up which was unremarkable.  ? ?Past Medical History:  ?Diagnosis Date  ? Abnormal LFTs    ? Anxiety and depression 04/18/2013  ? Cardiac murmur 12/01/2020  ? Chest pain 12/01/2020  ? Chest tightness 10/30/2020  ? Essential hypertension 02/07/2013  ? Family history of adverse reaction to anesthesia   ? father is hard to wake after anesthesia  ? Gastroesophageal reflux disease 04/20/2020  ? Hyperlipidemia 02/07/2013  ? Ingrown toenail of both feet  07/05/2018  ? Kidney stones   ? Neck pain 08/02/2019  ? Obesity (BMI 30-39.9) 08/02/2019  ? Obesity (BMI 35.0-39.9 without comorbidity) 12/01/2020  ? Preventative health care 04/18/2013  ? Seborrhea 03/08/2019  ? Vitamin B12 deficiency 09/12/2016  ? Vitamin D deficiency 03/08/2019  ? Wolff-Parkinson-White (WPW) syndrome 02/17/2013  ? S/p ablation.  ? ? ?Past Surgical History:  ?Procedure Laterality Date  ? ABLATION  03/09/2013  ? EPS and RFCA of manifest right posteroloateral accessory pathway  ? EXTRACORPOREAL SHOCK WAVE LITHOTRIPSY Left 11/08/2019  ? Procedure: EXTRACORPOREAL SHOCK WAVE LITHOTRIPSY (ESWL);  Surgeon: [name], MD;  Location[facility];  Service: Urology;  Laterality: Left;  ? FLEXIBLE SIGMOIDOSCOPY  2013  ? [facility]  ? SIGMOIDOSCOPY    ? SUPRAVENTRICULAR TACHYCARDIA ABLATION N/A 03/08/2013  ? Procedure: WPW ABLATION;  Surgeon: [name], MD;  [facility] CATH LAB;  Service: Cardiovascular;  Laterality: N/A;  ? ? ?Family History  ?Problem Relation Age of Onset  ? Hypertension Mother   ? Hyperlipidemia Mother   ? Mental illness Mother   ?     anxiety  ? Other Mother   ?     glucose intolerant  ? Colon polyps Mother   ? Diabetes Father   ?     2  ? Hypertension Father   ? Arthritis Father   ? Colon polyps Father   ? Psoriasis Maternal Grandmother   ? Hypertension Maternal Grandmother   ? Cancer Maternal Grandmother   ?  lymphoma  ? Hyperlipidemia Maternal Grandfather   ? Heart disease Maternal Grandfather   ?     cabg  ? Hypertension Maternal Grandfather   ? Polymyalgia rheumatica Maternal Grandfather   ? Hypertension Paternal Grandmother   ? Diabetes Paternal Grandmother   ?     2  ? COPD Paternal Grandmother   ? Heart disease Paternal Grandfather   ?     s/p MI, s/p CABG  ? Hyperlipidemia Paternal Grandfather   ? Hypertension Paternal Grandfather   ? Diabetes Paternal Grandfather   ?     1  ? Colon cancer Neg Hx   ? Esophageal cancer  Neg Hx   ? Rectal cancer Neg Hx   ? Stomach cancer Neg Hx   ? ? ?Social History  ? ?Socioeconomic History  ? Marital status: Married  ?  Spouse name: Not on file  ? Number of children: Not on file  ? Years of education: Not on file  ? Highest education level: Not on file  ?Occupational History  ? Not on file  ?Tobacco Use  ? Smoking status: Never  ? Smokeless tobacco: Never  ?Vaping Use  ? Vaping Use: Never used  ?Substance and Sexual Activity  ? Alcohol use: No  ? Drug use: No  ? Sexual activity: Yes  ?  Comment: no dietary restricitons, lives alone with cat, seat belts  ?Other Topics Concern  ? Not on file  ?Social History Narrative  ? Works at [employer], as [place]  ? Lives with partner  ? No major dietary restrictions.   ? Pet: cat, dog  ? ?Social Determinants of Health  ? ?Financial Resource Strain: Not on file  ?Food Insecurity: Not on file  ?Transportation Needs: Not on file  ?Physical Activity: Not on file  ?Stress: Not on file  ?Social Connections: Not on file  ?Intimate Partner Violence: Not on file  ? ? ?Outpatient Medications Prior to Visit  ?Medication Sig Dispense Refill  ? amLODipine (NORVASC) 5 MG tablet TAKE 1 TABLET BY MOUTH ONCE A DAY 90 tablet 3  ? cyanocobalamin 1000 MCG tablet Take 1,000 mcg by mouth daily.    ? famotidine (PEPCID) 40 MG tablet TAKE 1 TABLET BY MOUTH ONCE A DAY 90 tablet 1  ? metoprolol succinate (TOPROL-XL) 50 MG 24 hr tablet TAKE 1 TABLET BY MOUTH DAILY. TAKE WITH OR IMMEDIATELY FOLLOWING A MEAL 90 tablet 3  ? olmesartan-hydrochlorothiazide (BENICAR HCT) 40-12.5 MG tablet Take 1 tablet by mouth daily. 90 tablet 1  ? pantoprazole (PROTONIX) 40 MG tablet TAKE 1 TABLET (40 MG TOTAL) BY MOUTH DAILY. 90 tablet 1  ? Semaglutide-Weight Management (WEGOVY) 0.5 MG/0.5ML SOAJ Inject 0.5 mg into the skin once a week. 2 mL 0  ? vortioxetine HBr (TRINTELLIX) 20 MG TABS tablet TAKE 1 TABLET BY MOUTH ONCE A DAY 90 tablet 1  ? losartan (COZAAR) 50 MG tablet Take by mouth.    ? ?No  facility-administered medications prior to visit.  ? ? ?No Known Allergies ? ?Review of Systems  ?Constitutional:  Negative for fever and malaise/fatigue.  ?HENT:  Negative for congestion.   ?Eyes:  Negative for blurred vision.  ?Respiratory:  Negative for shortness of breath.   ?Cardiovascular:  Negative for chest pain, palpitations and leg swelling.  ?Gastrointestinal:  Negative for abdominal pain, blood in stool and nausea.  ?Genitourinary:  Negative for dysuria and frequency.  ?Musculoskeletal:  Negative for falls.  ?Skin:  Negative for rash.  ?Neurological:  Negative for dizziness, loss of consciousness and headaches.  ?Endo/Heme/Allergies:  Negative for environmental allergies.  ?Psychiatric/Behavioral:  Negative for depression. The patient is not nervous/anxious.   ? ?   ?Objective:  ?  ?Physical Exam ?Constitutional:   ?   General: He is not in acute distress. ?   Appearance: Normal appearance. He is not ill-appearing or toxic-appearing.  ?HENT:  ?   Head: Normocephalic and atraumatic.  ?   Right Ear: External ear normal.  ?   Left Ear: External ear normal.  ?   Nose: Nose normal.  ?Eyes:  ?   General:     ?   Right eye: No discharge.     ?   Left eye: No discharge.  ?Pulmonary:  ?   Effort: Pulmonary effort is normal.  ?Skin: ?   Findings: No rash.  ?Neurological:  ?   Mental Status: He is alert and oriented to person, place, and time.  ?Psychiatric:     ?   Behavior: Behavior normal.  ? ? ?BP (!) 148/90   Pulse 78   Resp 20   Wt 253 lb (114.8 kg)   SpO2 98%   BMI 36.30 kg/m?  ?Wt Readings from Last 3 Encounters:  ?04/10/21 253 lb (114.8 kg)  ?03/16/21 259 lb (117.5 kg)  ?12/29/20 255 lb 6.4 oz (115.8 kg)  ? ? ?Diabetic Foot Exam - Simple   ?No data filed ?  ? ?Lab Results  ?Component Value Date  ? WBC 6.4 01/01/2021  ? HGB 15.5 01/01/2021  ? HCT 45.2 01/01/2021  ? PLT 220.0 01/01/2021  ? GLUCOSE 90 01/01/2021  ? CHOL 246 (H) 01/01/2021  ? TRIG 156.0 (H) 01/01/2021  ? HDL 43.80 01/01/2021  ? LDLDIRECT  168.0 08/02/2014  ? LDLCALC 171 (H) 01/01/2021  ? ALT 83 (H) 01/01/2021  ? AST 44 (H) 01/01/2021  ? NA 140 01/01/2021  ? K 3.5 01/01/2021  ? CL 101 01/01/2021  ? CREATININE 0.78 01/01/2021  ? BUN 12 01/01/2021  ? CO

## 2021-04-11 DIAGNOSIS — E1169 Type 2 diabetes mellitus with other specified complication: Secondary | ICD-10-CM | POA: Insufficient documentation

## 2021-04-11 DIAGNOSIS — R739 Hyperglycemia, unspecified: Secondary | ICD-10-CM | POA: Insufficient documentation

## 2021-04-11 NOTE — Assessment & Plan Note (Signed)
Well controlled, no changes to meds. Encouraged heart healthy diet such as the DASH diet and exercise as tolerated. Numbers have been mildly labile at home. If his numbers continue to run high consider increasing HCTZ or increasing Amlodipine to 7.5 mg daily ?

## 2021-04-11 NOTE — Assessment & Plan Note (Signed)
Supplement and monitor 

## 2021-04-11 NOTE — Assessment & Plan Note (Signed)
Did not tolerate Atorvastatin. Consider low dose Rosuvastatin. Encourage heart healthy diet such as MIND or DASH diet, increase exercise, avoid trans fats, simple carbohydrates and processed foods, consider a krill or fish or flaxseed oil cap daily.  

## 2021-04-11 NOTE — Assessment & Plan Note (Signed)
Doing well with job change and on current meds. No changes ?

## 2021-04-11 NOTE — Assessment & Plan Note (Addendum)
Encouraged DASH or MIND diet, decrease po intake and increase exercise as tolerated. Needs 7-8 hours of sleep nightly. Avoid trans fats, eat small, frequent meals every 4-5 hours with lean proteins, complex carbs and healthy fats. Minimize simple carbs, high fat foods and processed foods. Tolerating Wegovy 0.5 increase to 1 mg next month ?

## 2021-04-11 NOTE — Assessment & Plan Note (Signed)
hgba1c acceptable, minimize simple carbs. Increase exercise as tolerated.  

## 2021-04-17 ENCOUNTER — Other Ambulatory Visit: Payer: Self-pay | Admitting: Family Medicine

## 2021-04-17 ENCOUNTER — Other Ambulatory Visit (HOSPITAL_COMMUNITY): Payer: Self-pay

## 2021-04-17 ENCOUNTER — Encounter: Payer: Self-pay | Admitting: Family Medicine

## 2021-04-17 MED ORDER — OLMESARTAN MEDOXOMIL-HCTZ 40-25 MG PO TABS
1.0000 | ORAL_TABLET | Freq: Every day | ORAL | 1 refills | Status: DC
  Filled 2021-04-17: qty 90, 90d supply, fill #0
  Filled 2021-07-04: qty 90, 90d supply, fill #1

## 2021-06-01 ENCOUNTER — Other Ambulatory Visit (INDEPENDENT_AMBULATORY_CARE_PROVIDER_SITE_OTHER): Payer: No Typology Code available for payment source

## 2021-06-01 DIAGNOSIS — E559 Vitamin D deficiency, unspecified: Secondary | ICD-10-CM

## 2021-06-01 DIAGNOSIS — I10 Essential (primary) hypertension: Secondary | ICD-10-CM | POA: Diagnosis not present

## 2021-06-01 DIAGNOSIS — E538 Deficiency of other specified B group vitamins: Secondary | ICD-10-CM

## 2021-06-01 DIAGNOSIS — R739 Hyperglycemia, unspecified: Secondary | ICD-10-CM

## 2021-06-01 DIAGNOSIS — E782 Mixed hyperlipidemia: Secondary | ICD-10-CM | POA: Diagnosis not present

## 2021-06-01 LAB — CBC
HCT: 46.4 % (ref 39.0–52.0)
Hemoglobin: 16.1 g/dL (ref 13.0–17.0)
MCHC: 34.8 g/dL (ref 30.0–36.0)
MCV: 91.4 fl (ref 78.0–100.0)
Platelets: 203 10*3/uL (ref 150.0–400.0)
RBC: 5.07 Mil/uL (ref 4.22–5.81)
RDW: 13.4 % (ref 11.5–15.5)
WBC: 6.2 10*3/uL (ref 4.0–10.5)

## 2021-06-01 LAB — COMPREHENSIVE METABOLIC PANEL
ALT: 129 U/L — ABNORMAL HIGH (ref 0–53)
AST: 61 U/L — ABNORMAL HIGH (ref 0–37)
Albumin: 4.7 g/dL (ref 3.5–5.2)
Alkaline Phosphatase: 58 U/L (ref 39–117)
BUN: 14 mg/dL (ref 6–23)
CO2: 28 mEq/L (ref 19–32)
Calcium: 9.6 mg/dL (ref 8.4–10.5)
Chloride: 100 mEq/L (ref 96–112)
Creatinine, Ser: 0.89 mg/dL (ref 0.40–1.50)
GFR: 110.85 mL/min (ref >60.00)
Glucose, Bld: 97 mg/dL (ref 70–99)
Potassium: 4.1 mEq/L (ref 3.5–5.1)
Sodium: 140 mEq/L (ref 135–145)
Total Bilirubin: 0.8 mg/dL (ref 0.2–1.2)
Total Protein: 7.1 g/dL (ref 6.0–8.3)

## 2021-06-01 LAB — LIPID PANEL
Cholesterol: 256 mg/dL — ABNORMAL HIGH (ref 0–200)
HDL: 50.7 mg/dL (ref >39.00)
LDL Cholesterol: 169 mg/dL — ABNORMAL HIGH (ref 0–99)
NonHDL: 205.07
Total CHOL/HDL Ratio: 5
Triglycerides: 179 mg/dL — ABNORMAL HIGH (ref 0.0–149.0)
VLDL: 35.8 mg/dL (ref 0.0–40.0)

## 2021-06-01 LAB — TSH: TSH: 3.04 u[IU]/mL (ref 0.35–5.50)

## 2021-06-01 LAB — VITAMIN D 25 HYDROXY (VIT D DEFICIENCY, FRACTURES): VITD: 35.58 ng/mL (ref 30.00–100.00)

## 2021-06-01 LAB — HEMOGLOBIN A1C: Hgb A1c MFr Bld: 5.9 % (ref 4.6–6.5)

## 2021-06-01 LAB — VITAMIN B12: Vitamin B-12: 386 pg/mL (ref 211–911)

## 2021-06-19 ENCOUNTER — Other Ambulatory Visit (HOSPITAL_COMMUNITY): Payer: Self-pay

## 2021-07-05 ENCOUNTER — Other Ambulatory Visit (HOSPITAL_COMMUNITY): Payer: Self-pay

## 2021-07-09 ENCOUNTER — Other Ambulatory Visit (HOSPITAL_COMMUNITY): Payer: Self-pay

## 2021-07-10 ENCOUNTER — Other Ambulatory Visit (HOSPITAL_COMMUNITY): Payer: Self-pay

## 2021-07-10 ENCOUNTER — Encounter: Payer: Self-pay | Admitting: Family Medicine

## 2021-07-10 MED ORDER — WEGOVY 0.5 MG/0.5ML ~~LOC~~ SOAJ
0.5000 mg | SUBCUTANEOUS | 0 refills | Status: DC
  Filled 2021-07-10: qty 2, 28d supply, fill #0

## 2021-07-11 NOTE — Telephone Encounter (Signed)
Prior auth sent for Wegovy 0.5. Awaiting response from insurance company.

## 2021-07-24 ENCOUNTER — Other Ambulatory Visit (HOSPITAL_COMMUNITY): Payer: Self-pay

## 2021-08-01 ENCOUNTER — Other Ambulatory Visit (HOSPITAL_COMMUNITY): Payer: Self-pay

## 2021-08-27 ENCOUNTER — Ambulatory Visit (INDEPENDENT_AMBULATORY_CARE_PROVIDER_SITE_OTHER): Payer: No Typology Code available for payment source | Admitting: Family Medicine

## 2021-08-27 ENCOUNTER — Encounter: Payer: Self-pay | Admitting: Family Medicine

## 2021-08-27 ENCOUNTER — Ambulatory Visit (INDEPENDENT_AMBULATORY_CARE_PROVIDER_SITE_OTHER): Payer: No Typology Code available for payment source

## 2021-08-27 VITALS — BP 138/84 | HR 61 | Temp 99.0°F | Resp 16 | Ht 70.0 in | Wt 249.0 lb

## 2021-08-27 DIAGNOSIS — R1011 Right upper quadrant pain: Secondary | ICD-10-CM

## 2021-08-27 LAB — CBC WITH DIFFERENTIAL/PLATELET
Basophils Absolute: 0 10*3/uL (ref 0.0–0.1)
Basophils Relative: 0.7 % (ref 0.0–3.0)
Eosinophils Absolute: 0.1 10*3/uL (ref 0.0–0.7)
Eosinophils Relative: 1 % (ref 0.0–5.0)
HCT: 46.7 % (ref 39.0–52.0)
Hemoglobin: 16.2 g/dL (ref 13.0–17.0)
Lymphocytes Relative: 40.7 % (ref 12.0–46.0)
Lymphs Abs: 2.8 10*3/uL (ref 0.7–4.0)
MCHC: 34.6 g/dL (ref 30.0–36.0)
MCV: 91.9 fl (ref 78.0–100.0)
Monocytes Absolute: 0.6 10*3/uL (ref 0.1–1.0)
Monocytes Relative: 8.1 % (ref 3.0–12.0)
Neutro Abs: 3.4 10*3/uL (ref 1.4–7.7)
Neutrophils Relative %: 49.5 % (ref 43.0–77.0)
Platelets: 220 10*3/uL (ref 150.0–400.0)
RBC: 5.08 Mil/uL (ref 4.22–5.81)
RDW: 12.6 % (ref 11.5–15.5)
WBC: 6.8 10*3/uL (ref 4.0–10.5)

## 2021-08-27 MED ORDER — HYDROCODONE-ACETAMINOPHEN 5-325 MG PO TABS: 1.0000 | ORAL_TABLET | Freq: Four times a day (QID) | ORAL | 0 refills | Status: DC | PRN

## 2021-08-27 NOTE — Patient Instructions (Signed)
Follow up as needed We'll notify you of your lab results and make any changes if needed We'll get you scheduled for your [place] Use the pain meds as needed Call with any questions or concerns Hang in there!!

## 2021-08-27 NOTE — Progress Notes (Signed)
   Subjective:    Patient ID: Nicholas Robertson, male    DOB: March 08, 1985, 36 y.o.   MRN: [id number]  HPI RUQ pain- sxs started 4-5 days ago w/ 'a little twinge'.  Initially thought it was muscular.  Pt is now more bloated and has more frequent pain.  Described as 'building pain.  Pressure and relief'.  Can have sharp stabbing pains.  Initially food caused sxs to occur.  Today has been more constant pain.  Occasional nausea, no vomiting.  No bowel changes.  TTP at times over RUQ.  No fevers.  Has been on Wegovy off and on for months but consistently x3 weeks.   Review of Systems For ROS see HPI     Objective:   Physical Exam Vitals reviewed.  Constitutional:      General: He is not in acute distress.    Appearance: He is well-developed. He is not ill-appearing.  HENT:     Head: Normocephalic and atraumatic.  Eyes:     Extraocular Movements: Extraocular movements intact.     Conjunctiva/sclera: Conjunctivae normal.     Pupils: Pupils are equal, round, and reactive to light.  Neck:     Thyroid: No thyromegaly.  Cardiovascular:     Rate and Rhythm: Normal rate and regular rhythm.     Pulses: Normal pulses.     Heart sounds: Normal heart sounds. No murmur heard. Pulmonary:     Effort: Pulmonary effort is normal. No respiratory distress.     Breath sounds: Normal breath sounds.  Abdominal:     General: Bowel sounds are normal. There is no distension.     Palpations: Abdomen is soft.     Tenderness: There is abdominal tenderness in the right upper quadrant. Positive signs include Murphy's sign.  Musculoskeletal:     Cervical back: Normal range of motion and neck supple.     Right lower leg: No edema.     Left lower leg: No edema.  Lymphadenopathy:     Cervical: No cervical adenopathy.  Skin:    General: Skin is warm and dry.  Neurological:     General: No focal deficit present.     Mental Status: He is alert and oriented to person, place, and time.     Cranial Nerves: No cranial  nerve deficit.  Psychiatric:        Mood and Affect: Mood normal.        Behavior: Behavior normal.           Assessment & Plan:  RUQ pain- new.  Pt's location of pain, the fact that it is worse w/ eating, his use of Wegovy (which has been linked to gallstones) and nausea are all concerning for possible biliary colic/cholecystitis.  Check labs to assess WBC, biliary function, amylase/lipase.  Get stat RUQ [place].  Hydrocodone prn.  Reviewed supportive care and red flags that should prompt return.  Pt expressed understanding and is in agreement w/ plan.

## 2021-08-27 NOTE — Progress Notes (Signed)
Informed pt of US results  

## 2021-08-28 ENCOUNTER — Ambulatory Visit: Payer: No Typology Code available for payment source | Admitting: Family

## 2021-08-28 LAB — BASIC METABOLIC PANEL
BUN: 11 mg/dL (ref 6–23)
CO2: 27 mEq/L (ref 19–32)
Calcium: 9.7 mg/dL (ref 8.4–10.5)
Chloride: 103 mEq/L (ref 96–112)
Creatinine, Ser: 0.94 mg/dL (ref 0.40–1.50)
GFR: 104.68 mL/min (ref >60.00)
Glucose, Bld: 115 mg/dL — ABNORMAL HIGH (ref 70–99)
Potassium: 3.3 mEq/L — ABNORMAL LOW (ref 3.5–5.1)
Sodium: 140 mEq/L (ref 135–145)

## 2021-08-28 LAB — AMYLASE: Amylase: 73 U/L (ref 27–131)

## 2021-08-28 LAB — LIPASE: Lipase: 27 U/L (ref 11.0–59.0)

## 2021-08-28 LAB — HEPATIC FUNCTION PANEL
ALT: 78 U/L — ABNORMAL HIGH (ref 0–53)
AST: 37 U/L (ref 0–37)
Albumin: 4.6 g/dL (ref 3.5–5.2)
Alkaline Phosphatase: 56 U/L (ref 39–117)
Bilirubin, Direct: 0.1 mg/dL (ref 0.0–0.3)
Total Bilirubin: 0.6 mg/dL (ref 0.2–1.2)
Total Protein: 7.5 g/dL (ref 6.0–8.3)

## 2021-08-28 NOTE — Progress Notes (Signed)
Pt seen results via my chart  

## 2021-08-29 IMAGING — DX DG ABDOMEN 1V
2 series · 2 of 2 positions shown · non-contrast
Comparison: CT 08/19/2019

CLINICAL DATA: Preop for lithotripsy.

EXAM:
ABDOMEN - 1 VIEW

[abdomen kub (1 of 2)]
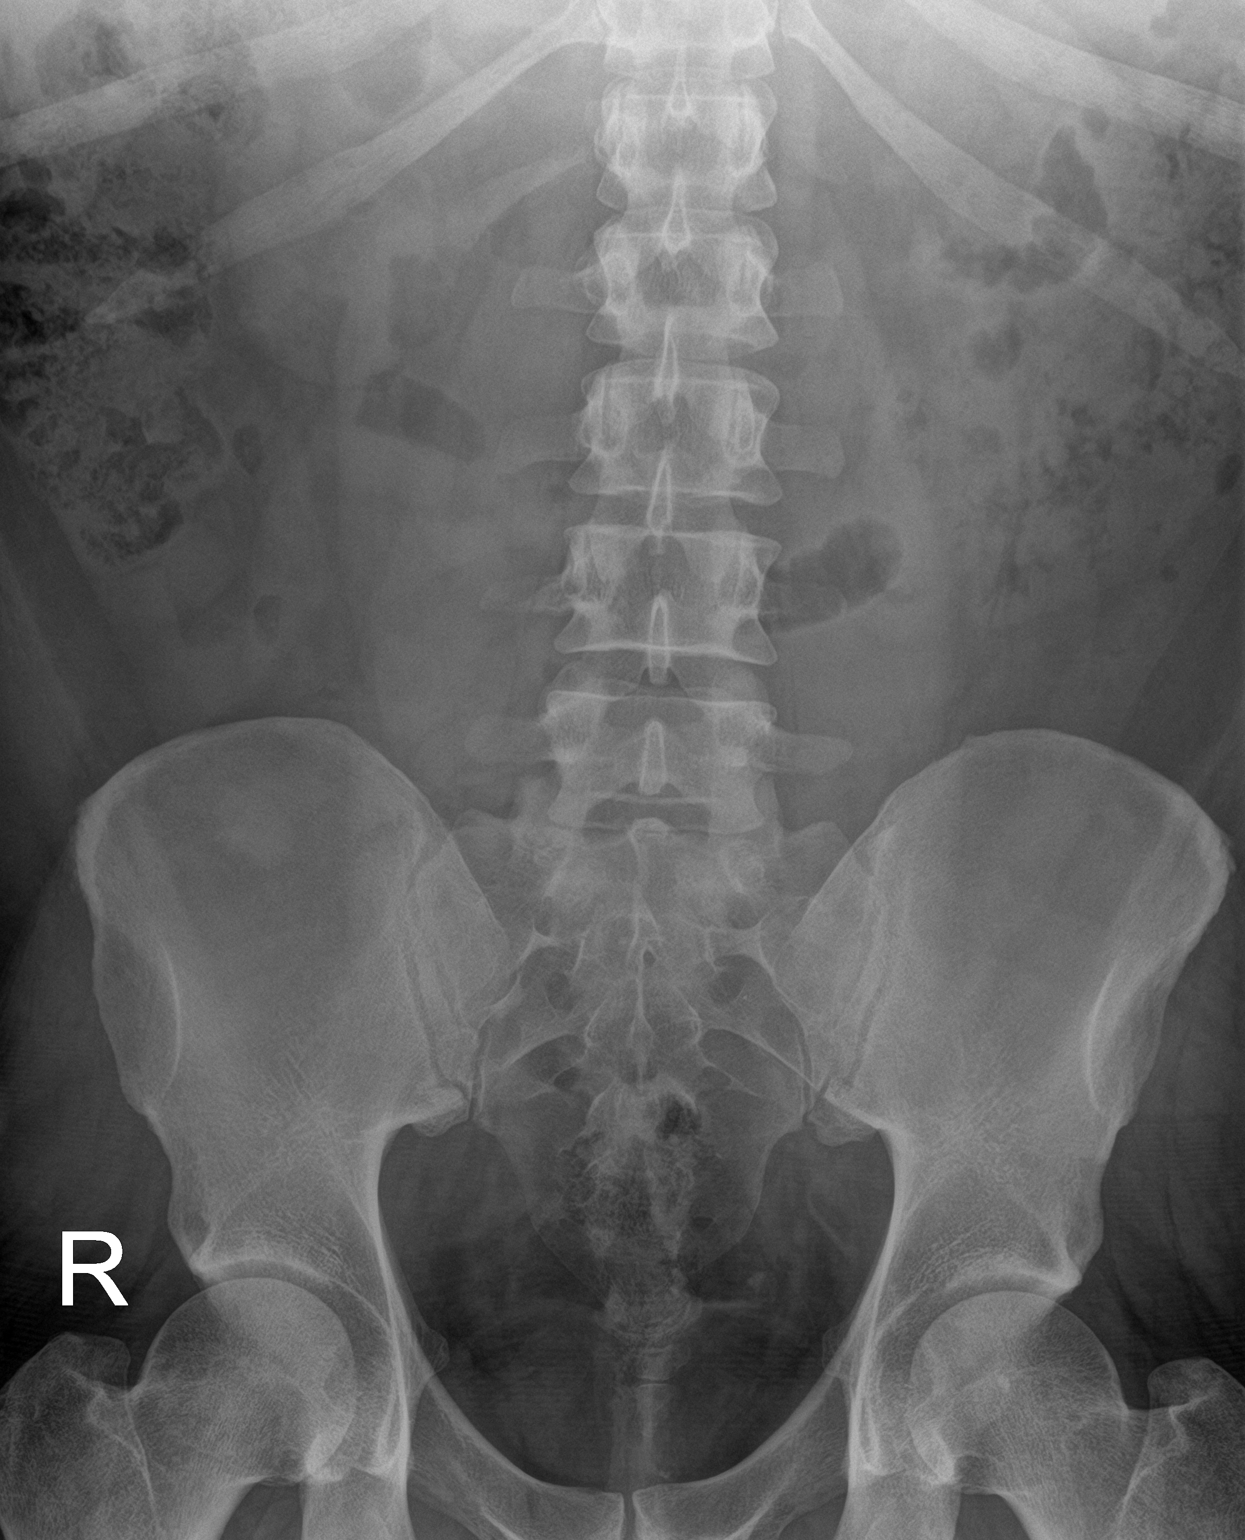

[abdomen kub (2 of 2)]
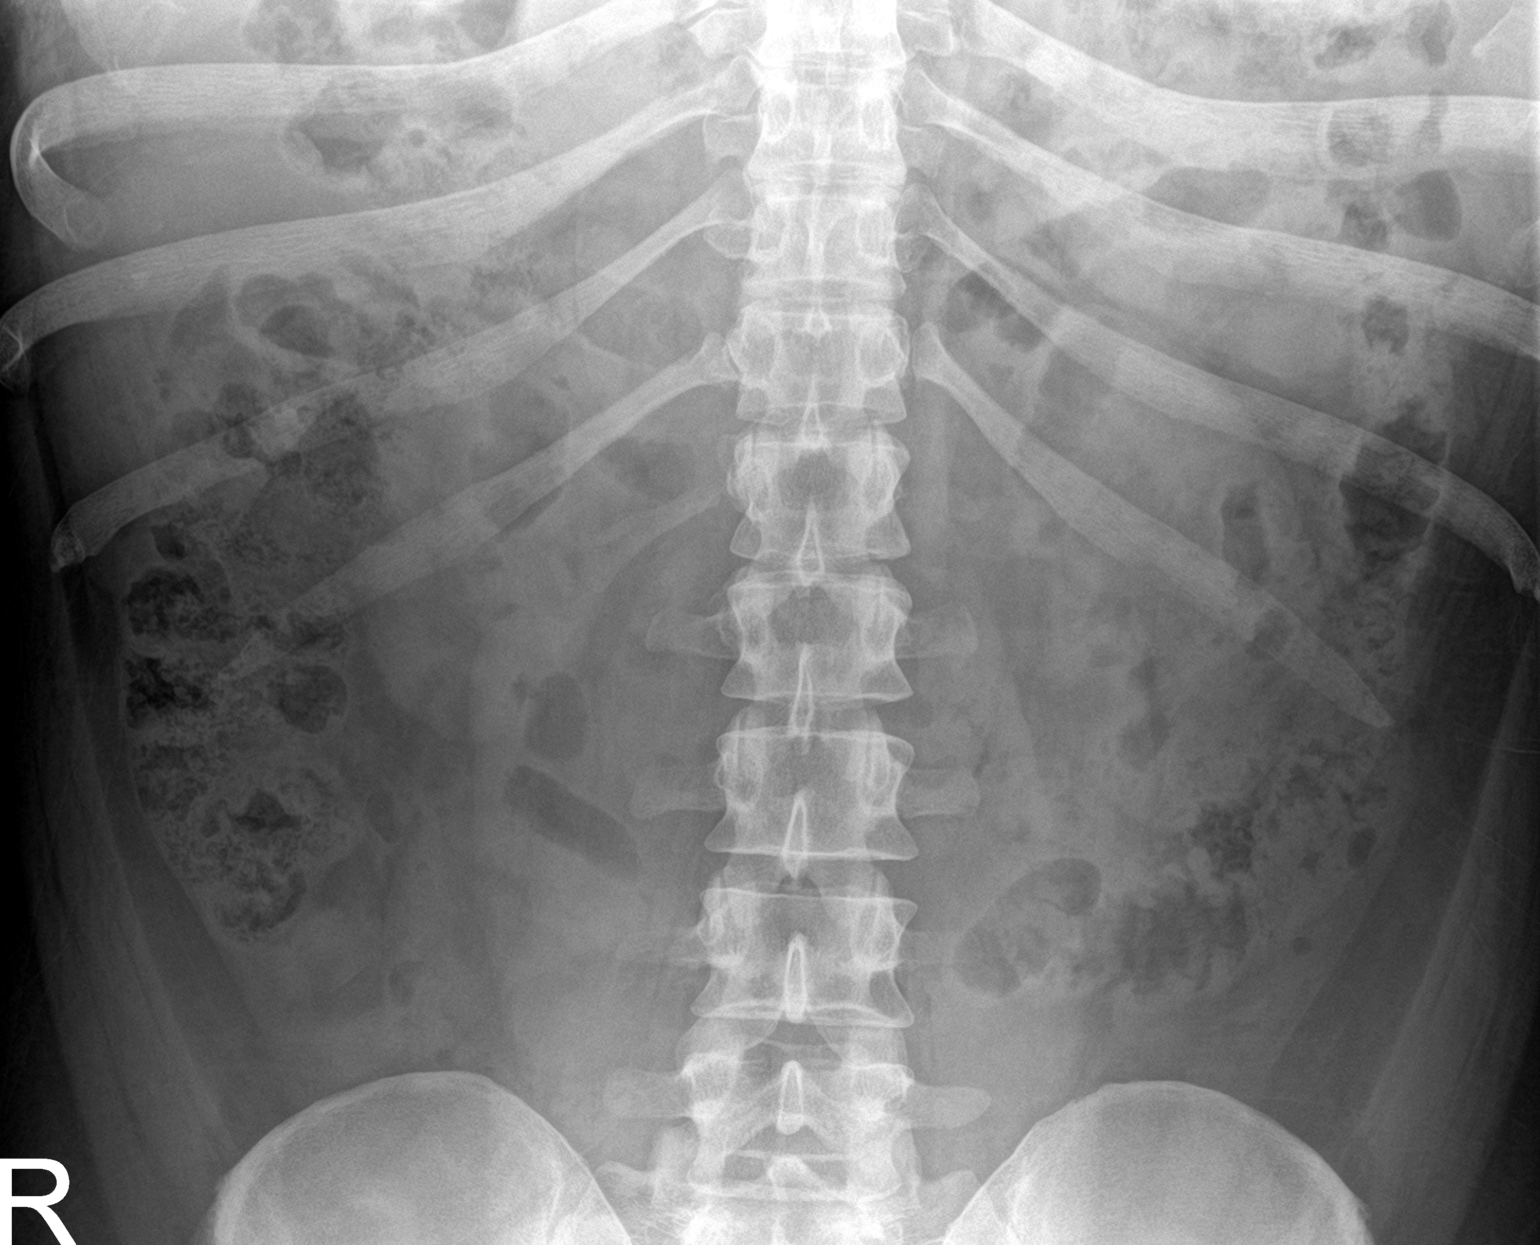

[2 of 2 positions shown; findings below may reference images not displayed]

FINDINGS: The bowel gas pattern is normal. 5 mm calcification within the left
side of the pelvis. Concerning for distal ureteral calculus. No
additional urinary tract calculi identified.
IMPRESSION: Suspect 5 mm distal left ureteral calculus.

## 2021-09-03 ENCOUNTER — Other Ambulatory Visit: Payer: Self-pay | Admitting: Family Medicine

## 2021-09-03 ENCOUNTER — Other Ambulatory Visit (HOSPITAL_COMMUNITY): Payer: Self-pay

## 2021-09-04 ENCOUNTER — Other Ambulatory Visit: Payer: Self-pay | Admitting: Family Medicine

## 2021-09-04 MED ORDER — WEGOVY 1 MG/0.5ML ~~LOC~~ SOAJ
1.0000 mg | SUBCUTANEOUS | 1 refills | Status: DC
  Filled 2021-09-04: qty 2, 28d supply, fill #0
  Filled 2021-10-11: qty 2, 28d supply, fill #1

## 2021-09-05 ENCOUNTER — Other Ambulatory Visit (HOSPITAL_COMMUNITY): Payer: Self-pay

## 2021-09-10 ENCOUNTER — Other Ambulatory Visit (HOSPITAL_COMMUNITY): Payer: Self-pay

## 2021-09-17 ENCOUNTER — Other Ambulatory Visit: Payer: Self-pay | Admitting: Family Medicine

## 2021-09-17 ENCOUNTER — Other Ambulatory Visit (HOSPITAL_COMMUNITY): Payer: Self-pay

## 2021-09-17 DIAGNOSIS — F419 Anxiety disorder, unspecified: Secondary | ICD-10-CM

## 2021-09-17 MED ORDER — PANTOPRAZOLE SODIUM 40 MG PO TBEC
DELAYED_RELEASE_TABLET | Freq: Every day | ORAL | 1 refills | Status: DC
  Filled 2021-09-17: qty 90, 90d supply, fill #0
  Filled 2021-12-13: qty 90, 90d supply, fill #1

## 2021-09-17 MED ORDER — FAMOTIDINE 40 MG PO TABS
ORAL_TABLET | Freq: Every day | ORAL | 1 refills | Status: DC
  Filled 2021-09-17: qty 90, 90d supply, fill #0
  Filled 2021-12-13: qty 90, 90d supply, fill #1

## 2021-09-17 MED ORDER — VORTIOXETINE HBR 20 MG PO TABS
20.0000 mg | ORAL_TABLET | Freq: Every day | ORAL | 1 refills | Status: DC
  Filled 2021-09-17: qty 90, 90d supply, fill #0
  Filled 2021-12-13: qty 90, 90d supply, fill #1

## 2021-10-11 ENCOUNTER — Other Ambulatory Visit: Payer: Self-pay | Admitting: Family Medicine

## 2021-10-11 DIAGNOSIS — I10 Essential (primary) hypertension: Secondary | ICD-10-CM

## 2021-10-11 DIAGNOSIS — I456 Pre-excitation syndrome: Secondary | ICD-10-CM

## 2021-10-12 ENCOUNTER — Other Ambulatory Visit (HOSPITAL_COMMUNITY): Payer: Self-pay

## 2021-10-12 MED ORDER — OLMESARTAN MEDOXOMIL-HCTZ 40-25 MG PO TABS
1.0000 | ORAL_TABLET | Freq: Every day | ORAL | 1 refills | Status: DC
  Filled 2021-10-12: qty 90, 90d supply, fill #0
  Filled 2022-01-17: qty 90, 90d supply, fill #1

## 2021-10-12 MED ORDER — METOPROLOL SUCCINATE ER 50 MG PO TB24
50.0000 mg | ORAL_TABLET | Freq: Every day | ORAL | 3 refills | Status: DC
  Filled 2021-10-12: qty 90, 90d supply, fill #0
  Filled 2021-12-13 – 2022-01-17 (×2): qty 90, 90d supply, fill #1
  Filled 2022-04-07: qty 90, 90d supply, fill #2
  Filled 2022-07-05: qty 90, 90d supply, fill #3

## 2021-11-01 ENCOUNTER — Encounter: Payer: Self-pay | Admitting: Family Medicine

## 2021-11-01 ENCOUNTER — Ambulatory Visit (INDEPENDENT_AMBULATORY_CARE_PROVIDER_SITE_OTHER): Payer: No Typology Code available for payment source | Admitting: Family Medicine

## 2021-11-01 ENCOUNTER — Telehealth: Payer: No Typology Code available for payment source

## 2021-11-01 VITALS — BP 136/84 | HR 73 | Temp 97.4°F | Resp 17 | Ht 70.0 in | Wt 248.0 lb

## 2021-11-01 DIAGNOSIS — Z23 Encounter for immunization: Secondary | ICD-10-CM

## 2021-11-01 DIAGNOSIS — Z0282 Encounter for adoption services: Secondary | ICD-10-CM | POA: Diagnosis not present

## 2021-11-01 NOTE — Progress Notes (Signed)
   Subjective:    Patient ID: Nicholas Robertson, male    DOB: January 22, 1986, 36 y.o.   MRN: [id number]  HPI Adoption forms- pt needs forms updated for his adoption file.  He reports no medical changes, no medication changes, and things are going well at this time.   Review of Systems For ROS see HPI     Objective:   Physical Exam Vitals reviewed.  Constitutional:      General: He is not in acute distress.    Appearance: Normal appearance. He is obese. He is not ill-appearing.  HENT:     Head: Normocephalic and atraumatic.  Eyes:     Extraocular Movements: Extraocular movements intact.     Conjunctiva/sclera: Conjunctivae normal.     Pupils: Pupils are equal, round, and reactive to light.  Cardiovascular:     Rate and Rhythm: Normal rate and regular rhythm.  Pulmonary:     Effort: Pulmonary effort is normal. No respiratory distress.  Skin:    General: Skin is warm and dry.  Neurological:     General: No focal deficit present.     Mental Status: He is alert and oriented to person, place, and time.  Psychiatric:        Mood and Affect: Mood normal.        Behavior: Behavior normal.        Thought Content: Thought content normal.           Assessment & Plan:   Adoption encounter- pt's medical forms updated in the hopes they will soon find a baby.  These forms were returned directly to the pt and he left visit w/ them in hand.

## 2021-11-01 NOTE — Patient Instructions (Signed)
Follow up as needed or as scheduled Continue to work on healthy diet and regular exercise- you can do it!! Call with any questions or concerns Stay Safe!  Stay Healthy! Happy Fall!!!

## 2021-12-06 ENCOUNTER — Other Ambulatory Visit (HOSPITAL_COMMUNITY): Payer: Self-pay

## 2021-12-13 ENCOUNTER — Other Ambulatory Visit (HOSPITAL_COMMUNITY): Payer: Self-pay

## 2022-01-17 ENCOUNTER — Other Ambulatory Visit (HOSPITAL_COMMUNITY): Payer: Self-pay

## 2022-02-21 ENCOUNTER — Telehealth: Payer: Self-pay | Admitting: Urgent Care

## 2022-02-21 DIAGNOSIS — J018 Other acute sinusitis: Secondary | ICD-10-CM

## 2022-02-21 MED ORDER — PREDNISONE 50 MG PO TABS: 50.0000 mg | ORAL_TABLET | Freq: Every day | ORAL | 0 refills | Status: AC

## 2022-02-21 MED ORDER — DOXYCYCLINE HYCLATE 100 MG PO TABS: 100.0000 mg | ORAL_TABLET | Freq: Two times a day (BID) | ORAL | 0 refills | Status: AC

## 2022-02-21 NOTE — Progress Notes (Signed)
E-Visit for Sinus Problems  We are sorry that you are not feeling well.  Here is how we plan to help!  Based on what you have shared with me it looks like you have sinusitis.  Sinusitis is inflammation and infection in the sinus cavities of the head.  Based on your presentation I believe you most likely have Acute Bacterial Sinusitis.  This is an infection caused by bacteria and is treated with antibiotics. I have prescribed Doxycycline '100mg'$  by mouth twice a day for 10 days. You may use an oral decongestant such as Mucinex D or if you have glaucoma or high blood pressure use plain Mucinex. Saline nasal spray help and can safely be used as often as needed for congestion.  If you develop worsening sinus pain, fever or notice severe headache and vision changes, or if symptoms are not better after completion of antibiotic, please schedule an appointment with a health care provider.    Given your recent Covid-19 infection, it may also be beneficial to try a short course of steroids in addition to your antibiotics. This will help with any inflammation of the sinus passages. Please take one tablet daily in the morning with breakfast. Please monitor your heart rate and if you develop bradycardia, stop taking.  Please continue your steam baths, humidifier use and nasal sprays.  Sinus infections are not as easily transmitted as other respiratory infection, however we still recommend that you avoid close contact with loved ones, especially the very young and elderly.  Remember to wash your hands thoroughly throughout the day as this is the number one way to prevent the spread of infection!  Home Care: Only take medications as instructed by your medical team. Complete the entire course of an antibiotic. Do not take these medications with alcohol. A steam or ultrasonic humidifier can help congestion.  You can place a towel over your head and breathe in the steam from hot water coming from a faucet. Avoid close  contacts especially the very young and the elderly. Cover your mouth when you cough or sneeze. Always remember to wash your hands.  Get Help Right Away If: You develop worsening fever or sinus pain. You develop a severe head ache or visual changes. Your symptoms persist after you have completed your treatment plan.  Make sure you Understand these instructions. Will watch your condition. Will get help right away if you are not doing well or get worse.  Thank you for choosing an e-visit.  Your e-visit answers were reviewed by a board certified advanced clinical practitioner to complete your personal care plan. Depending upon the condition, your plan could have included both over the counter or prescription medications.  Please review your pharmacy choice. Make sure the pharmacy is open so you can pick up prescription now. If there is a problem, you may contact your provider through [employer] and have the prescription routed to another pharmacy.  Your safety is important to [place]. If you have drug allergies check your prescription carefully.   For the next 24 hours you can use MyChart to ask questions about today's visit, request a non-urgent call back, or ask for a work or school excuse. You will get an email in the next two days asking about your experience. I hope that your e-visit has been valuable and will speed your recovery.   I have spent 5 minutes in review of e-visit questionnaire, review and updating patient chart, medical decision making and response to patient.  [place], [place]

## 2022-02-24 NOTE — Assessment & Plan Note (Signed)
Supplement and monitor 

## 2022-02-24 NOTE — Assessment & Plan Note (Signed)
Well controlled, no changes to meds. Encouraged heart healthy diet such as the DASH diet and exercise as tolerated.  °

## 2022-02-24 NOTE — Assessment & Plan Note (Signed)
Encouraged DASH or MIND diet, decrease po intake and increase exercise as tolerated. Needs 7-8 hours of sleep nightly. Avoid trans fats, eat small, frequent meals every 4-5 hours with lean proteins, complex carbs and healthy fats. Minimize simple carbs, high fat foods and processed foods 

## 2022-02-24 NOTE — Assessment & Plan Note (Signed)
Did not tolerate Atorvastatin. Consider low dose Rosuvastatin. Encourage heart healthy diet such as MIND or DASH diet, increase exercise, avoid trans fats, simple carbohydrates and processed foods, consider a krill or fish or flaxseed oil cap daily.

## 2022-02-24 NOTE — Assessment & Plan Note (Signed)
Fatty liver via ultrasound. Minimize simple carbs and processed foods, stay active

## 2022-02-24 NOTE — Assessment & Plan Note (Signed)
hgba1c acceptable, minimize simple carbs. Increase exercise as tolerated.  

## 2022-02-24 NOTE — Assessment & Plan Note (Addendum)
On Trintellix and manages day to day activities but still notes anhedonia and difficulty concentrating. Will try low dose Ritalin 10 mg po bid prn to see if that helps him complete tasks and manage anxiety. He will stop medicine if any concerns arise and we will reevaluate

## 2022-02-25 ENCOUNTER — Other Ambulatory Visit (HOSPITAL_COMMUNITY): Payer: Self-pay

## 2022-02-25 ENCOUNTER — Telehealth (INDEPENDENT_AMBULATORY_CARE_PROVIDER_SITE_OTHER): Payer: 59 | Admitting: Family Medicine

## 2022-02-25 VITALS — BP 130/70 | Ht 70.0 in | Wt 246.0 lb

## 2022-02-25 DIAGNOSIS — E538 Deficiency of other specified B group vitamins: Secondary | ICD-10-CM | POA: Diagnosis not present

## 2022-02-25 DIAGNOSIS — E782 Mixed hyperlipidemia: Secondary | ICD-10-CM

## 2022-02-25 DIAGNOSIS — E669 Obesity, unspecified: Secondary | ICD-10-CM | POA: Diagnosis not present

## 2022-02-25 DIAGNOSIS — F32A Depression, unspecified: Secondary | ICD-10-CM | POA: Diagnosis not present

## 2022-02-25 DIAGNOSIS — L600 Ingrowing nail: Secondary | ICD-10-CM | POA: Diagnosis not present

## 2022-02-25 DIAGNOSIS — F419 Anxiety disorder, unspecified: Secondary | ICD-10-CM | POA: Diagnosis not present

## 2022-02-25 DIAGNOSIS — R739 Hyperglycemia, unspecified: Secondary | ICD-10-CM | POA: Diagnosis not present

## 2022-02-25 DIAGNOSIS — E559 Vitamin D deficiency, unspecified: Secondary | ICD-10-CM | POA: Diagnosis not present

## 2022-02-25 DIAGNOSIS — R7989 Other specified abnormal findings of blood chemistry: Secondary | ICD-10-CM | POA: Diagnosis not present

## 2022-02-25 DIAGNOSIS — I10 Essential (primary) hypertension: Secondary | ICD-10-CM | POA: Diagnosis not present

## 2022-02-25 MED ORDER — METHYLPHENIDATE HCL 10 MG PO TABS
10.0000 mg | ORAL_TABLET | Freq: Every day | ORAL | 0 refills | Status: DC | PRN
  Filled 2022-02-25: qty 30, 30d supply, fill #0

## 2022-02-27 ENCOUNTER — Encounter: Payer: Self-pay | Admitting: Family Medicine

## 2022-03-01 ENCOUNTER — Other Ambulatory Visit (INDEPENDENT_AMBULATORY_CARE_PROVIDER_SITE_OTHER): Payer: 59

## 2022-03-01 DIAGNOSIS — E538 Deficiency of other specified B group vitamins: Secondary | ICD-10-CM | POA: Diagnosis not present

## 2022-03-01 DIAGNOSIS — I10 Essential (primary) hypertension: Secondary | ICD-10-CM

## 2022-03-01 DIAGNOSIS — E782 Mixed hyperlipidemia: Secondary | ICD-10-CM

## 2022-03-01 DIAGNOSIS — R739 Hyperglycemia, unspecified: Secondary | ICD-10-CM

## 2022-03-01 DIAGNOSIS — E559 Vitamin D deficiency, unspecified: Secondary | ICD-10-CM | POA: Diagnosis not present

## 2022-03-01 LAB — CBC WITH DIFFERENTIAL/PLATELET
Basophils Absolute: 0 10*3/uL (ref 0.0–0.1)
Basophils Relative: 0.3 % (ref 0.0–3.0)
Eosinophils Absolute: 0 10*3/uL (ref 0.0–0.7)
Eosinophils Relative: 0.2 % (ref 0.0–5.0)
HCT: 44.3 % (ref 39.0–52.0)
Hemoglobin: 15.5 g/dL (ref 13.0–17.0)
Lymphocytes Relative: 42.6 % (ref 12.0–46.0)
Lymphs Abs: 4.6 10*3/uL — ABNORMAL HIGH (ref 0.7–4.0)
MCHC: 34.9 g/dL (ref 30.0–36.0)
MCV: 90.6 fl (ref 78.0–100.0)
Monocytes Absolute: 1 10*3/uL (ref 0.1–1.0)
Monocytes Relative: 9.1 % (ref 3.0–12.0)
Neutro Abs: 5.1 10*3/uL (ref 1.4–7.7)
Neutrophils Relative %: 47.8 % (ref 43.0–77.0)
Platelets: 270 10*3/uL (ref 150.0–400.0)
RBC: 4.89 Mil/uL (ref 4.22–5.81)
RDW: 13.2 % (ref 11.5–15.5)
WBC: 10.7 10*3/uL — ABNORMAL HIGH (ref 4.0–10.5)

## 2022-03-01 LAB — COMPREHENSIVE METABOLIC PANEL
ALT: 79 U/L — ABNORMAL HIGH (ref 0–53)
AST: 29 U/L (ref 0–37)
Albumin: 4.3 g/dL (ref 3.5–5.2)
Alkaline Phosphatase: 54 U/L (ref 39–117)
BUN: 15 mg/dL (ref 6–23)
CO2: 30 mEq/L (ref 19–32)
Calcium: 9.2 mg/dL (ref 8.4–10.5)
Chloride: 99 mEq/L (ref 96–112)
Creatinine, Ser: 0.94 mg/dL (ref 0.40–1.50)
GFR: 104.31 mL/min (ref >60.00)
Glucose, Bld: 99 mg/dL (ref 70–99)
Potassium: 3.7 mEq/L (ref 3.5–5.1)
Sodium: 141 mEq/L (ref 135–145)
Total Bilirubin: 0.7 mg/dL (ref 0.2–1.2)
Total Protein: 6.8 g/dL (ref 6.0–8.3)

## 2022-03-01 LAB — VITAMIN B12: Vitamin B-12: 256 pg/mL (ref 211–911)

## 2022-03-01 LAB — LIPID PANEL
Cholesterol: 250 mg/dL — ABNORMAL HIGH (ref 0–200)
HDL: 61.1 mg/dL (ref >39.00)
LDL Cholesterol: 166 mg/dL — ABNORMAL HIGH (ref 0–99)
NonHDL: 188.85
Total CHOL/HDL Ratio: 4
Triglycerides: 116 mg/dL (ref 0.0–149.0)
VLDL: 23.2 mg/dL (ref 0.0–40.0)

## 2022-03-01 LAB — TSH: TSH: 4.22 u[IU]/mL (ref 0.35–5.50)

## 2022-03-01 LAB — VITAMIN D 25 HYDROXY (VIT D DEFICIENCY, FRACTURES): VITD: 21.74 ng/mL — ABNORMAL LOW (ref 30.00–100.00)

## 2022-03-01 LAB — HEMOGLOBIN A1C: Hgb A1c MFr Bld: 6.3 % (ref 4.6–6.5)

## 2022-03-03 ENCOUNTER — Other Ambulatory Visit: Payer: Self-pay | Admitting: Family Medicine

## 2022-03-03 NOTE — Assessment & Plan Note (Signed)
Continues to be symptomatic intermittently. Referred back to podiatry for definite treatment

## 2022-03-03 NOTE — Progress Notes (Signed)
MyChart Video Visit    Virtual Visit via Video Note   This visit type was conducted due to national recommendations for restrictions regarding the COVID-19 Pandemic (e.g. social distancing) in an effort to limit this patient's exposure and mitigate transmission in our community. This patient is at least at moderate risk for complications without adequate follow up. This format is felt to be most appropriate for this patient at this time. Physical exam was limited by quality of the video and audio technology used for the visit. Shamaine, CMA was able to get the patient set up on a video visit.  Patient location: home Patient and provider in visit Provider location: Office  I discussed the limitations of evaluation and management by telemedicine and the availability of in person appointments. The patient expressed understanding and agreed to proceed.  Visit Date: 02/25/2022  Today's healthcare provider: Penni Homans, MD     Subjective:    Patient ID: Nicholas Robertson, male    DOB: [birth year], 37 y.o.   MRN: [id number]  Chief Complaint  Patient presents with   Follow-up    Follow up     HPI Patient is in today for follow up on chronic medical concerns. No recent febile illness or acute concerns. Notes ongoing trouble with anxiety and task completion. Anhedonia at times. Overall is doing better on Trintellix and working from home but feels he could be better. They continue along the adoption journey but no results thus far. His ingrown great toenails continue to be symptomatic at times and he is ready for a referral to podiatry for definitive treatment. Denies CP/palp/SOB/HA/congestion/fevers/GI or GU c/o. Taking meds as prescribed   Past Medical History:  Diagnosis Date   Abnormal LFTs     Anxiety and depression 04/18/2013   Cardiac murmur 12/01/2020   Chest pain 12/01/2020   Chest tightness 10/30/2020   Essential hypertension 02/07/2013   Family history of adverse reaction to  anesthesia    father is hard to wake after anesthesia   Gastroesophageal reflux disease 04/20/2020   Hyperlipidemia 02/07/2013   Ingrown toenail of both feet 07/05/2018   Kidney stones    Neck pain 08/02/2019   Obesity (BMI 30-39.9) 08/02/2019   Obesity (BMI 35.0-39.9 without comorbidity) 12/01/2020   Preventative health care 04/18/2013   Seborrhea 03/08/2019   Vitamin B12 deficiency 09/12/2016   Vitamin D deficiency 03/08/2019   Wolff-Parkinson-White (WPW) syndrome 02/17/2013   S/p ablation.    Past Surgical History:  Procedure Laterality Date   ABLATION  03/09/2013   EPS and RFCA of manifest right posteroloateral accessory pathway   EXTRACORPOREAL SHOCK WAVE LITHOTRIPSY Left 11/08/2019   Procedure: EXTRACORPOREAL SHOCK WAVE LITHOTRIPSY (ESWL);  Surgeon: [name], MD;  Location: [facility];  Service: Urology;  Laterality: Left;   [place]   [facility] [place]   SIGMOIDOSCOPY     SUPRAVENTRICULAR TACHYCARDIA ABLATION N/A 03/08/2013   Procedure: WPW ABLATION;  Surgeon: [name], MD;  Location[facility] C[facility] LAB;  Service: Cardiovascular;  Laterality: N/A;    Family History  Problem Relation Age of Onset   Hypertension Mother    Hyperlipidemia Mother    Mental illness Mother        anxiety   Other Mother        glucose intolerant   Colon polyps Mother    Diabetes Father        2   Hypertension Father    Arthritis  Father    Colon polyps Father    Psoriasis Maternal Grandmother    Hypertension Maternal Grandmother    Cancer Maternal Grandmother        lymphoma   Hyperlipidemia Maternal Grandfather    Heart disease Maternal Grandfather        cabg   Hypertension Maternal Grandfather    Polymyalgia rheumatica Maternal Grandfather    Hypertension Paternal Grandmother    Diabetes Paternal Grandmother        2   COPD Paternal Grandmother    Heart disease Paternal Grandfather        s/p MI, s/p CABG    Hyperlipidemia Paternal Grandfather    Hypertension Paternal Grandfather    Diabetes Paternal Grandfather        1   Colon cancer Neg Hx    Esophageal cancer Neg Hx    Rectal cancer Neg Hx    Stomach cancer Neg Hx     Social History   Socioeconomic History   Marital status: Married    Spouse name: Not on file   Number of children: Not on file   Years of education: Not on file   Highest education level: Not on file  Occupational History   Not on file  Tobacco Use   Smoking status: Never   Smokeless tobacco: Never  Vaping Use   Vaping Use: Never used  Substance and Sexual Activity   Alcohol use: No   Drug use: No   Sexual activity: Yes    Comment: no dietary restricitons, lives alone with cat, seat belts  Other Topics Concern   Not on file  Social History Narrative   Works at [employer], as [place] with partner   No major dietary restrictions.    Pet: cat, dog   Social Determinants of Health   Financial Resource Strain: Not on file  Food Insecurity: Not on file  Transportation Needs: Not on file  Physical Activity: Not on file  Stress: Not on file  Social Connections: Not on file  Intimate Partner Violence: Not on file    Outpatient Medications Prior to Visit  Medication Sig Dispense Refill   amLODipine (NORVASC) 5 MG tablet TAKE 1 TABLET BY MOUTH ONCE A DAY 90 tablet 3   cyanocobalamin 1000 MCG tablet Take 1,000 mcg by mouth daily.     doxycycline (VIBRA-TABS) 100 MG tablet Take 1 tablet (100 mg total) by mouth 2 (two) times daily for 10 days. 20 tablet 0   famotidine (PEPCID) 40 MG tablet TAKE 1 TABLET BY MOUTH ONCE A DAY 90 tablet 1   metoprolol succinate (TOPROL-XL) 50 MG 24 hr tablet Take 1 tablet (50 mg total) by mouth daily with or immediately following a meal. 90 tablet 3   olmesartan-hydrochlorothiazide (BENICAR HCT) 40-25 MG tablet Take 1 tablet by mouth daily. 90 tablet 1   pantoprazole (PROTONIX) 40 MG tablet TAKE 1 TABLET (40 MG TOTAL) BY  MOUTH DAILY. 90 tablet 1   predniSONE (DELTASONE) 50 MG tablet Take 1 tablet (50 mg total) by mouth daily with breakfast for 5 days. 5 tablet 0   vortioxetine HBr (TRINTELLIX) 20 MG TABS tablet Take 1 tablet (20 mg total) by mouth daily. 90 tablet 1   Semaglutide-Weight Management (WEGOVY) 1 MG/0.5ML SOAJ Inject 1 mg into the skin once a week. 2 mL 1   No facility-administered medications prior to visit.    No Known Allergies  Review of Systems  Constitutional:  Negative for  fever and malaise/fatigue.  HENT:  Negative for congestion.   Eyes:  Negative for blurred vision.  Respiratory:  Negative for shortness of breath.   Cardiovascular:  Negative for chest pain, palpitations and leg swelling.  Gastrointestinal:  Negative for abdominal pain, blood in stool and nausea.  Genitourinary:  Negative for dysuria and frequency.  Musculoskeletal:  Positive for joint pain. Negative for falls.  Skin:  Negative for rash.  Neurological:  Negative for dizziness, loss of consciousness and headaches.  Endo/Heme/Allergies:  Negative for environmental allergies.  Psychiatric/Behavioral:  Positive for depression. Negative for hallucinations, substance abuse and suicidal ideas. The patient is nervous/anxious.        Objective:    Physical Exam Constitutional:      General: He is not in acute distress.    Appearance: Normal appearance. He is not ill-appearing or toxic-appearing.  HENT:     Head: Normocephalic and atraumatic.     Right Ear: External ear normal.     Left Ear: External ear normal.     Nose: Nose normal.  Eyes:     General:        Right eye: No discharge.        Left eye: No discharge.  Pulmonary:     Effort: Pulmonary effort is normal.  Skin:    Findings: No rash.  Neurological:     Mental Status: He is alert and oriented to person, place, and time.  Psychiatric:        Behavior: Behavior normal.     BP 130/70 (BP Location: Right Arm, Patient Position: Sitting, Cuff Size:  Normal)   Ht '5\' 10"'$  (1.778 m)   Wt 246 lb (111.6 kg)   BMI 35.30 kg/m  Wt Readings from Last 3 Encounters:  02/25/22 246 lb (111.6 kg)  11/01/21 248 lb (112.5 kg)  08/27/21 249 lb (112.9 kg)       Assessment & Plan:  Mixed hyperlipidemia Assessment & Plan: Did not tolerate Atorvastatin. Consider low dose Rosuvastatin. Encourage heart healthy diet such as MIND or DASH diet, increase exercise, avoid trans fats, simple carbohydrates and processed foods, consider a krill or fish or flaxseed oil cap daily.   Orders: -     Lipid panel; Future  Hyperglycemia Assessment & Plan: hgba1c acceptable, minimize simple carbs. Increase exercise as tolerated  Orders: -     Hemoglobin A1c; Future  Obesity (BMI 30-39.9) Assessment & Plan: Encouraged DASH or MIND diet, decrease po intake and increase exercise as tolerated. Needs 7-8 hours of sleep nightly. Avoid trans fats, eat small, frequent meals every 4-5 hours with lean proteins, complex carbs and healthy fats. Minimize simple carbs, high fat foods and processed foods    Vitamin B12 deficiency Assessment & Plan: Supplement and monitor  Orders: -     Vitamin B12; Future  Vitamin D deficiency Assessment & Plan: Supplement and monitor  Orders: -     VITAMIN D 25 Hydroxy (Vit-D Deficiency, Fractures); Future  Abnormal LFTs Assessment & Plan: Fatty liver via ultrasound. Minimize simple carbs and processed foods, stay active   Essential hypertension Assessment & Plan: Well controlled, no changes to meds. Encouraged heart healthy diet such as the DASH diet and exercise as tolerated  Orders: -     CBC with Differential/Platelet; Future -     Comprehensive metabolic panel; Future -     TSH; Future  Anxiety and depression Assessment & Plan: On Trintellix and manages day to day activities but still notes anhedonia and  difficulty concentrating. Will try low dose Ritalin 10 mg po bid prn to see if that helps him complete tasks and  manage anxiety. He will stop medicine if any concerns arise and we will reevaluate   Ingrown nail of great toe Assessment & Plan: Continues to be symptomatic intermittently. Referred back to podiatry for definite treatment  Orders: -     Ambulatory referral to Podiatry  Other orders -     Methylphenidate HCl; Take 1 tablet (10 mg total) by mouth daily as needed.  Dispense: 30 tablet; Refill: 0     I discussed the assessment and treatment plan with the patient. The patient was provided an opportunity to ask questions and all were answered. The patient agreed with the plan and demonstrated an understanding of the instructions.   The patient was advised to call back or seek an in-person evaluation if the symptoms worsen or if the condition fails to improve as anticipated.  Penni Homans, MD [facility] [place] (phone) [phone] (fax)  [place]

## 2022-03-04 ENCOUNTER — Telehealth: Payer: Self-pay

## 2022-03-04 ENCOUNTER — Other Ambulatory Visit (HOSPITAL_COMMUNITY): Payer: Self-pay

## 2022-03-04 ENCOUNTER — Other Ambulatory Visit: Payer: Self-pay

## 2022-03-04 MED ORDER — VITAMIN D (ERGOCALCIFEROL) 1.25 MG (50000 UNIT) PO CAPS
50000.0000 [IU] | ORAL_CAPSULE | ORAL | 3 refills | Status: DC
  Filled 2022-03-04: qty 5, 35d supply, fill #0
  Filled 2022-04-07: qty 5, 35d supply, fill #1
  Filled 2022-05-05: qty 5, 35d supply, fill #2
  Filled 2022-06-19: qty 5, 35d supply, fill #3

## 2022-03-04 MED ORDER — ROSUVASTATIN CALCIUM 5 MG PO TABS
5.0000 mg | ORAL_TABLET | Freq: Every day | ORAL | 3 refills | Status: DC
  Filled 2022-03-04: qty 90, 90d supply, fill #0
  Filled 2022-05-29: qty 90, 90d supply, fill #1
  Filled 2022-07-05 – 2022-10-05 (×2): qty 90, 90d supply, fill #2
  Filled 2023-01-12 – 2023-01-13 (×2): qty 90, 90d supply, fill #3

## 2022-03-04 NOTE — Telephone Encounter (Signed)
done 

## 2022-03-05 ENCOUNTER — Other Ambulatory Visit: Payer: Self-pay

## 2022-03-08 ENCOUNTER — Ambulatory Visit: Payer: 59 | Admitting: Podiatry

## 2022-03-08 ENCOUNTER — Encounter: Payer: Self-pay | Admitting: Podiatry

## 2022-03-08 DIAGNOSIS — L600 Ingrowing nail: Secondary | ICD-10-CM

## 2022-03-08 NOTE — Patient Instructions (Signed)

## 2022-03-08 NOTE — Progress Notes (Signed)
Subjective:   Patient ID: Nicholas Robertson, male   DOB: 37 y.o.   MRN: [id number]   HPI Patient presents with discomfort of the left big toenail medial border with spicule formation with procedure that was done 4 years ago   ROS      Objective:  Physical Exam  Neurovascular status intact with spicule formation left big toenail medial border painful when pressed with irritation if it catches socks or other instruments     Assessment:  Abnormal medial border left hallux with spicule formation     Plan:  H&P discussed recommended removal of the border explained procedure risk patient wants procedure infiltrated 60 mg like Marcaine mixture sterile prep done using sterile instrumentation remove the medial border exposed matrix applied phenol 3 applications 30 seconds followed by alcohol lavage gave instructions on soaks reappoint to recheck

## 2022-03-10 ENCOUNTER — Other Ambulatory Visit: Payer: Self-pay | Admitting: Family Medicine

## 2022-03-10 DIAGNOSIS — F419 Anxiety disorder, unspecified: Secondary | ICD-10-CM

## 2022-03-11 ENCOUNTER — Other Ambulatory Visit: Payer: Self-pay

## 2022-03-11 ENCOUNTER — Other Ambulatory Visit (HOSPITAL_COMMUNITY): Payer: Self-pay

## 2022-03-11 MED ORDER — VORTIOXETINE HBR 20 MG PO TABS
20.0000 mg | ORAL_TABLET | Freq: Every day | ORAL | 1 refills | Status: DC
  Filled 2022-03-11: qty 90, 90d supply, fill #0
  Filled 2022-06-03 – 2022-06-19 (×2): qty 90, 90d supply, fill #1

## 2022-03-11 MED ORDER — PANTOPRAZOLE SODIUM 40 MG PO TBEC
40.0000 mg | DELAYED_RELEASE_TABLET | Freq: Every day | ORAL | 1 refills | Status: DC
  Filled 2022-03-11: qty 90, 90d supply, fill #0
  Filled 2022-06-19: qty 90, 90d supply, fill #1

## 2022-03-11 MED ORDER — AMLODIPINE BESYLATE 5 MG PO TABS
5.0000 mg | ORAL_TABLET | Freq: Every day | ORAL | 3 refills | Status: DC
  Filled 2022-03-11: qty 90, 90d supply, fill #0
  Filled 2022-06-03 – 2022-06-19 (×2): qty 90, 90d supply, fill #1
  Filled 2022-09-02: qty 90, 90d supply, fill #2
  Filled 2022-12-09: qty 90, 90d supply, fill #3

## 2022-03-11 MED ORDER — FAMOTIDINE 40 MG PO TABS
40.0000 mg | ORAL_TABLET | Freq: Every day | ORAL | 1 refills | Status: DC
  Filled 2022-03-11: qty 90, 90d supply, fill #0
  Filled 2022-06-03 – 2022-06-19 (×2): qty 90, 90d supply, fill #1

## 2022-03-19 ENCOUNTER — Telehealth: Payer: 59 | Admitting: Family Medicine

## 2022-04-08 ENCOUNTER — Other Ambulatory Visit: Payer: Self-pay

## 2022-04-14 NOTE — Assessment & Plan Note (Signed)
Supplement and monitor is taking Vitamin D3 3000   mg daily reassess at next blood draw

## 2022-04-14 NOTE — Assessment & Plan Note (Addendum)
Did not tolerate Atorvastatin. Tolerating Rosuvastatin. Encourage heart healthy diet such as MIND or DASH diet, increase exercise, avoid trans fats, simple carbohydrates and processed foods, consider a krill or fish or flaxseed oil cap daily.

## 2022-04-14 NOTE — Assessment & Plan Note (Signed)
Avoid offending foods, start probiotics. Do not eat large meals in late evening and consider raising head of bed.  Take Famotidine, pantoprazole

## 2022-04-14 NOTE — Assessment & Plan Note (Signed)
Add Ritalin at last visit to help with focus and fatigue

## 2022-04-14 NOTE — Assessment & Plan Note (Signed)
hgba1c acceptable, minimize simple carbs. Increase exercise as tolerated.  

## 2022-04-14 NOTE — Assessment & Plan Note (Signed)
Supplement and monitor 

## 2022-04-15 ENCOUNTER — Other Ambulatory Visit: Payer: Self-pay

## 2022-04-15 ENCOUNTER — Other Ambulatory Visit (HOSPITAL_COMMUNITY): Payer: Self-pay

## 2022-04-15 ENCOUNTER — Telehealth (INDEPENDENT_AMBULATORY_CARE_PROVIDER_SITE_OTHER): Payer: 59 | Admitting: Family Medicine

## 2022-04-15 VITALS — BP 132/82 | Resp 16 | Ht 70.0 in | Wt 245.0 lb

## 2022-04-15 DIAGNOSIS — F419 Anxiety disorder, unspecified: Secondary | ICD-10-CM | POA: Diagnosis not present

## 2022-04-15 DIAGNOSIS — E782 Mixed hyperlipidemia: Secondary | ICD-10-CM | POA: Diagnosis not present

## 2022-04-15 DIAGNOSIS — E559 Vitamin D deficiency, unspecified: Secondary | ICD-10-CM

## 2022-04-15 DIAGNOSIS — F32A Depression, unspecified: Secondary | ICD-10-CM

## 2022-04-15 DIAGNOSIS — L600 Ingrowing nail: Secondary | ICD-10-CM

## 2022-04-15 DIAGNOSIS — R739 Hyperglycemia, unspecified: Secondary | ICD-10-CM | POA: Diagnosis not present

## 2022-04-15 DIAGNOSIS — K219 Gastro-esophageal reflux disease without esophagitis: Secondary | ICD-10-CM

## 2022-04-15 DIAGNOSIS — E538 Deficiency of other specified B group vitamins: Secondary | ICD-10-CM | POA: Diagnosis not present

## 2022-04-15 DIAGNOSIS — R41840 Attention and concentration deficit: Secondary | ICD-10-CM

## 2022-04-15 MED ORDER — METHYLPHENIDATE HCL 10 MG PO TABS
10.0000 mg | ORAL_TABLET | Freq: Two times a day (BID) | ORAL | 0 refills | Status: DC | PRN
  Filled 2022-04-15: qty 60, 30d supply, fill #0

## 2022-04-15 NOTE — Progress Notes (Signed)
MyChart Video Visit    Virtual Visit via Video Note    This format is felt to be most appropriate for this patient at this time. Physical exam was limited by quality of the video and audio technology used for the visit. Shamaine, CMAwas able to get the patient set up on a video visit.  Patient location:  Patient and provider in visit Provider location: Office  I discussed the limitations of evaluation and management by telemedicine and the availability of in person appointments. The patient expressed understanding and agreed to proceed.  Visit Date: 04/15/2022  Today's healthcare provider: Penni Homans, MD     Subjective:    Patient ID: Nicholas Robertson, male    DOB: July 13, 1985, 37 y.o.   MRN: [id number]  Chief Complaint  Patient presents with   Follow-up    Follow up    HPI Patient is in todaled. Encouraged heart healthy diet such as the DASH diet and exercise as tolerated.  follow up on chronic medical concerns. No recent febrile illness or hospitalizations. Denies CP/palp/SOB/HA/congestion/fevers/GI or GU c/o. Taking meds as prescribed  Past Medical History:  Diagnosis Date   Abnormal LFTs     Anxiety and depression 04/18/2013   Cardiac murmur 12/01/2020   Chest pain 12/01/2020   Chest tightness 10/30/2020   Essential hypertension 02/07/2013   Family history of adverse reaction to anesthesia    father is hard to wake after anesthesia   Gastroesophageal reflux disease 04/20/2020   Hyperlipidemia 02/07/2013   Ingrown toenail of both feet 07/05/2018   Kidney stones    Neck pain 08/02/2019   Obesity (BMI 30-39.9) 08/02/2019   Obesity (BMI 35.0-39.9 without comorbidity) 12/01/2020   Preventative health care 04/18/2013   Seborrhea 03/08/2019   Vitamin B12 deficiency 09/12/2016   Vitamin D deficiency 03/08/2019   Wolff-Parkinson-White (WPW) syndrome 02/17/2013   S/p ablation.    Past Surgical History:  Procedure Laterality Date   ABLATION  03/09/2013   EPS and  RFCA of manifest right posteroloateral accessory pathway   EXTRACORPOREAL SHOCK WAVE LITHOTRIPSY Left 11/08/2019   Procedure: EXTRACORPOREAL SHOCK WAVE LITHOTRIPSY (ESWL);  Surgeon: [name], MD;  Location[facility];  Service: Urology;  Laterality: Left;   [place]   [facility] [place]   SIGMOIDOSCOPY     SUPRAVENTRICULAR TACHYCARDIA ABLATION N/A 03/08/2013   Procedure: WPW ABLATION;  Surgeon: [name], MD;  Location: [facility] CATH LAB;  Service: Cardiovascular;  Laterality: N/A;    Family History  Problem Relation Age of Onset   Hypertension Mother    Hyperlipidemia Mother    Mental illness Mother        anxiety   Other Mother        glucose intolerant   Colon polyps Mother    Diabetes Father        2   Hypertension Father    Arthritis Father    Colon polyps Father    Psoriasis Maternal Grandmother    Hypertension Maternal Grandmother    Cancer Maternal Grandmother        lymphoma   Hyperlipidemia Maternal Grandfather    Heart disease Maternal Grandfather        cabg   Hypertension Maternal Grandfather    Polymyalgia rheumatica Maternal Grandfather    Hypertension Paternal Grandmother    Diabetes Paternal Grandmother        2   COPD Paternal Grandmother    Heart disease Paternal  Grandfather        s/p MI, s/p CABG   Hyperlipidemia Paternal Grandfather    Hypertension Paternal Grandfather    Diabetes Paternal Grandfather        1   Colon cancer Neg Hx    Esophageal cancer Neg Hx    Rectal cancer Neg Hx    Stomach cancer Neg Hx     Social History   Socioeconomic History   Marital status: Married    Spouse name: Not on file   Number of children: Not on file   Years of education: Not on file   Highest education level: Not on file  Occupational History   Not on file  Tobacco Use   Smoking status: Never   Smokeless tobacco: Never  Vaping Use   Vaping Use: Never used  Substance and Sexual  Activity   Alcohol use: No   Drug use: No   Sexual activity: Yes    Comment: no dietary restricitons, lives alone with cat, seat belts  Other Topics Concern   Not on file  Social History Narrative   Works at [employer], as [place]   Lives with partner   No major dietary restrictions.    Pet: cat, dog   Social Determinants of Health   Financial Resource Strain: Not on file  Food Insecurity: Not on file  Transportation Needs: Not on file  Physical Activity: Not on file  Stress: Not on file  Social Connections: Not on file  Intimate Partner Violence: Not on file    Outpatient Medications Prior to Visit  Medication Sig Dispense Refill   rosuvastatin (CRESTOR) 5 MG tablet Take 1 tablet (5 mg total) by mouth daily. 90 tablet 3   Vitamin D, Ergocalciferol, (DRISDOL) 1.25 MG (50000 UNIT) CAPS capsule Take 1 capsule (50,000 Units total) by mouth every 7 (seven) days. 5 capsule 3   amLODipine (NORVASC) 5 MG tablet Take 1 tablet (5 mg total) by mouth daily. 90 tablet 3   cyanocobalamin 1000 MCG tablet Take 1,000 mcg by mouth daily.     famotidine (PEPCID) 40 MG tablet Take 1 tablet (40 mg total) by mouth daily. 90 tablet 1   metoprolol succinate (TOPROL-XL) 50 MG 24 hr tablet Take 1 tablet (50 mg total) by mouth daily with or immediately following a meal. 90 tablet 3   olmesartan-hydrochlorothiazide (BENICAR HCT) 40-25 MG tablet Take 1 tablet by mouth daily. 90 tablet 1   pantoprazole (PROTONIX) 40 MG tablet Take 1 tablet (40 mg total) by mouth daily. 90 tablet 1   vortioxetine HBr (TRINTELLIX) 20 MG TABS tablet Take 1 tablet (20 mg total) by mouth daily. 90 tablet 1   methylphenidate (RITALIN) 10 MG tablet Take 1 tablet (10 mg total) by mouth daily as needed. 30 tablet 0   No facility-administered medications prior to visit.    No Known Allergies  Review of Systems  Constitutional:  Positive for malaise/fatigue. Negative for fever.  HENT:  Negative for congestion.   Eyes:  Negative  for blurred vision.  Respiratory:  Negative for shortness of breath.   Cardiovascular:  Negative for chest pain, palpitations and leg swelling.  Gastrointestinal:  Negative for abdominal pain, blood in stool and nausea.  Genitourinary:  Negative for dysuria and frequency.  Musculoskeletal:  Negative for falls.  Skin:  Negative for rash.  Neurological:  Negative for dizziness, loss of consciousness and headaches.  Endo/Heme/Allergies:  Negative for environmental allergies.  Psychiatric/Behavioral:  Negative for depression. The patient  is not nervous/anxious.        Objective:    Physical Exam Constitutional:      General: He is not in acute distress.    Appearance: Normal appearance. He is not ill-appearing or toxic-appearing.  HENT:     Head: Normocephalic and atraumatic.     Right Ear: External ear normal.     Left Ear: External ear normal.     Nose: Nose normal.  Eyes:     General:        Right eye: No discharge.        Left eye: No discharge.  Pulmonary:     Effort: Pulmonary effort is normal.  Skin:    Findings: No rash.  Neurological:     Mental Status: He is alert and oriented to person, place, and time.  Psychiatric:        Behavior: Behavior normal.     BP 132/82 (BP Location: Right Arm, Patient Position: Sitting, Cuff Size: Normal)   Resp 16   Ht 5\' 10"  (1.778 m)   Wt 245 lb (111.1 kg)   BMI 35.15 kg/m  Wt Readings from Last 3 Encounters:  04/15/22 245 lb (111.1 kg)  02/25/22 246 lb (111.6 kg)  11/01/21 248 lb (112.5 kg)       Assessment & Plan:  Hyperglycemia Assessment & Plan: hgba1c acceptable, minimize simple carbs. Increase exercise as tolerated   Mixed hyperlipidemia Assessment & Plan: Did not tolerate Atorvastatin. Tolerating Rosuvastatin. Encourage heart healthy diet such as MIND or DASH diet, increase exercise, avoid trans fats, simple carbohydrates and processed foods, consider a krill or fish or flaxseed oil cap daily.    Vitamin D  deficiency Assessment & Plan: Supplement and monitor is taking Vitamin D3 3000   mg daily reassess at next blood draw   Vitamin B12 deficiency Assessment & Plan: Supplement and monitor    Anxiety and depression Assessment & Plan: Add Ritalin at last visit to help with focus and fatigue   Gastroesophageal reflux disease without esophagitis Assessment & Plan: Avoid offending foods, start probiotics. Do not eat large meals in late evening and consider raising head of bed.  Take Famotidine, pantoprazole    Attention or concentration deficit Assessment & Plan: He notes on the days he takes the low dose Ritalin he can concentrate better and finishes more tasks which ultimately results in less anxiety and a better work flow. He denies any concerning side effects from the meds such as HA or palpitations. His blood pressure and pulse have been steady. He is referred to Behavioral health for official diagnosis and Ritalin is increased to 10 mg po bid prn  Orders: -     Ambulatory referral to [place] nail of great toe Assessment & Plan: Has been seen by podiatry and final remnant of toenail was removed   Other orders -     Methylphenidate HCl; Take 1 tablet (10 mg total) by mouth 2 (two) times daily as needed.  Dispense: 60 tablet; Refill: 0     I discussed the assessment and treatment plan with the patient. The patient was provided an opportunity to ask questions and all were answered. The patient agreed with the plan and demonstrated an understanding of the instructions.   The patient was advised to call back or seek an in-person evaluation if the symptoms worsen or if the condition fails to improve as anticipated.  Penni Homans, MD [facility] [address]  [phone] (phone) [phone] (fax)  [place]

## 2022-04-15 NOTE — Assessment & Plan Note (Signed)
He notes on the days he takes the low dose Ritalin he can concentrate better and finishes more tasks which ultimately results in less anxiety and a better work flow. He denies any concerning side effects from the meds such as HA or palpitations. His blood pressure and pulse have been steady. He is referred to Behavioral health for official diagnosis and Ritalin is increased to 10 mg po bid prn

## 2022-04-15 NOTE — Assessment & Plan Note (Signed)
Has been seen by podiatry and final remnant of toenail was removed

## 2022-04-16 ENCOUNTER — Telehealth: Payer: Self-pay

## 2022-04-16 NOTE — Telephone Encounter (Signed)
done

## 2022-04-29 ENCOUNTER — Telehealth: Payer: 59 | Admitting: Family Medicine

## 2022-05-03 ENCOUNTER — Other Ambulatory Visit: Payer: 59

## 2022-05-05 ENCOUNTER — Other Ambulatory Visit: Payer: Self-pay | Admitting: Family Medicine

## 2022-05-06 ENCOUNTER — Other Ambulatory Visit: Payer: Self-pay

## 2022-05-06 ENCOUNTER — Other Ambulatory Visit (HOSPITAL_COMMUNITY): Payer: Self-pay

## 2022-05-06 MED ORDER — OLMESARTAN MEDOXOMIL-HCTZ 40-25 MG PO TABS
1.0000 | ORAL_TABLET | Freq: Every day | ORAL | 1 refills | Status: DC
  Filled 2022-05-06: qty 90, 90d supply, fill #0

## 2022-05-29 ENCOUNTER — Other Ambulatory Visit (HOSPITAL_COMMUNITY): Payer: Self-pay

## 2022-05-31 ENCOUNTER — Telehealth: Payer: Self-pay | Admitting: *Deleted

## 2022-05-31 ENCOUNTER — Other Ambulatory Visit: Payer: Self-pay | Admitting: Family Medicine

## 2022-05-31 ENCOUNTER — Other Ambulatory Visit: Payer: 59

## 2022-05-31 ENCOUNTER — Other Ambulatory Visit (INDEPENDENT_AMBULATORY_CARE_PROVIDER_SITE_OTHER): Payer: 59

## 2022-05-31 DIAGNOSIS — R739 Hyperglycemia, unspecified: Secondary | ICD-10-CM | POA: Diagnosis not present

## 2022-05-31 DIAGNOSIS — E538 Deficiency of other specified B group vitamins: Secondary | ICD-10-CM

## 2022-05-31 DIAGNOSIS — I10 Essential (primary) hypertension: Secondary | ICD-10-CM | POA: Diagnosis not present

## 2022-05-31 DIAGNOSIS — E559 Vitamin D deficiency, unspecified: Secondary | ICD-10-CM

## 2022-05-31 DIAGNOSIS — R7989 Other specified abnormal findings of blood chemistry: Secondary | ICD-10-CM

## 2022-05-31 LAB — CBC WITH DIFFERENTIAL/PLATELET
Basophils Absolute: 0.1 10*3/uL (ref 0.0–0.1)
Basophils Relative: 1.1 % (ref 0.0–3.0)
Eosinophils Absolute: 0.1 10*3/uL (ref 0.0–0.7)
Eosinophils Relative: 1.4 % (ref 0.0–5.0)
HCT: 46 % (ref 39.0–52.0)
Hemoglobin: 16.1 g/dL (ref 13.0–17.0)
Lymphocytes Relative: 33.7 % (ref 12.0–46.0)
Lymphs Abs: 2.6 10*3/uL (ref 0.7–4.0)
MCHC: 35 g/dL (ref 30.0–36.0)
MCV: 91 fl (ref 78.0–100.0)
Monocytes Absolute: 0.7 10*3/uL (ref 0.1–1.0)
Monocytes Relative: 8.7 % (ref 3.0–12.0)
Neutro Abs: 4.2 10*3/uL (ref 1.4–7.7)
Neutrophils Relative %: 55.1 % (ref 43.0–77.0)
Platelets: 236 10*3/uL (ref 150.0–400.0)
RBC: 5.06 Mil/uL (ref 4.22–5.81)
RDW: 13.2 % (ref 11.5–15.5)
WBC: 7.7 10*3/uL (ref 4.0–10.5)

## 2022-05-31 LAB — COMPREHENSIVE METABOLIC PANEL
ALT: 101 U/L — ABNORMAL HIGH (ref 0–53)
AST: 53 U/L — ABNORMAL HIGH (ref 0–37)
Albumin: 4.5 g/dL (ref 3.5–5.2)
Alkaline Phosphatase: 57 U/L (ref 39–117)
BUN: 12 mg/dL (ref 6–23)
CO2: 28 mEq/L (ref 19–32)
Calcium: 9.5 mg/dL (ref 8.4–10.5)
Chloride: 100 mEq/L (ref 96–112)
Creatinine, Ser: 0.9 mg/dL (ref 0.40–1.50)
GFR: 109.71 mL/min (ref >60.00)
Glucose, Bld: 115 mg/dL — ABNORMAL HIGH (ref 70–99)
Potassium: 3.8 mEq/L (ref 3.5–5.1)
Sodium: 139 mEq/L (ref 135–145)
Total Bilirubin: 0.8 mg/dL (ref 0.2–1.2)
Total Protein: 7.3 g/dL (ref 6.0–8.3)

## 2022-05-31 LAB — HEMOGLOBIN A1C: Hgb A1c MFr Bld: 6.2 % (ref 4.6–6.5)

## 2022-05-31 LAB — VITAMIN B12: Vitamin B-12: 209 pg/mL — ABNORMAL LOW (ref 211–911)

## 2022-05-31 LAB — VITAMIN D 25 HYDROXY (VIT D DEFICIENCY, FRACTURES): VITD: 30.13 ng/mL (ref 30.00–100.00)

## 2022-05-31 LAB — TSH: TSH: 2.66 u[IU]/mL (ref 0.35–5.50)

## 2022-05-31 NOTE — Telephone Encounter (Signed)
Pt came in for lab appointment this morning.  No future orders in EPIC.  Per last lab note it looks like we should be checking vit D, lipid panel.  Pt also mentioned LFTs for ?med monitoring and CBC due to recently elevated WBC.  Please place appropriate future orders.  Thank you!

## 2022-05-31 NOTE — Telephone Encounter (Signed)
I released all the orders that were placed as future and sent what I had.  Hopefully it can all be run on the sample I drew.

## 2022-06-02 LAB — INTRINSIC FACTOR ANTIBODIES: Intrinsic Factor: NEGATIVE

## 2022-06-03 ENCOUNTER — Other Ambulatory Visit: Payer: Self-pay | Admitting: Family Medicine

## 2022-06-04 ENCOUNTER — Other Ambulatory Visit: Payer: Self-pay

## 2022-06-04 MED ORDER — METHYLPHENIDATE HCL 10 MG PO TABS
10.0000 mg | ORAL_TABLET | Freq: Two times a day (BID) | ORAL | 0 refills | Status: DC | PRN
  Filled 2022-06-04: qty 60, 30d supply, fill #0

## 2022-06-04 NOTE — Telephone Encounter (Signed)
Requesting: Ritalin 10mg   Contract: None UDS: None Last Visit: 04/15/22 Next Visit: 08/15/22 Last Refill: 04/15/22 #60 and 0RF  Please Advise

## 2022-06-05 ENCOUNTER — Other Ambulatory Visit: Payer: Self-pay

## 2022-06-19 ENCOUNTER — Other Ambulatory Visit (HOSPITAL_COMMUNITY): Payer: Self-pay

## 2022-07-06 ENCOUNTER — Other Ambulatory Visit (HOSPITAL_COMMUNITY): Payer: Self-pay

## 2022-07-08 ENCOUNTER — Other Ambulatory Visit: Payer: Self-pay

## 2022-08-02 ENCOUNTER — Other Ambulatory Visit: Payer: 59

## 2022-08-14 ENCOUNTER — Telehealth: Payer: Self-pay | Admitting: *Deleted

## 2022-08-14 ENCOUNTER — Other Ambulatory Visit (INDEPENDENT_AMBULATORY_CARE_PROVIDER_SITE_OTHER): Payer: 59

## 2022-08-14 ENCOUNTER — Other Ambulatory Visit: Payer: Self-pay | Admitting: Family Medicine

## 2022-08-14 ENCOUNTER — Other Ambulatory Visit: Payer: Self-pay

## 2022-08-14 DIAGNOSIS — I10 Essential (primary) hypertension: Secondary | ICD-10-CM

## 2022-08-14 DIAGNOSIS — E538 Deficiency of other specified B group vitamins: Secondary | ICD-10-CM | POA: Diagnosis not present

## 2022-08-14 DIAGNOSIS — E559 Vitamin D deficiency, unspecified: Secondary | ICD-10-CM

## 2022-08-14 DIAGNOSIS — E782 Mixed hyperlipidemia: Secondary | ICD-10-CM

## 2022-08-14 DIAGNOSIS — R739 Hyperglycemia, unspecified: Secondary | ICD-10-CM | POA: Diagnosis not present

## 2022-08-14 LAB — COMPREHENSIVE METABOLIC PANEL
ALT: 79 U/L — ABNORMAL HIGH (ref 0–53)
AST: 41 U/L — ABNORMAL HIGH (ref 0–37)
Albumin: 4.5 g/dL (ref 3.5–5.2)
Alkaline Phosphatase: 60 U/L (ref 39–117)
BUN: 11 mg/dL (ref 6–23)
CO2: 28 mEq/L (ref 19–32)
Calcium: 9.7 mg/dL (ref 8.4–10.5)
Chloride: 103 mEq/L (ref 96–112)
Creatinine, Ser: 0.9 mg/dL (ref 0.40–1.50)
GFR: 109.55 mL/min (ref >60.00)
Glucose, Bld: 113 mg/dL — ABNORMAL HIGH (ref 70–99)
Potassium: 3.7 mEq/L (ref 3.5–5.1)
Sodium: 142 mEq/L (ref 135–145)
Total Bilirubin: 0.8 mg/dL (ref 0.2–1.2)
Total Protein: 6.8 g/dL (ref 6.0–8.3)

## 2022-08-14 LAB — TSH: TSH: 2.89 u[IU]/mL (ref 0.35–5.50)

## 2022-08-14 LAB — VITAMIN B12: Vitamin B-12: 297 pg/mL (ref 211–911)

## 2022-08-14 LAB — LIPID PANEL
Cholesterol: 173 mg/dL (ref 0–200)
HDL: 48.5 mg/dL (ref >39.00)
LDL Cholesterol: 106 mg/dL — ABNORMAL HIGH (ref 0–99)
NonHDL: 124.37
Total CHOL/HDL Ratio: 4
Triglycerides: 94 mg/dL (ref 0.0–149.0)
VLDL: 18.8 mg/dL (ref 0.0–40.0)

## 2022-08-14 LAB — CBC WITH DIFFERENTIAL/PLATELET
Basophils Absolute: 0 10*3/uL (ref 0.0–0.1)
Basophils Relative: 0.5 % (ref 0.0–3.0)
Eosinophils Absolute: 0.1 10*3/uL (ref 0.0–0.7)
Eosinophils Relative: 1.5 % (ref 0.0–5.0)
HCT: 46 % (ref 39.0–52.0)
Hemoglobin: 15.7 g/dL (ref 13.0–17.0)
Lymphocytes Relative: 39 % (ref 12.0–46.0)
Lymphs Abs: 2.6 10*3/uL (ref 0.7–4.0)
MCHC: 34.1 g/dL (ref 30.0–36.0)
MCV: 91.3 fl (ref 78.0–100.0)
Monocytes Absolute: 0.6 10*3/uL (ref 0.1–1.0)
Monocytes Relative: 9.5 % (ref 3.0–12.0)
Neutro Abs: 3.3 10*3/uL (ref 1.4–7.7)
Neutrophils Relative %: 49.5 % (ref 43.0–77.0)
Platelets: 236 10*3/uL (ref 150.0–400.0)
RBC: 5.04 Mil/uL (ref 4.22–5.81)
RDW: 12.9 % (ref 11.5–15.5)
WBC: 6.6 10*3/uL (ref 4.0–10.5)

## 2022-08-14 LAB — HEMOGLOBIN A1C: Hgb A1c MFr Bld: 6.3 % (ref 4.6–6.5)

## 2022-08-14 LAB — VITAMIN D 25 HYDROXY (VIT D DEFICIENCY, FRACTURES): VITD: 41.06 ng/mL (ref 30.00–100.00)

## 2022-08-14 NOTE — Assessment & Plan Note (Signed)
 Well controlled, no changes to meds. Encouraged heart healthy diet such as the DASH diet and exercise as tolerated.  °

## 2022-08-14 NOTE — Assessment & Plan Note (Signed)
 hgba1c acceptable, minimize simple carbs. Increase exercise as tolerated.  

## 2022-08-14 NOTE — Assessment & Plan Note (Signed)
 Encouraged DASH or MIND diet, decrease po intake and increase exercise as tolerated. Needs 7-8 hours of sleep nightly. Avoid trans fats, eat small, frequent meals every 4-5 hours with lean proteins, complex carbs and healthy fats. Minimize simple carbs, high fat foods and processed foods 

## 2022-08-14 NOTE — Assessment & Plan Note (Signed)
Did not tolerate Atorvastatin. Tolerating Rosuvastatin. Encourage heart healthy diet such as MIND or DASH diet, increase exercise, avoid trans fats, simple carbohydrates and processed foods, consider a krill or fish or flaxseed oil cap daily.

## 2022-08-14 NOTE — Assessment & Plan Note (Signed)
 Supplement and monitor 

## 2022-08-14 NOTE — Progress Notes (Signed)
Subjective:    Patient ID: Nicholas Robertson, male    DOB: October 03, 1985, 37 y.o.   MRN: [id number]  Chief Complaint  Patient presents with  . Annual Exam    HPI Discussed the use of AI scribe software for clinical note transcription with the patient, who gave verbal consent to proceed.  History of Present Illness   The patient, with a history of hypertension, reports that her blood pressure has been mostly normal but occasionally on the higher side, with readings in the 130s over 80s to 90s. She has been monitoring her blood pressure at rest and reports her pulse to be in the 80s before medication and in the 60s to 70s after medication. The patient is currently on amlodipine and metoprolol for hypertension management as well as Olmesartanhct 40/25  In addition to hypertension, the patient has been experiencing constipation and bloating. He has been trying to increase her hydration and is considering adding a fiber supplement to her diet. She has previously seen a gastroenterologist for heartburn and is currently on Protonix and famotidine for this issue.  The patient also reports taking Ritalin, which she feels has significantly improved her mood and focus. She is also on Trintellix, a medication for depression.  The patient acknowledges that her lifestyle habits could be improved. He has a busy schedule due to work and caring for his one-year-old daughter, which has impacted his ability to exercise regularly and maintain a healthy diet. He expresses a desire to improve these habits for his own health and to set a good example for his daughter. .    Patient is a 37 yo male in today for annual preventative exam and for follow up on chronic medical concerns. No recent febrile illness or hospitalizations. Denies CP/palp/SOB/HA/congestion/fevers/GI or GU c/o. Taking meds as prescribed     Past Medical History:  Diagnosis Date  . Abnormal LFTs    . Anxiety and depression 04/18/2013  . Cardiac  murmur 12/01/2020  . Chest pain 12/01/2020  . Chest tightness 10/30/2020  . Essential hypertension 02/07/2013  . Family history of adverse reaction to anesthesia    father is hard to wake after anesthesia  . Gastroesophageal reflux disease 04/20/2020  . Hyperlipidemia 02/07/2013  . Ingrown toenail of both feet 07/05/2018  . Kidney stones   . Neck pain 08/02/2019  . Obesity (BMI 30-39.9) 08/02/2019  . Obesity (BMI 35.0-39.9 without comorbidity) 12/01/2020  . Preventative health care 04/18/2013  . Seborrhea 03/08/2019  . Vitamin B12 deficiency 09/12/2016  . Vitamin D deficiency 03/08/2019  . Wolff-Parkinson-White (WPW) syndrome 02/17/2013   S/p ablation.    Past Surgical History:  Procedure Laterality Date  . ABLATION  03/09/2013   EPS and RFCA of manifest right posteroloateral accessory pathway  . EXTRACORPOREAL SHOCK WAVE LITHOTRIPSY Left 11/08/2019   Procedure: EXTRACORPOREAL SHOCK WAVE LITHOTRIPSY (ESWL);  Surgeon: Sebastian Ache, MD;  Location: [facility];  Service: Urology;  Laterality: Left;  . FLEXIBLE SIGMOIDOSCOPY  2013   [facility]  . SIGMOIDOSCOPY    . SUPRAVENTRICULAR TACHYCARDIA ABLATION N/A 03/08/2013   Procedure: WPW ABLATION;  Surgeon: Marinus Maw, MD;  Location: [facility] CATH LAB;  Service: Cardiovascular;  Laterality: N/A;    Family History  Problem Relation Age of Onset  . Hypertension Mother   . Hyperlipidemia Mother   . Mental illness Mother        anxiety  . Other Mother  glucose intolerant  . Colon polyps Mother   . Diabetes Father        2  . Hypertension Father   . Arthritis Father   . Colon polyps Father   . Psoriasis Maternal Grandmother   . Hypertension Maternal Grandmother   . Cancer Maternal Grandmother        lymphoma  . Hyperlipidemia Maternal Grandfather   . Heart disease Maternal Grandfather        cabg  . Hypertension Maternal Grandfather   . Polymyalgia rheumatica Maternal  Grandfather   . Hypertension Paternal Grandmother   . Diabetes Paternal Grandmother        2  . COPD Paternal Grandmother   . Heart disease Paternal Grandfather        s/p MI, s/p CABG  . Hyperlipidemia Paternal Grandfather   . Hypertension Paternal Grandfather   . Diabetes Paternal Grandfather        1  . Colon cancer Neg Hx   . Esophageal cancer Neg Hx   . Rectal cancer Neg Hx   . Stomach cancer Neg Hx     Social History   Socioeconomic History  . Marital status: Married    Spouse name: Not on file  . Number of children: Not on file  . Years of education: Not on file  . Highest education level: Not on file  Occupational History  . Not on file  Tobacco Use  . Smoking status: Never  . Smokeless tobacco: Never  Vaping Use  . Vaping status: Never Used  Substance and Sexual Activity  . Alcohol use: No  . Drug use: No  . Sexual activity: Yes    Comment: no dietary restricitons, lives alone with cat, seat belts  Other Topics Concern  . Not on file  Social History Narrative   Works at [employer], as PA   Lives with partner   No major dietary restrictions.    Pet: cat, dog   Social Determinants of Health   Financial Resource Strain: Not on file  Food Insecurity: Not on file  Transportation Needs: Not on file  Physical Activity: Not on file  Stress: Not on file  Social Connections: Not on file  Intimate Partner Violence: Not on file    Outpatient Medications Prior to Visit  Medication Sig Dispense Refill  . rosuvastatin (CRESTOR) 5 MG tablet Take 1 tablet (5 mg total) by mouth daily. 90 tablet 3  . amLODipine (NORVASC) 5 MG tablet Take 1 tablet (5 mg total) by mouth daily. 90 tablet 3  . cyanocobalamin 1000 MCG tablet Take 1,000 mcg by mouth daily.    . famotidine (PEPCID) 40 MG tablet Take 1 tablet (40 mg total) by mouth daily. 90 tablet 1  . metoprolol succinate (TOPROL-XL) 50 MG 24 hr tablet Take 1 tablet (50 mg total) by mouth daily with or immediately  following a meal. 90 tablet 3  . pantoprazole (PROTONIX) 40 MG tablet Take 1 tablet (40 mg total) by mouth daily. 90 tablet 1  . methylphenidate (RITALIN) 10 MG tablet Take 1 tablet (10 mg total) by mouth 2 (two) times daily as needed. 60 tablet 0  . olmesartan-hydrochlorothiazide (BENICAR HCT) 40-25 MG tablet Take 1 tablet by mouth daily. 90 tablet 1  . Vitamin D, Ergocalciferol, (DRISDOL) 1.25 MG (50000 UNIT) CAPS capsule Take 1 capsule (50,000 Units total) by mouth every 7 (seven) days. 5 capsule 3  . vortioxetine HBr (TRINTELLIX) 20 MG TABS tablet Take 1 tablet (20  mg total) by mouth daily. 90 tablet 1   No facility-administered medications prior to visit.    No Known Allergies  Review of Systems  Constitutional:  Positive for malaise/fatigue. Negative for chills and fever.  HENT:  Negative for congestion and hearing loss.   Eyes:  Negative for discharge.  Respiratory:  Negative for cough, sputum production and shortness of breath.   Cardiovascular:  Negative for chest pain, palpitations and leg swelling.  Gastrointestinal:  Positive for constipation and heartburn. Negative for abdominal pain, blood in stool, diarrhea, nausea and vomiting.  Genitourinary:  Negative for dysuria, frequency, hematuria and urgency.  Musculoskeletal:  Negative for back pain, falls and myalgias.  Skin:  Negative for rash.  Neurological:  Negative for dizziness, sensory change, loss of consciousness, weakness and headaches.  Endo/Heme/Allergies:  Negative for environmental allergies. Does not bruise/bleed easily.  Psychiatric/Behavioral:  Negative for depression and suicidal ideas. The patient is not nervous/anxious and does not have insomnia.        Objective:    Physical Exam Vitals reviewed.  Constitutional:      General: He is not in acute distress.    Appearance: Normal appearance. He is not ill-appearing or diaphoretic.  HENT:     Head: Normocephalic and atraumatic.     Right Ear: Tympanic  membrane, ear canal and external ear normal. There is no impacted cerumen.     Left Ear: Tympanic membrane, ear canal and external ear normal. There is no impacted cerumen.     Nose: Nose normal. No rhinorrhea.     Mouth/Throat:     Pharynx: Oropharynx is clear.  Eyes:     General: No scleral icterus.    Extraocular Movements: Extraocular movements intact.     Conjunctiva/sclera: Conjunctivae normal.     Pupils: Pupils are equal, round, and reactive to light.  Neck:     Thyroid: No thyroid mass or thyroid tenderness.  Cardiovascular:     Rate and Rhythm: Normal rate and regular rhythm.     Pulses: Normal pulses.     Heart sounds: Normal heart sounds. No murmur heard. Pulmonary:     Effort: Pulmonary effort is normal.     Breath sounds: Normal breath sounds. No wheezing.  Abdominal:     General: Bowel sounds are normal.     Palpations: Abdomen is soft. There is no mass.     Tenderness: There is no guarding.  Musculoskeletal:        General: No swelling. Normal range of motion.     Cervical back: Normal range of motion and neck supple. No rigidity.     Right lower leg: No edema.     Left lower leg: No edema.  Lymphadenopathy:     Cervical: No cervical adenopathy.  Skin:    General: Skin is warm and dry.     Findings: No rash.  Neurological:     General: No focal deficit present.     Mental Status: He is alert and oriented to person, place, and time.     Cranial Nerves: No cranial nerve deficit.     Deep Tendon Reflexes: Reflexes normal.  Psychiatric:        Mood and Affect: Mood normal.        Behavior: Behavior normal.    BP (!) 145/95   Pulse 76   Resp 16   Ht 5' 9.5" (1.765 m)   Wt 253 lb (114.8 kg)   SpO2 97%   BMI 36.83 kg/m  Wt Readings from Last 3 Encounters:  08/15/22 253 lb (114.8 kg)  04/15/22 245 lb (111.1 kg)  02/25/22 246 lb (111.6 kg)    Diabetic Foot Exam - Simple   No data filed    Lab Results  Component Value Date   WBC 6.6 08/14/2022    HGB 15.7 08/14/2022   HCT 46.0 08/14/2022   PLT 236.0 08/14/2022   GLUCOSE 113 (H) 08/14/2022   CHOL 173 08/14/2022   TRIG 94.0 08/14/2022   HDL 48.50 08/14/2022   LDLDIRECT 168.0 08/02/2014   LDLCALC 106 (H) 08/14/2022   ALT 79 (H) 08/14/2022   AST 41 (H) 08/14/2022   NA 142 08/14/2022   K 3.7 08/14/2022   CL 103 08/14/2022   CREATININE 0.90 08/14/2022   BUN 11 08/14/2022   CO2 28 08/14/2022   TSH 2.89 08/14/2022   HGBA1C 6.3 08/14/2022    Lab Results  Component Value Date   TSH 2.89 08/14/2022   Lab Results  Component Value Date   WBC 6.6 08/14/2022   HGB 15.7 08/14/2022   HCT 46.0 08/14/2022   MCV 91.3 08/14/2022   PLT 236.0 08/14/2022   Lab Results  Component Value Date   NA 142 08/14/2022   K 3.7 08/14/2022   CO2 28 08/14/2022   GLUCOSE 113 (H) 08/14/2022   BUN 11 08/14/2022   CREATININE 0.90 08/14/2022   BILITOT 0.8 08/14/2022   ALKPHOS 60 08/14/2022   AST 41 (H) 08/14/2022   ALT 79 (H) 08/14/2022   PROT 6.8 08/14/2022   ALBUMIN 4.5 08/14/2022   CALCIUM 9.7 08/14/2022   ANIONGAP 10 03/10/2017   EGFR 115 12/01/2020   GFR 109.55 08/14/2022   Lab Results  Component Value Date   CHOL 173 08/14/2022   Lab Results  Component Value Date   HDL 48.50 08/14/2022   Lab Results  Component Value Date   LDLCALC 106 (H) 08/14/2022   Lab Results  Component Value Date   TRIG 94.0 08/14/2022   Lab Results  Component Value Date   CHOLHDL 4 08/14/2022   Lab Results  Component Value Date   HGBA1C 6.3 08/14/2022       Assessment & Plan:  Essential hypertension Assessment & Plan: Well controlled, no changes to meds. Encouraged heart healthy diet such as the DASH diet and exercise as tolerated  Orders: -     Comprehensive metabolic panel; Future -     CBC with Differential/Platelet; Future -     TSH; Future  Mixed hyperlipidemia Assessment & Plan: Did not tolerate Atorvastatin. Tolerating Rosuvastatin. Encourage heart healthy diet such as MIND  or DASH diet, increase exercise, avoid trans fats, simple carbohydrates and processed foods, consider a krill or fish or flaxseed oil cap daily.   Orders: -     Lipid panel; Future  Vitamin B12 deficiency Assessment & Plan: Supplement and monitor   Orders: -     Vitamin B12; Future  Vitamin D deficiency Assessment & Plan: Supplement and monitor is taking Vitamin D3 3000   mg daily reassess at next blood draw  Orders: -     VITAMIN D 25 Hydroxy (Vit-D Deficiency, Fractures); Future  Obesity (BMI 30-39.9) Assessment & Plan: Encouraged DASH or MIND diet, decrease po intake and increase exercise as tolerated. Needs 7-8 hours of sleep nightly. Avoid trans fats, eat small, frequent meals every 4-5 hours with lean proteins, complex carbs and healthy fats. Minimize simple carbs, high fat foods and processed foods    Hyperglycemia  Assessment & Plan: hgba1c acceptable, minimize simple carbs. Increase exercise as tolerated  Orders: -     Hemoglobin A1c; Future  Preventative health care Assessment & Plan: Patient encouraged to maintain heart healthy diet, regular exercise, adequate sleep. Consider daily probiotics. Take medications as prescribed.    Anxiety and depression -     Vortioxetine HBr; Take 1 tablet (20 mg total) by mouth daily.  Dispense: 90 tablet; Refill: 1  Other orders -     Valsartan-hydroCHLOROthiazide; Take 1 tablet by mouth daily.  Dispense: 90 tablet; Refill: 1 -     Methylphenidate HCl; Take 1 tablet (10 mg total) by mouth 2 (two) times daily as needed.  Dispense: 60 tablet; Refill: 0    Assessment and Plan    Hypertension: Borderline high blood pressure readings at home (130s/80s-90s). Discussed the impact of lifestyle factors including exercise, diet, and hydration. Medications affecting pulse rate limit options for aggressive treatment. -Switch from Benicar to Valsartan 320.25mg  daily. -Monitor blood pressure and pulse rate at home, report if consistently  in the 70s. -Consider increasing Amlodipine to 1.5 pills daily if pulse rate remains in the 70s.  Constipation and Bloating: Likely related to recent lifestyle changes and inadequate hydration. No current use of laxatives or stool softeners. -Increase hydration to 60-80 ounces daily. -Consider fiber supplement, probiotic, and heart-healthy diet. -Consider use of Senna, Docusate, or Miralax as needed.  Gastroesophageal Reflux Disease (GERD): Reports feeling reflux despite Protonix and Famotidine. Possible contribution from allergies and post-nasal drip. -Continue current medications. -Consider follow-up with Gastroenterology if symptoms persist.  Allergies: Reports use of Xyzal and Nasacort for symptom management. -Continue current medications. -Consider use of nasal saline flush to thin secretions.  Mental Health: Reports improved mood with Ritalin, currently taking once daily. Also on Trintellix 20mg  daily. -Refill Ritalin prescription. -Continue Trintellix 20mg  daily. -Follow-up with Behavioral Health at end of the month.  General Health Maintenance: -Continue B12 supplementation, aim for more consistent use. -Consider use of pill box to aid with medication adherence. -Schedule follow-up visit in three months with labs prior to the visit.         Danise Edge, MD

## 2022-08-14 NOTE — Telephone Encounter (Signed)
Pt came in for lab appt this morning.  No future orders were in EPIC.  Pt also has OV with PCP tomorrow.  Please place future orders if appropriate.  (I drew 2 gold top tubes and 2 lavender tubes for potential orders)

## 2022-08-14 NOTE — Telephone Encounter (Signed)
 Labs orders are placed

## 2022-08-14 NOTE — Assessment & Plan Note (Signed)
 Patient encouraged to maintain heart healthy diet, regular exercise, adequate sleep. Consider daily probiotics. Take medications as prescribed 

## 2022-08-14 NOTE — Assessment & Plan Note (Signed)
Supplement and monitor is taking Vitamin D3 3000   mg daily reassess at next blood draw

## 2022-08-15 ENCOUNTER — Encounter: Payer: Self-pay | Admitting: Family Medicine

## 2022-08-15 ENCOUNTER — Telehealth: Payer: Self-pay

## 2022-08-15 ENCOUNTER — Other Ambulatory Visit (HOSPITAL_COMMUNITY): Payer: Self-pay

## 2022-08-15 ENCOUNTER — Ambulatory Visit (INDEPENDENT_AMBULATORY_CARE_PROVIDER_SITE_OTHER): Payer: 59 | Admitting: Family Medicine

## 2022-08-15 ENCOUNTER — Telehealth: Payer: Self-pay | Admitting: Family Medicine

## 2022-08-15 ENCOUNTER — Other Ambulatory Visit: Payer: Self-pay

## 2022-08-15 VITALS — BP 145/95 | HR 76 | Resp 16 | Ht 69.5 in | Wt 253.0 lb

## 2022-08-15 DIAGNOSIS — E782 Mixed hyperlipidemia: Secondary | ICD-10-CM

## 2022-08-15 DIAGNOSIS — I10 Essential (primary) hypertension: Secondary | ICD-10-CM

## 2022-08-15 DIAGNOSIS — Z0000 Encounter for general adult medical examination without abnormal findings: Secondary | ICD-10-CM | POA: Diagnosis not present

## 2022-08-15 DIAGNOSIS — E559 Vitamin D deficiency, unspecified: Secondary | ICD-10-CM

## 2022-08-15 DIAGNOSIS — R0602 Shortness of breath: Secondary | ICD-10-CM | POA: Diagnosis not present

## 2022-08-15 DIAGNOSIS — E538 Deficiency of other specified B group vitamins: Secondary | ICD-10-CM

## 2022-08-15 DIAGNOSIS — F32A Depression, unspecified: Secondary | ICD-10-CM

## 2022-08-15 DIAGNOSIS — F17210 Nicotine dependence, cigarettes, uncomplicated: Secondary | ICD-10-CM | POA: Diagnosis not present

## 2022-08-15 DIAGNOSIS — E669 Obesity, unspecified: Secondary | ICD-10-CM

## 2022-08-15 DIAGNOSIS — Z87891 Personal history of nicotine dependence: Secondary | ICD-10-CM | POA: Diagnosis not present

## 2022-08-15 DIAGNOSIS — R739 Hyperglycemia, unspecified: Secondary | ICD-10-CM | POA: Diagnosis not present

## 2022-08-15 DIAGNOSIS — R002 Palpitations: Secondary | ICD-10-CM | POA: Diagnosis not present

## 2022-08-15 DIAGNOSIS — K219 Gastro-esophageal reflux disease without esophagitis: Secondary | ICD-10-CM | POA: Diagnosis not present

## 2022-08-15 DIAGNOSIS — F419 Anxiety disorder, unspecified: Secondary | ICD-10-CM

## 2022-08-15 DIAGNOSIS — I480 Paroxysmal atrial fibrillation: Secondary | ICD-10-CM | POA: Diagnosis not present

## 2022-08-15 DIAGNOSIS — R0789 Other chest pain: Secondary | ICD-10-CM | POA: Diagnosis not present

## 2022-08-15 MED ORDER — VORTIOXETINE HBR 20 MG PO TABS
20.0000 mg | ORAL_TABLET | Freq: Every day | ORAL | 1 refills | Status: DC
Start: 2022-08-15 — End: 2023-06-01
  Filled 2022-08-15 – 2022-09-02 (×2): qty 90, 90d supply, fill #0
  Filled 2022-12-09: qty 90, 90d supply, fill #1

## 2022-08-15 MED ORDER — VALSARTAN-HYDROCHLOROTHIAZIDE 320-25 MG PO TABS
1.0000 | ORAL_TABLET | Freq: Every day | ORAL | 1 refills | Status: DC
  Filled 2022-08-15: qty 90, 90d supply, fill #0

## 2022-08-15 MED ORDER — METHYLPHENIDATE HCL 10 MG PO TABS
10.0000 mg | ORAL_TABLET | Freq: Two times a day (BID) | ORAL | 0 refills | Status: DC | PRN
  Filled 2022-08-15: qty 60, 30d supply, fill #0

## 2022-08-15 NOTE — Telephone Encounter (Signed)
Message sent to pt.

## 2022-08-15 NOTE — Telephone Encounter (Signed)
Opened in error

## 2022-08-15 NOTE — Patient Instructions (Addendum)
Vitacost Oat fiber capsules   Preventive Care 72-37 Years Old, Male Preventive care refers to lifestyle choices and visits with your health care provider that can promote health and wellness. Preventive care visits are also called wellness exams. What can I expect for my preventive care visit? Counseling During your preventive care visit, your health care provider may ask about your: Medical history, including: Past medical problems. Family medical history. Current health, including: Emotional well-being. Home life and relationship well-being. Sexual activity. Lifestyle, including: Alcohol, nicotine or tobacco, and drug use. Access to firearms. Diet, exercise, and sleep habits. Safety issues such as seatbelt and bike helmet use. Sunscreen use. Work and work Astronomer. Physical exam Your health care provider may check your: Height and weight. These may be used to calculate your BMI (body mass index). BMI is a measurement that tells if you are at a healthy weight. Waist circumference. This measures the distance around your waistline. This measurement also tells if you are at a healthy weight and may help predict your risk of certain diseases, such as type 2 diabetes and high blood pressure. Heart rate and blood pressure. Body temperature. Skin for abnormal spots. What immunizations do I need?  Vaccines are usually given at various ages, according to a schedule. Your health care provider will recommend vaccines for you based on your age, medical history, and lifestyle or other factors, such as travel or where you work. What tests do I need? Screening Your health care provider may recommend screening tests for certain conditions. This may include: Lipid and cholesterol levels. Diabetes screening. This is done by checking your blood sugar (glucose) after you have not eaten for a while (fasting). Hepatitis B test. Hepatitis C test. HIV (human immunodeficiency virus) test. STI  (sexually transmitted infection) testing, if you are at risk. Talk with your health care provider about your test results, treatment options, and if necessary, the need for more tests. Follow these instructions at home: Eating and drinking  Eat a healthy diet that includes fresh fruits and vegetables, whole grains, lean protein, and low-fat dairy products. Drink enough fluid to keep your urine pale yellow. Take vitamin and mineral supplements as recommended by your health care provider. Do not drink alcohol if your health care provider tells you not to drink. If you drink alcohol: Limit how much you have to 0-2 drinks a day. Know how much alcohol is in your drink. In the [place] one drink equals one 12 oz bottle of beer (355 mL), one 5 oz glass of wine (148 mL), or one 1 oz glass of hard liquor (44 mL). Lifestyle Brush your teeth every morning and night with fluoride toothpaste. Floss one time each day. Exercise for at least 30 minutes 5 or more days each week. Do not use any products that contain nicotine or tobacco. These products include cigarettes, chewing tobacco, and vaping devices, such as e-cigarettes. If you need help quitting, ask your health care provider. Do not use drugs. If you are sexually active, practice safe sex. Use a condom or other form of protection to prevent STIs. Find healthy ways to manage stress, such as: Meditation, yoga, or listening to music. Journaling. Talking to a trusted person. Spending time with friends and family. Minimize exposure to UV radiation to reduce your risk of skin cancer. Safety Always wear your seat belt while driving or riding in a vehicle. Do not drive: If you have been drinking alcohol. Do not ride with someone who has been drinking. If you  have been using any mind-altering substances or drugs. While texting. When you are tired or distracted. Wear a helmet and other protective equipment during sports activities. If you have firearms  in your house, make sure you follow all gun safety procedures. Seek help if you have been physically or sexually abused. What's next? Go to your health care provider once a year for an annual wellness visit. Ask your health care provider how often you should have your eyes and teeth checked. Stay up to date on all vaccines. This information is not intended to replace advice given to you by your health care provider. Make sure you discuss any questions you have with your health care provider. Document Revised: 07/12/2020 Document Reviewed: 07/12/2020 Elsevier Patient Education  2024 [employer].

## 2022-08-16 ENCOUNTER — Encounter: Payer: Self-pay | Admitting: Family Medicine

## 2022-08-16 ENCOUNTER — Other Ambulatory Visit (HOSPITAL_COMMUNITY): Payer: Self-pay

## 2022-08-16 DIAGNOSIS — R9431 Abnormal electrocardiogram [ECG] [EKG]: Secondary | ICD-10-CM | POA: Diagnosis not present

## 2022-08-21 NOTE — Progress Notes (Signed)
Date: 08/23/2022 Appointment Start Time: 9:03am Duration: 100 minutes Provider: Helmut Muster, PsyD Type of Session: Initial Appointment for Evaluation  Location of Patient: Home Location of Provider: Provider's Home (private office) Type of Contact: [employer] Teams video visit with audio  Session Content:  Prior to proceeding with today's appointment, two pieces of identifying information were obtained from Nicholas Robertson to verify identity. In addition, Nicholas Robertson's physical location at the time of this appointment was obtained. In the event of technical difficulties, Nicholas Robertson shared a phone number he could be reached at. Nicholas Robertson and this provider participated in today's telepsychological service. Nicholas Robertson denied anyone else being present in the room or on the virtual appointment.  The provider's role was explained to Nicholas Robertson. The provider reviewed and discussed issues of confidentiality, privacy, and limits therein (e.g., reporting obligations). In addition to verbal informed consent, written informed consent for psychological services was obtained from Nicholas Robertson prior to the initial appointment. Written consent included information concerning the practice, financial arrangements, and confidentiality and patients' rights. Since the clinic is not a 24/7 crisis center, mental health emergency resources were shared, and the provider explained e-mail, voicemail, and/or other messaging systems should be utilized only for non-emergency reasons. This provider also explained that information obtained during appointments will be placed in their electronic medical record in a confidential manner. Nicholas Robertson verbally acknowledged understanding of the aforementioned and agreed to use mental health emergency resources discussed if needed. Moreover, Nicholas Robertson agreed information may be shared with other [facility] or their referring provider(s) as needed for coordination of care. By signing the new patient documents, Nicholas Robertson provided  written consent for coordination of care. Nicholas Robertson verbally acknowledged understanding he is ultimately responsible for understanding his insurance benefits as it relates to reimbursement of telepsychological and in-person services. This provider also reviewed confidentiality, as it relates to telepsychological services, as well as the rationale for telepsychological services. This provider further explained that video should not be captured, photos should not be taken, nor should testing stimuli be copied or recorded as it would be a copyright violation. Nicholas Robertson expressed understanding of the aforementioned, and verbally consented to proceed. Nicholas Robertson is aware of the limitations of teleheath visits and verbally consented to proceed.  Nicholas Robertson completed the neurobehavioral examination, which included obtaining a, family, social, and psychiatric history; integration of prior history and other sources of clinical data to assist with clinical decision making; behavioral observations; assessment of thinking, reasoning, and judgment; and establishment of a provisional diagnosis. The evaluation was completed in 100 minutes. Codes [id number] and [id number] were billed.   Mental Status Examination:  Appearance:  neat Behavior: appropriate to circumstances Mood: anxious Affect: mood congruent Speech: pressured Eye Contact: appropriate Psychomotor Activity: restless Thought Process: linear, logical, and goal directed Content/Perceptual Disturbances: none Orientation: AAOx4 Cognition/Sensorium: intact Insight: good Judgment: good  Provisional DSM-5 diagnosis(es):  F89 Unspecified Disorder of Psychological Development   Plan: Testing is expected to answer the question, does the individual meet criteria for ADHD when age, other mental health concerns (e.g., depression and anxiety) and cognitive functioning are taken into consideration? Further testing is warranted because a diagnosis cannot be given solely based on  current interview data (further data is required). Testing results are expected to answer the remaining diagnostic questions in order to provide an accurate diagnosis and assist in treatment planning with an expectation of improved clinical outcome. Nicholas Robertson is currently scheduled for an appointment on 09/06/2022 at 3pm via [employer] video visit with audio.  Margarite Gouge, PsyD

## 2022-08-23 ENCOUNTER — Ambulatory Visit (INDEPENDENT_AMBULATORY_CARE_PROVIDER_SITE_OTHER): Payer: 59 | Admitting: Psychology

## 2022-08-23 DIAGNOSIS — F89 Unspecified disorder of psychological development: Secondary | ICD-10-CM | POA: Diagnosis not present

## 2022-09-02 ENCOUNTER — Other Ambulatory Visit: Payer: Self-pay

## 2022-09-02 ENCOUNTER — Other Ambulatory Visit: Payer: Self-pay | Admitting: Family Medicine

## 2022-09-02 ENCOUNTER — Other Ambulatory Visit (HOSPITAL_COMMUNITY): Payer: Self-pay

## 2022-09-02 MED ORDER — FAMOTIDINE 40 MG PO TABS
40.0000 mg | ORAL_TABLET | Freq: Every day | ORAL | 1 refills | Status: DC
  Filled 2022-09-02: qty 90, 90d supply, fill #0
  Filled 2022-12-09: qty 90, 90d supply, fill #1

## 2022-09-02 MED ORDER — PANTOPRAZOLE SODIUM 40 MG PO TBEC
40.0000 mg | DELAYED_RELEASE_TABLET | Freq: Every day | ORAL | 1 refills | Status: DC
  Filled 2022-09-02 – 2022-10-05 (×2): qty 90, 90d supply, fill #0

## 2022-09-05 NOTE — Progress Notes (Signed)
Date: 09/05/2022   Appointment Start Time: 3pm Duration: 70 minutes Provider: Helmut Muster, PsyD Type of Session: Testing Appointment for Evaluation  Location of Patient: Home Location of Provider: Provider's Home (private office) Type of Contact: [employer] Teams video visit with audio  Session Content: Today's appointment was a telepsychological visit. Jamieson is aware it is his responsibility to secure confidentiality on his end of the session. Prior to proceeding with today's appointment, Kyren's physical location at the time of this appointment was obtained as well a phone number he could be reached at in the event of technical difficulties. Maximilien denied anyone else being present in the room or on the virtual appointment. This provider reviewed that video should not be captured, photos should not be taken, nor should testing stimuli be copied or recorded as it would be a copyright violation. Erving expressed understanding of the aforementioned, and verbally consented to proceed. The WAIS-IV was administered, scored, and interpreted by this evaluator. Escar is aware of the limitations of teleheath visits and verbally consented to proceed.  Billing codes will be input on the feedback appointment. There are no billing codes for the testing appointment.   Provisional DSM-5 diagnosis(es):  F89 Unspecified Disorder of Psychological Development   Plan: Orlandus was scheduled for a feedback appointment on 09/11/2022 at 5pm via [employer] video visit with audio.                Margarite Gouge, PsyD

## 2022-09-06 ENCOUNTER — Ambulatory Visit: Payer: 59 | Admitting: Psychology

## 2022-09-06 DIAGNOSIS — F89 Unspecified disorder of psychological development: Secondary | ICD-10-CM

## 2022-09-11 ENCOUNTER — Ambulatory Visit (INDEPENDENT_AMBULATORY_CARE_PROVIDER_SITE_OTHER): Payer: 59 | Admitting: Psychology

## 2022-09-11 DIAGNOSIS — F908 Attention-deficit hyperactivity disorder, other type: Secondary | ICD-10-CM

## 2022-09-11 DIAGNOSIS — F88 Other disorders of psychological development: Secondary | ICD-10-CM

## 2022-09-11 NOTE — Progress Notes (Addendum)
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 Not Problematic Plan/Organize 11 42 34 Not Problematic Task Monitor 7 43 46 Not Problematic Organization of Materials 9 40 28 Not Problematic Metacognition Index (MI) 47 42 32 Not Problematic Global Executive Composite (GEC) 84 42 29 Not Problematic  Validity Scale Raw Score Cumulative Percentile Protocol Classification Negativity 0 0 - 98.5 Acceptable Infrequency 1 0 - 93.3 Acceptable Inconsistency 1 0 - 98.8 Acceptable  BEHAVIORAL FUNCTIONING   Patient Health Questionnaire-9 (PHQ-9): Mr. Art completed the PHQ-9, a self-report measure that assesses symptoms of depression. He scored 2/27, which indicates minimal depression.   Generalized Anxiety Disorder-7 (GAD-7): Mr. Zuke completed the GAD-7, a self-report measure that assesses symptoms of anxiety. He scored 4/21, which indicates minimal anxiety.   Adult ADHD Self-Report Scale Symptom Checklist (ASRS): Mr. Kurlander reported the following symptoms as sometimes: difficulty wrapping up final details of a project following the completion of challenging aspects, difficulty getting things in order when a task requires organization, fidgeting or squirming, making careless mistakes when working on boring or difficult projects, struggling to concentrate on what people say even when they are speaking directly to him, misplacing or has difficulty finding things, feeling restless or fidgety, talking too much in social situations, and  interrupting others when they are busy. He endorsed the following symptoms as occurring often: avoiding or delaying getting started on tasks requiring a lot of thought and difficulty relaxing. He endorsed the following symptoms as very often: struggling to sustain attention when doing boring or repetitive work and being distracted by noise around him. Endorsement of at least four items in Part A is highly consistent with ADHD in adults. The frequency scores of Part B provides additional cues. Mr. Rausch scored a 3/6 on Part A and 5/12 on Part B, which is considered a negative screening for ADHD. Upon follow-up, Mr. Weaverling expressed a belief that his scores on the measure may have been modestly higher and have been a positive screening if his depression- and anxiety-related symptoms were not being managed (e.g., he would more frequently experience restlessness and difficulty sustaining his attention during conversations).   Personality Assessment Inventory (PAI): The PAI is an objective inventory of adult personality. The validity indicators suggested Mr. Manecke profile is interpretable (ICN T = 52, INF T = 59, NIM T = 47, and PIM T = 38). He is endorsing frequent occurrence of common physical symptoms and ill health (e.g., headaches, back problems, and/or gastrointestinal ailments; SOM-S T = 65); significant anxiety and tension (ANX T = 71 and BOR-A T = 72)  that includes ruminative worry and concern that impairs concentration (ANX-C T = 71), difficulty relaxing and fatigue (ANX-A T = 65), overt physical signs of tension (e.g., sweaty palms, complaints of irregular heartbeats, and/or shortness of breath; ANX-P T = 72), and possible specific fears (ARD-P T = 62); unhappiness at least part of the time (DEP T = 69 and BOR-A T = 72) that includes thoughts of inadequacy (DEP-C T = 69), anhedonia (DEP-A T = 77), and periodic thoughts of self-harm; impatience and being easily frustrated (MAN-I T = 77); having limited  interest in the lives of other people (SCZ-S T = 66 and WRM T = 37); and uncertainty about major life issues and difficulties in developing and maintaining a sense of purpose (BOR-I T = 71). He reports having close, generally supportive relationships with friends and family (NON T = 81) and appears to acknowledge the need to make some changes, has a positive attitude toward the possibility of personal change, and accepts the importance of personal responsibility (RXR T = 46).     SUMMARY AND CLINICAL IMPRESSIONS: Mr. Yordi Pankratz is a 37 year old male who was referred by Dr. Danise Edge for an evaluation to determine if he currently meets criteria for a diagnosis of Attention-Deficit/Hyperactivity Disorder (ADHD).   Mr. Seidner reported a history of "depression and anxiety" that is "pretty well controlled"  with psychotropic medication. He further reported he recently began utilizing "methylphenidate" to assist with ADHD-related concerns that became exacerbated by an increase in responsibilities and stress stemming from an adoption process, noting medication has been helpful but it is "still hard to shut [his] mind off." He expressed a belief his inattention and feelings of being overwhelmed are consistent and independent of mood, although indicated other endorsed ADHD-related concerns are related to stress.  During the evaluation, Mr. Barfuss was administered assessments to measure his current cognitive abilities. His verbal comprehension abilities were in the very superior range, and he demonstrated the strongest performance on the Vocabulary subtest, which required him to explain the meaning of words presented in isolation. Additionally, performance on this subtest requires abilities to verbalize meaningful concepts, as well as retrieve information from long-term memory. His lowest performance was on the Similarities and Information subtests. The Similarities subtest measured his ability to abstract  meaningful concepts and relationships from verbally presented material. The Information subtest is primarily a measure of his fund of general knowledge but may also be influenced by cultural experience, quality of education, and ability to retrieve information from long-term memory. His ability to sustain attention, concentrate, and exert mental control was in the superior range and his performance on the subtests was comparable. Results of the CNS Vital Signs indicated an average neurocognitive processing ability with strengths in visual memory, processing speed, working memory, and sustained attention.  During the clinical interview and on self-report measures, Mr. Raimer endorsed executive functioning concerns that include attentional dysregulation and hyperactivity- and impulsivity-related symptoms, although his endorsed concerns were slightly below the threshold of meeting full criteria for a diagnosis of ADHD as he indicated some of his endorsed ADHD-related concerns occur only occasionally (versus often) and/or tend to primarily occur secondary to distress. Moreover, Mr. Valarie Cones indicated he does not perceive Mr. Batton as experiencing significant executive functioning issues, although Mr. Nistler stated a belief this is likely due to his use of various coping and compensatory strategies that he noted can be "exhausting." While discrepant perceptions of Mr. Huffines executive functioning make interpretation difficult, when considering his self-reported ADHD-related symptoms that he described as consistent and independent of mood as well as not having been fully resolved despite managing his depression and anxiety; endorsed and/or demonstrated executive functioning and working memory difficulties; reported use of various compensatory and coping strategies that he described as helpful in reducing executive functioning problems; and endorsed ADHD symptoms are below the threshold for meeting full criteria for  ADHD, a diagnosis of F88 Other Specified Neurodevelopmental Disorder, Subthreshold ADHD appears warranted.   Mr. Farleigh also endorsed a history of recurrent depressive episodes, but his mood has been "steady" with use of psychotropic medication; generalized anxiety- and social anxiety-related symptoms; commonly tossing and turning in his sleep as well as infrequent sleep onset and maintenance issues that he described as "circumstantial" and often related to "stress;" concerns about "sleep apnea" as he has "sporadic snoring" and does not always feel rested upon waking; emotional eating-related behaviors that can lead to him eating until experiencing significant physical discomfort, although he noted use of Trintellix has helped with these behaviors; being "put in the middle of a lot of situations" during his parents' divorce and separation; and periods of fleeting passive suicidal ideation (e.g., "What's the point [of continuing to live]?") with the last instance occurring during his "teenage years." As such, the PHQ-9, GAD-7, and PAI were administered. He endorsed minimal depression and anxiety severity symptom, which is  consistent with his belief that his depression- and anxiety-related symptoms are being adequately managed through use of psychotropic medication and compensatory strategies. While he endorsed a history of depression- and anxiety-related symptoms, given the limited scope of this evaluation, it was unable to be determined if full criteria were met or are currently met or if the symptoms are better explained by his diagnosis of Other Specified Neurodevelopmental Disorder, Subthreshold ADHD. His ADHD-related symptoms do not appear to be better accounted for by possible learning disorder, sleep apnea, depression, or anxiety as he described the symptoms as occurring prior to some of the aforementioned concerns, independently of mood, and outside of learning and academic settings.  DSM-5 Diagnostic  Impressions: F88 Other Specified Neurodevelopmental Disorder, Subthreshold ADHD  RECOMMENDATIONS: 1. Mr. Amparano would likely benefit from making use of strategies for ADHD symptoms:  a. Setting a timer to complete tasks. b. Breaking tasks into manageable chunks and spreading them out over longer periods of time with breaks.  c. Utilizing lists and day calendars to keep track of tasks.  d. Answering emails daily.  e. Improving listening skills by asking the speaker to give information in smaller chunks and asking for explanation for clarification as needed. f. Leaving more than the anticipated time to complete tasks. 2. It may help to keep tasks brief, well within your attention span, and a mix of both high and low interest tasks. Tasks may be gradually increased in length. 3. Practice proactive planning by setting aside time every evening to plan for the next day (e.g., prepare needed materials or pack the car the night before).  4. Learn how to make an effective and reasonable "to do" list of important tasks and priorities and always keep it easily accessible. Make additional copies in case it is lost or misplaced. 5. Utilize visual reminders by posting appointments, "to do lists," or schedule in strategic areas at home and at work.  6. Practice using an appointment book, smart phone or other tech device, or a daily planning calendar, and learn to write down appointments and commitments immediately. 7. Keep notepads or use a portable audio recorder to capture important ideas that would be beneficial to recall later. 8. Learn and practice time management skills. Purchase a programmable alarm watch or set an alarm on smartphone to avoid losing track of time.  9. Use a color-coded file system, desk and closet organizers, storage boxes, or other organization devices to reduce clutter and improve efficiency and structure.  10. Implement ways to become more aware of your actions and to inhibit or  adjust them as warranted (e.g., reviewing videos of your actions, consider consequences of obeying or not obeying the rules of various upcoming situations, have a trusted other to discuss plans with and/or provide cues to stop certain behaviors, and make visual cues for rules you would like to follow). 11. Stay flexible and be prepared to change your plans as symptom breakthroughs and crises are likely to occur periodically. 12. Mr. Colla may benefit from mindfulness training to address symptoms of inattention.  13. Mr. Krizman may benefit from ongoing consultation regarding medication for ADHD symptoms.   14. Individual therapeutic services may assist in processing a diagnosis of subthreshold ADHD and discussing additional coping and compensatory strategies. 15. Mental alertness/energy can be raised by increasing exercise; improving sleep; eating a healthy diet; and managing stress. Consulting with a physician regarding any changes to physical regimen is recommended. 16. "Failing at Normal: An ADHD Success Story" by Calvert Cantor  is a great overview of ADHD. Dr. Janese Banks also has a YouTube channel with helpful videos on ADHD-related topics: http://www.mitchell-reyes.biz/ 17. Applications:   RescueTime. Tracks your activities on phone and/or computer to determine how productive you have been, and what distracted you. Free two week trial.   [email or web address] . Uses engineered audio that human voice-like frequencies. Free 15-day trial.  Freedom. Allows you to highlight days and times you want to block yourself from certain sites or apps. Free trial.  Mint.  Allows you to input your bank accounts and creates a visual layout of various information about your financial goals, budget management, alerts, etc. Free.  Boomerang. Gives you the option to schedule times an email is sent as well as to see if others have received or opened your email. 10 messages free per month and a free  trial of premium version.  IFTTT. Uses "channels" to create various actions (e.g., if you are mentioned in an email to highlight it in your inbox and if you miss a call to add it to a to-do list). Free and premium versions.  Unroll.me. Cleans up your email by unsubscribing from what you do not want to receive while still getting everything you do. Free.  Finish. Allows you to divide two-list tasks into short-term, mid-term, and long-term as well as how much time is left for a task. Focus mode hides non-priority tasks.   Autosilent. Turns your phone ringer on and off based on specified calendars, geo-fences, timers, etc. $3.99.  Freakyalarm. Makes you solve math problems to disable an alarm. $1.99.  Wake N Shake. Makes you vigorously shake your phone to stop the alarm. $.99.  Todoist. Allows you to add sub-tasks to tasks as well as includes email and Web plugins to make it work across system. Premium has location-based reminders, calendar sync, productive tracking, etc.   Sleep Cycle. Utilizes your phone's motion sensors to pick up on movement while you are asleep. The alarm will wake you as early as 30 minutes before your alarm based on your lightest phase of sleep as well as showing you how daily activities affect your sleep quality.  18. Books:  "Taking Charge of Adult ADHD Second Edition" by Dr. Janese Banks  "The ADHD Effect on Marriage" by Annamarie Dawley  "The Couples Guide to Thriving with ADHD" by Annamarie Dawley 19. Organizations that are a reliable source of information on ADHD:   Children and Adults with Attention-Deficit/Hyperactivity Disorder (CHADD): chadd.org   Attention Deficit Disorder Association (ADDA): HotterNames.de  ADD Resources: addresources.org  ADD WareHouse: addwarehouse.com  World Federation of ADHD: adhd-federation.org  ADDConsults: FightListings.se.  Compilation of ADHD resources: https://www.harrell.com/ 20. Future evaluation if deemed  necessary and/or to determine effectiveness of recommended interventions.   Helmut Muster, Psy.D. Licensed Psychologist - [facility] #[id number]              Margarite Gouge, PsyD

## 2022-09-29 ENCOUNTER — Emergency Department (HOSPITAL_BASED_OUTPATIENT_CLINIC_OR_DEPARTMENT_OTHER): Payer: 59

## 2022-09-29 ENCOUNTER — Emergency Department (HOSPITAL_BASED_OUTPATIENT_CLINIC_OR_DEPARTMENT_OTHER)
Admission: EM | Admit: 2022-09-29 | Discharge: 2022-09-29 | Disposition: A | Discharge status: Home / Self Care | Payer: 59 | Attending: Emergency Medicine | Admitting: Emergency Medicine

## 2022-09-29 ENCOUNTER — Encounter (HOSPITAL_BASED_OUTPATIENT_CLINIC_OR_DEPARTMENT_OTHER): Payer: Self-pay

## 2022-09-29 DIAGNOSIS — Z79899 Other long term (current) drug therapy: Secondary | ICD-10-CM | POA: Insufficient documentation

## 2022-09-29 DIAGNOSIS — R110 Nausea: Secondary | ICD-10-CM | POA: Insufficient documentation

## 2022-09-29 DIAGNOSIS — R319 Hematuria, unspecified: Secondary | ICD-10-CM | POA: Insufficient documentation

## 2022-09-29 DIAGNOSIS — I10 Essential (primary) hypertension: Secondary | ICD-10-CM | POA: Diagnosis not present

## 2022-09-29 DIAGNOSIS — K760 Fatty (change of) liver, not elsewhere classified: Secondary | ICD-10-CM | POA: Diagnosis not present

## 2022-09-29 DIAGNOSIS — R109 Unspecified abdominal pain: Secondary | ICD-10-CM | POA: Insufficient documentation

## 2022-09-29 DIAGNOSIS — D1803 Hemangioma of intra-abdominal structures: Secondary | ICD-10-CM | POA: Diagnosis not present

## 2022-09-29 LAB — URINALYSIS, MICROSCOPIC (REFLEX): RBC / HPF: >50 RBC/hpf (ref 0–5)

## 2022-09-29 LAB — URINALYSIS, ROUTINE W REFLEX MICROSCOPIC
Bilirubin Urine: NEGATIVE
Glucose, UA: NEGATIVE mg/dL
Ketones, ur: NEGATIVE mg/dL
Leukocytes,Ua: NEGATIVE
Nitrite: NEGATIVE
Protein, ur: 30 mg/dL — AB
Specific Gravity, Urine: 1.025 (ref 1.005–1.030)
pH: 6.5 (ref 5.0–8.0)

## 2022-09-29 MED ORDER — ONDANSETRON HCL 4 MG PO TABS: 4.0000 mg | ORAL_TABLET | Freq: Three times a day (TID) | ORAL | 0 refills | Status: DC | PRN

## 2022-09-29 MED ORDER — ONDANSETRON 4 MG PO TBDP
8.0000 mg | ORAL_TABLET | Freq: Once | ORAL | Status: AC
  Administered 2022-09-29: 8 mg via ORAL
  Filled 2022-09-29: qty 2

## 2022-09-29 MED ORDER — OXYCODONE-ACETAMINOPHEN 5-325 MG PO TABS
1.0000 | ORAL_TABLET | Freq: Once | ORAL | Status: AC
  Administered 2022-09-29: 1 via ORAL
  Filled 2022-09-29: qty 1

## 2022-09-29 NOTE — Discharge Instructions (Addendum)
We evaluated you for your flank pain.  Your CT scan did not show any kidney stone blocking your urinary tract, but your urine did have a lot of red blood cells.  Since your pain is also significantly improved, this means you most likely have a kidney stone that has already passed.  You do have a kidney stone in your left kidney that is not blocking your urinary tract.  This would not be the cause of your pain, but it might cause pain in the future.  I have prescribed you some Zofran which you can take once every 4 hours as needed for nausea.  I would also recommend naproxen and Tylenol for any ongoing pain.  Please follow-up with the urologist and your primary doctor.  If you have recurrent or worsening symptoms, please return to the emergency department.

## 2022-09-29 NOTE — ED Triage Notes (Signed)
Pt reports to ED with concerns that he has another kidney stone. Hx of kidney stone in 2019 with lithotripsy treatment. States that the pain woke him up this morning. States some nausea.

## 2022-09-29 NOTE — ED Provider Notes (Signed)
EMERGENCY DEPARTMENT AT [facility] Provider Note  CSN: [id number] Arrival date & time: 09/29/22 7253  Chief Complaint(s) Flank Pain  HPI Nicholas Robertson is a 37 y.o. male history of hyperlipidemia, hypertension, kidney stones presenting to the emergency department with flank pain.  Patient reports left-sided flank pain beginning this morning, radiates around to the abdomen.  Denies any urinary symptoms, fevers or chills.  Reports some nausea.  No vomiting.  Took naproxen which has helped with his symptoms.  No diarrhea.   Past Medical History Past Medical History:  Diagnosis Date   Abnormal LFTs     Anxiety and depression 04/18/2013   Cardiac murmur 12/01/2020   Chest pain 12/01/2020   Chest tightness 10/30/2020   Essential hypertension 02/07/2013   Family history of adverse reaction to anesthesia    father is hard to wake after anesthesia   Gastroesophageal reflux disease 04/20/2020   Hyperlipidemia 02/07/2013   Ingrown toenail of both feet 07/05/2018   Kidney stones    Neck pain 08/02/2019   Obesity (BMI 30-39.9) 08/02/2019   Obesity (BMI 35.0-39.9 without comorbidity) 12/01/2020   Preventative health care 04/18/2013   Seborrhea 03/08/2019   Vitamin B12 deficiency 09/12/2016   Vitamin D deficiency 03/08/2019   Wolff-Parkinson-White (WPW) syndrome 02/17/2013   S/p ablation.   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   Hyperglycemia 04/11/2021   Chest pain 12/01/2020   Cardiac murmur 12/01/2020   Kidney stones 11/15/2020   Gastroesophageal reflux disease 04/20/2020   Neck pain 08/02/2019   Obesity (BMI 30-39.9) 08/02/2019   Vitamin D deficiency 03/08/2019   Seborrhea 03/08/2019   Ingrown nail of great toe 07/05/2018   Vitamin B12 deficiency 09/12/2016   Family history of adverse reaction to anesthesia 09/12/2016   Anxiety and depression 04/18/2013   Preventative health care 04/18/2013    Wolff-Parkinson-White (WPW) syndrome 02/17/2013   Essential hypertension 02/07/2013   Hyperlipidemia 02/07/2013   Abnormal LFTs 02/07/2013   Home Medication(s) Prior to Admission medications   Medication Sig Start Date End Date Taking? Authorizing Provider  ondansetron (ZOFRAN) 4 MG tablet Take 1 tablet (4 mg total) by mouth every 8 (eight) hours as needed for nausea or vomiting. 09/29/22  Yes Lonell Grandchild, MD  rosuvastatin (CRESTOR) 5 MG tablet Take 1 tablet (5 mg total) by mouth daily. 03/04/22   Bradd Canary, MD  amLODipine (NORVASC) 5 MG tablet Take 1 tablet (5 mg total) by mouth daily. 03/11/22   Bradd Canary, MD  cyanocobalamin 1000 MCG tablet Take 1,000 mcg by mouth daily.    [provider]  famotidine (PEPCID) 40 MG tablet Take 1 tablet (40 mg total) by mouth daily. 09/02/22   Sheliah Hatch, MD  methylphenidate (RITALIN) 10 MG tablet Take 1 tablet (10 mg total) by mouth 2 (two) times daily as needed. 08/15/22   Bradd Canary, MD  metoprolol succinate (TOPROL-XL) 50 MG 24 hr tablet Take 1 tablet (50 mg total) by mouth daily with or immediately following a meal. 10/12/21   Sheliah Hatch, MD  pantoprazole (PROTONIX) 40 MG tablet Take 1 tablet (40 mg total) by mouth daily. 09/02/22   Bradd Canary, MD  valsartan-hydrochlorothiazide (DIOVAN-HCT) 320-25 MG tablet Take 1 tablet by mouth daily. 08/15/22   Bradd Canary, MD  vortioxetine HBr (TRINTELLIX) 20 MG TABS tablet Take 1 tablet (20 mg total) by mouth daily. 08/15/22   Danise Edge  A, MD                                                                                                                                    Past Surgical History Past Surgical History:  Procedure Laterality Date   ABLATION  03/09/2013   EPS and RFCA of manifest right posteroloateral accessory pathway   EXTRACORPOREAL SHOCK WAVE LITHOTRIPSY Left 11/08/2019   Procedure: EXTRACORPOREAL SHOCK WAVE LITHOTRIPSY (ESWL);  Surgeon: Sebastian Ache, MD;  Location: [facility];  Service: Urology;  Laterality: Left;   FLEXIBLE SIGMOIDOSCOPY  2013   [facility] [place]   SIGMOIDOSCOPY     SUPRAVENTRICULAR TACHYCARDIA ABLATION N/A 03/08/2013   Procedure: WPW ABLATION;  Surgeon: Marinus Maw, MD;  Location: [facility] CATH LAB;  Service: Cardiovascular;  Laterality: N/A;   Family History Family History  Problem Relation Age of Onset   Hypertension Mother    Hyperlipidemia Mother    Mental illness Mother        anxiety   Other Mother        glucose intolerant   Colon polyps Mother    Diabetes Father        2   Hypertension Father    Arthritis Father    Colon polyps Father    Psoriasis Maternal Grandmother    Hypertension Maternal Grandmother    Cancer Maternal Grandmother        lymphoma   Hyperlipidemia Maternal Grandfather    Heart disease Maternal Grandfather        cabg   Hypertension Maternal Grandfather    Polymyalgia rheumatica Maternal Grandfather    Hypertension Paternal Grandmother    Diabetes Paternal Grandmother        2   COPD Paternal Grandmother    Heart disease Paternal Grandfather        s/p MI, s/p CABG   Hyperlipidemia Paternal Grandfather    Hypertension Paternal Grandfather    Diabetes Paternal Grandfather        1   Colon cancer Neg Hx    Esophageal cancer Neg Hx    Rectal cancer Neg Hx    Stomach cancer Neg Hx     Social History Social History   Tobacco Use   Smoking status: Never   Smokeless tobacco: Never  Vaping Use   Vaping status: Never Used  Substance Use Topics   Alcohol use: No   Drug use: No   Allergies Patient has no known allergies.  Review of Systems Review of Systems  All other systems reviewed and are negative.   Physical Exam Vital Signs  I have reviewed the triage vital signs BP (!) 159/82 (BP Location: Right Arm)   Pulse 64   Temp 97.7 F (36.5 C) (Oral)   Resp 18   SpO2 98%  Physical Exam Vitals and nursing  note reviewed.  Constitutional:  General: He is not in acute distress.    Appearance: Normal appearance.  HENT:     Mouth/Throat:     Mouth: Mucous membranes are moist.  Eyes:     Conjunctiva/sclera: Conjunctivae normal.  Cardiovascular:     Rate and Rhythm: Normal rate and regular rhythm.  Pulmonary:     Effort: Pulmonary effort is normal. No respiratory distress.     Breath sounds: Normal breath sounds.  Abdominal:     General: Abdomen is flat.     Palpations: Abdomen is soft.     Tenderness: There is no abdominal tenderness. There is no right CVA tenderness or left CVA tenderness.  Musculoskeletal:     Right lower leg: No edema.     Left lower leg: No edema.  Skin:    General: Skin is warm and dry.     Capillary Refill: Capillary refill takes less than 2 seconds.  Neurological:     Mental Status: He is alert and oriented to person, place, and time. Mental status is at baseline.  Psychiatric:        Mood and Affect: Mood normal.        Behavior: Behavior normal.     ED Results and Treatments Labs (all labs ordered are listed, but only abnormal results are displayed) Labs Reviewed  URINALYSIS, ROUTINE W REFLEX MICROSCOPIC - Abnormal; Notable for the following components:      Result Value   APPearance CLOUDY (*)    Hgb urine dipstick LARGE (*)    Protein, ur 30 (*)    All other components within normal limits  URINALYSIS, MICROSCOPIC (REFLEX) - Abnormal; Notable for the following components:   Bacteria, UA RARE (*)    All other components within normal limits                                                                                                                          Radiology CT Renal Stone Study  Result Date: 09/29/2022 CLINICAL DATA:  Left flank pain EXAM: CT ABDOMEN AND PELVIS WITHOUT CONTRAST TECHNIQUE: Multidetector CT imaging of the abdomen and pelvis was performed following the standard protocol without IV contrast. RADIATION DOSE REDUCTION:  This exam was performed according to the departmental dose-optimization program which includes automated exposure control, adjustment of the mA and/or kV according to patient size and/or use of iterative reconstruction technique. COMPARISON:  Prior CT scan 03/10/2017; MRI abdomen 03/20/2019 FINDINGS: Lower chest: No acute abnormality. Hepatobiliary: Diffuse low attenuation of the hepatic parenchyma consistent with steatosis. Region of relatively increased density in hepatic segment 5 adjacent to the gallbladder fossa corresponds with benign hemangioma seen on prior MR imaging. Gallbladder is unremarkable. No intra or extrahepatic biliary ductal dilatation. Pancreas: Unremarkable. No pancreatic ductal dilatation or surrounding inflammatory changes. Spleen: Normal in size without focal abnormality. Adrenals/Urinary Tract: Normal adrenal glands. No evidence of hydronephrosis. Nonobstructing 6-7 mm stone present in the lower pole collecting system of the left kidney. The ureters and  bladder are unremarkable. No evidence of ureteral stone. Stomach/Bowel: No evidence of obstruction or focal bowel wall thickening. Normal appendix in the right lower quadrant. The terminal ileum is unremarkable. Vascular/Lymphatic: Limited evaluation in the absence of intravenous contrast. No evidence of aneurysm or atherosclerotic plaque. No suspicious lymphadenopathy. Reproductive: Prostate gland is unremarkable. Other: No abdominal wall hernia or abnormality. No abdominopelvic ascites. Musculoskeletal: No acute fracture or aggressive appearing lytic or blastic osseous lesion. IMPRESSION: 1. Nonobstructing 6-7 mm stone in the lower pole collecting system of the left kidney. No evidence of hydronephrosis or ureteral stones. 2. Hepatic steatosis. Electronically Signed   By: Malachy Moan M.D.   On: 09/29/2022 09:11    Pertinent labs & imaging results that were available during my care of the patient were reviewed by me and considered  in my medical decision making (see MDM for details).  Medications Ordered in ED Medications  ondansetron (ZOFRAN-ODT) disintegrating tablet 8 mg (8 mg Oral Given 09/29/22 0833)  oxyCODONE-acetaminophen (PERCOCET/ROXICET) 5-325 MG per tablet 1 tablet (1 tablet Oral Given 09/29/22 0831)                                                                                                                                     Procedures Procedures  (including critical care time)  Medical Decision Making / ED Course   MDM:  37 year old male presented to the emergency department for flank pain.  Patient overall well-appearing, physical examination with no CVA tenderness, no abdominal tenderness.  Vital signs reassuring.  Suspect symptoms most likely due to recurrent kidney stone.  Patient's urinalysis shows hematuria without signs of infection other than rare bacteria.  He has no symptoms of UTI very low concern for urinary infection.  Will obtain CT to evaluate for renal stone or other acute process.  He already took naproxen and the symptoms are much better but he still has some mild pain and nausea so we will give additional pain control.  Will reassess.  Clinical Course as of 09/29/22 0940  Wynelle Link Sep 29, 2022  1610 CT scan with no obstructing stone or hydronephrosis.  His pain has significantly improved and nearly resolved.  Suspect he probably already passed the stone. Will discharge patient to home. All questions answered. Patient comfortable with plan of discharge. Return precautions discussed with patient and specified on the after visit summary.  [WS]    Clinical Course User Index [WS] Lonell Grandchild, MD      Lab Tests: -I ordered, reviewed, and interpreted labs.   The pertinent results include:   Labs Reviewed  URINALYSIS, ROUTINE W REFLEX MICROSCOPIC - Abnormal; Notable for the following components:      Result Value   APPearance CLOUDY (*)    Hgb urine dipstick LARGE (*)     Protein, ur 30 (*)    All other components within normal limits  URINALYSIS, MICROSCOPIC (REFLEX) - Abnormal; Notable for the  following components:   Bacteria, UA RARE (*)    All other components within normal limits     Imaging Studies ordered: I ordered imaging studies including CT renal stone On my interpretation imaging demonstrates no obstructing stone  I independently visualized and interpreted imaging. I agree with the radiologist interpretation   Medicines ordered and prescription drug management: Meds ordered this encounter  Medications   ondansetron (ZOFRAN-ODT) disintegrating tablet 8 mg   oxyCODONE-acetaminophen (PERCOCET/ROXICET) 5-325 MG per tablet 1 tablet   ondansetron (ZOFRAN) 4 MG tablet    Sig: Take 1 tablet (4 mg total) by mouth every 8 (eight) hours as needed for nausea or vomiting.    Dispense:  12 tablet    Refill:  0    -I have reviewed the patients home medicines and have made adjustments as needed   Social Determinants of Health:  Diagnosis or treatment significantly limited by social determinants of health: obesity   Reevaluation: After the interventions noted above, I reevaluated the patient and found that their symptoms have resolved  Co morbidities that complicate the patient evaluation  Past Medical History:  Diagnosis Date   Abnormal LFTs     Anxiety and depression 04/18/2013   Cardiac murmur 12/01/2020   Chest pain 12/01/2020   Chest tightness 10/30/2020   Essential hypertension 02/07/2013   Family history of adverse reaction to anesthesia    father is hard to wake after anesthesia   Gastroesophageal reflux disease 04/20/2020   Hyperlipidemia 02/07/2013   Ingrown toenail of both feet 07/05/2018   Kidney stones    Neck pain 08/02/2019   Obesity (BMI 30-39.9) 08/02/2019   Obesity (BMI 35.0-39.9 without comorbidity) 12/01/2020   Preventative health care 04/18/2013   Seborrhea 03/08/2019   Vitamin B12 deficiency 09/12/2016   Vitamin D  deficiency 03/08/2019   Wolff-Parkinson-White (WPW) syndrome 02/17/2013   S/p ablation.      Dispostion: Disposition decision including need for hospitalization was considered, and patient discharged from emergency department.    Final Clinical Impression(s) / ED Diagnoses Final diagnoses:  Left flank pain  Hematuria, unspecified type     This chart was dictated using voice recognition software.  Despite best efforts to proofread,  errors can occur which can change the documentation meaning.    Lonell Grandchild, MD 09/29/22 [phone]

## 2022-09-30 ENCOUNTER — Encounter: Payer: Self-pay | Admitting: Family Medicine

## 2022-10-04 ENCOUNTER — Telehealth (INDEPENDENT_AMBULATORY_CARE_PROVIDER_SITE_OTHER): Payer: 59 | Admitting: Family Medicine

## 2022-10-04 ENCOUNTER — Other Ambulatory Visit (HOSPITAL_COMMUNITY): Payer: Self-pay

## 2022-10-04 ENCOUNTER — Other Ambulatory Visit: Payer: Self-pay

## 2022-10-04 DIAGNOSIS — F419 Anxiety disorder, unspecified: Secondary | ICD-10-CM | POA: Diagnosis not present

## 2022-10-04 DIAGNOSIS — F32A Depression, unspecified: Secondary | ICD-10-CM

## 2022-10-04 DIAGNOSIS — R41840 Attention and concentration deficit: Secondary | ICD-10-CM | POA: Diagnosis not present

## 2022-10-04 DIAGNOSIS — E538 Deficiency of other specified B group vitamins: Secondary | ICD-10-CM | POA: Diagnosis not present

## 2022-10-04 DIAGNOSIS — I10 Essential (primary) hypertension: Secondary | ICD-10-CM | POA: Diagnosis not present

## 2022-10-04 DIAGNOSIS — R739 Hyperglycemia, unspecified: Secondary | ICD-10-CM

## 2022-10-04 DIAGNOSIS — L989 Disorder of the skin and subcutaneous tissue, unspecified: Secondary | ICD-10-CM | POA: Diagnosis not present

## 2022-10-04 DIAGNOSIS — E559 Vitamin D deficiency, unspecified: Secondary | ICD-10-CM

## 2022-10-04 DIAGNOSIS — N200 Calculus of kidney: Secondary | ICD-10-CM | POA: Diagnosis not present

## 2022-10-04 MED ORDER — METHYLPHENIDATE HCL 10 MG PO TABS
10.0000 mg | ORAL_TABLET | Freq: Two times a day (BID) | ORAL | 0 refills | Status: DC | PRN
  Filled 2022-10-04: qty 60, 30d supply, fill #0

## 2022-10-04 MED ORDER — OXYCODONE-ACETAMINOPHEN 5-325 MG PO TABS
1.0000 | ORAL_TABLET | ORAL | 0 refills | Status: AC | PRN
  Filled 2022-10-04: qty 10, 2d supply, fill #0

## 2022-10-05 ENCOUNTER — Other Ambulatory Visit (HOSPITAL_COMMUNITY): Payer: Self-pay

## 2022-10-05 ENCOUNTER — Other Ambulatory Visit: Payer: Self-pay | Admitting: Family Medicine

## 2022-10-05 DIAGNOSIS — I10 Essential (primary) hypertension: Secondary | ICD-10-CM

## 2022-10-05 DIAGNOSIS — I456 Pre-excitation syndrome: Secondary | ICD-10-CM

## 2022-10-07 ENCOUNTER — Encounter: Payer: Self-pay | Admitting: Urology

## 2022-10-07 ENCOUNTER — Encounter: Payer: Self-pay | Admitting: Family Medicine

## 2022-10-07 ENCOUNTER — Ambulatory Visit: Payer: 59 | Admitting: Urology

## 2022-10-07 ENCOUNTER — Other Ambulatory Visit: Payer: Self-pay

## 2022-10-07 VITALS — BP 145/95 | HR 74 | Ht 69.0 in | Wt 250.0 lb

## 2022-10-07 DIAGNOSIS — L989 Disorder of the skin and subcutaneous tissue, unspecified: Secondary | ICD-10-CM | POA: Insufficient documentation

## 2022-10-07 DIAGNOSIS — N200 Calculus of kidney: Secondary | ICD-10-CM

## 2022-10-07 LAB — URINALYSIS, ROUTINE W REFLEX MICROSCOPIC
Bilirubin, UA: NEGATIVE
Glucose, UA: NEGATIVE
Ketones, UA: NEGATIVE
Leukocytes,UA: NEGATIVE
Nitrite, UA: NEGATIVE
Protein,UA: NEGATIVE
Specific Gravity, UA: 1.025 (ref 1.005–1.030)
Urobilinogen, Ur: 2 mg/dL — ABNORMAL HIGH (ref 0.2–1.0)
pH, UA: 7 (ref 5.0–7.5)

## 2022-10-07 LAB — MICROSCOPIC EXAMINATION
Bacteria, UA: NONE SEEN
Epithelial Cells (non renal): NONE SEEN /HPF (ref 0–10)
WBC, UA: NONE SEEN /HPF (ref 0–5)

## 2022-10-07 NOTE — Progress Notes (Signed)
Assessment: 1. Nephrolithiasis, left     Plan: I personally reviewed the patient's chart including provider notes, lab and imaging results. I have personally reviewed the CT study from 09/29/2022 with results as noted below. I discussed management of the nonobstructing left renal calculus with observation versus shockwave lithotripsy.  He would like to monitor at the present time. Return to office in 6 months with KUB Stone prevention discussed.  Chief Complaint:  Chief Complaint  Patient presents with   Nephrolithiasis    History of Present Illness:  Nicholas Robertson is a 37 y.o. male who is seen in consultation from Bradd Canary, MD for evaluation of nephrolithiasis.  He presented to the emergency room on 09/29/2022 with acute onset of left-sided flank pain.  Urinalysis showed >50 RBCs.  CT imaging showed a 6-7 mm stone in the lower pole of the left kidney without obstruction and no other renal or ureteral calculi.  His pain improved while in the ER.  He had not had any further flank pain.  He is not aware of passing a stone.  No dysuria or gross hematuria.  He has a prior history of nephrolithiasis undergoing lithotripsy x 2.  His stones are calcium oxalate by history.  His last stone episode was in 2019.   Past Medical History:  Past Medical History:  Diagnosis Date   Abnormal LFTs     Anxiety and depression 04/18/2013   Cardiac murmur 12/01/2020   Chest pain 12/01/2020   Chest tightness 10/30/2020   Essential hypertension 02/07/2013   Family history of adverse reaction to anesthesia    father is hard to wake after anesthesia   Gastroesophageal reflux disease 04/20/2020   Hyperlipidemia 02/07/2013   Ingrown toenail of both feet 07/05/2018   Kidney stones    Neck pain 08/02/2019   Obesity (BMI 30-39.9) 08/02/2019   Obesity (BMI 35.0-39.9 without comorbidity) 12/01/2020   Preventative health care 04/18/2013   Seborrhea 03/08/2019   Vitamin B12 deficiency 09/12/2016    Vitamin D deficiency 03/08/2019   Wolff-Parkinson-White (WPW) syndrome 02/17/2013   S/p ablation.    Past Surgical History:  Past Surgical History:  Procedure Laterality Date   ABLATION  03/09/2013   EPS and RFCA of manifest right posteroloateral accessory pathway   EXTRACORPOREAL SHOCK WAVE LITHOTRIPSY Left 11/08/2019   Procedure: EXTRACORPOREAL SHOCK WAVE LITHOTRIPSY (ESWL);  Surgeon: Sebastian Ache, MD;  Location[facility];  Service: Urology;  Laterality: Left;   FLEXIBLE SIGMOIDOSCOPY  2013   [facility] [place]   SIGMOIDOSCOPY     SUPRAVENTRICULAR TACHYCARDIA ABLATION N/A 03/08/2013   Procedure: WPW ABLATION;  Surgeon: Marinus Maw, MD;  Location: [facility] CATH LAB;  Service: Cardiovascular;  Laterality: N/A;    Allergies:  No Known Allergies  Family History:  Family History  Problem Relation Age of Onset   Hypertension Mother    Hyperlipidemia Mother    Mental illness Mother        anxiety   Other Mother        glucose intolerant   Colon polyps Mother    Diabetes Father        2   Hypertension Father    Arthritis Father    Colon polyps Father    Psoriasis Maternal Grandmother    Hypertension Maternal Grandmother    Cancer Maternal Grandmother        lymphoma   Hyperlipidemia Maternal Grandfather    Heart disease Maternal Grandfather  cabg   Hypertension Maternal Grandfather    Polymyalgia rheumatica Maternal Grandfather    Hypertension Paternal Grandmother    Diabetes Paternal Grandmother        2   COPD Paternal Grandmother    Heart disease Paternal Grandfather        s/p MI, s/p CABG   Hyperlipidemia Paternal Grandfather    Hypertension Paternal Grandfather    Diabetes Paternal Grandfather        1   Colon cancer Neg Hx    Esophageal cancer Neg Hx    Rectal cancer Neg Hx    Stomach cancer Neg Hx     Social History:  Social History   Tobacco Use   Smoking status: Never   Smokeless tobacco: Never   Vaping Use   Vaping status: Never Used  Substance Use Topics   Alcohol use: No   Drug use: No    Review of symptoms:  Constitutional:  Negative for unexplained weight loss, night sweats, fever, chills ENT:  Negative for nose bleeds, sinus pain, painful swallowing CV:  Negative for chest pain, shortness of breath, exercise intolerance, palpitations, loss of consciousness Resp:  Negative for cough, wheezing, shortness of breath GI:  Negative for nausea, vomiting, diarrhea, bloody stools GU:  Positives noted in HPI; otherwise negative for gross hematuria, dysuria, urinary incontinence Neuro:  Negative for seizures, poor balance, limb weakness, slurred speech Psych:  Negative for lack of energy, depression, anxiety Endocrine:  Negative for polydipsia, polyuria, symptoms of hypoglycemia (dizziness, hunger, sweating) Hematologic:  Negative for anemia, purpura, petechia, prolonged or excessive bleeding, use of anticoagulants  Allergic:  Negative for difficulty breathing or choking as a result of exposure to anything; no shellfish allergy; no allergic response (rash/itch) to materials, foods  Physical exam: BP (!) 145/95   Pulse 74   Ht 5\' 9"  (1.753 m)   Wt 250 lb (113.4 kg)   BMI 36.92 kg/m  GENERAL APPEARANCE:  Well appearing, well developed, well nourished, NAD HEENT: Atraumatic, Normocephalic, oropharynx clear. NECK: Supple without lymphadenopathy or thyromegaly. LUNGS: Clear to auscultation bilaterally. HEART: Regular Rate and Rhythm without murmurs, gallops, or rubs. ABDOMEN: Soft, non-tender, No Masses. EXTREMITIES: Moves all extremities well.  Without clubbing, cyanosis, or edema. NEUROLOGIC:  Alert and oriented x 3, normal gait, CN II-XII grossly intact.  MENTAL STATUS:  Appropriate. BACK:  Non-tender to palpation.  No CVAT SKIN:  Warm, dry and intact.    Results: U/A:  0-2 RBC

## 2022-10-07 NOTE — Assessment & Plan Note (Signed)
Supplement and monitor 

## 2022-10-07 NOTE — Assessment & Plan Note (Signed)
Is doing well on Ritalin and feels his ability to complete tasks is improved at home and at work.

## 2022-10-07 NOTE — Assessment & Plan Note (Signed)
Referred to dermatology for further consideration.  

## 2022-10-07 NOTE — Assessment & Plan Note (Signed)
Feels his anxiety is improved with the addition of Ritalin. No changes

## 2022-10-07 NOTE — Assessment & Plan Note (Signed)
hgba1c acceptable, minimize simple carbs. Increase exercise as tolerated.  

## 2022-10-07 NOTE — Progress Notes (Signed)
MyChart Video Visit    Virtual Visit via Video Note   This patient is at least at moderate risk for complications without adequate follow up. This format is felt to be most appropriate for this patient at this time. Physical exam was limited by quality of the video and audio technology used for the visit. CMA was able to get the patient set up on a video visit.  Patient location: home Patient and provider in visit Provider location: Office  I discussed the limitations of evaluation and management by telemedicine and the availability of in person appointments. The patient expressed understanding and agreed to proceed.  Visit Date: 10/04/2022  Today's healthcare provider: Danise Edge, MD     Subjective:    Patient ID: Nicholas Robertson, male    DOB: 01/04/1986, 37 y.o.   MRN: [id number]  Chief Complaint  Patient presents with   Follow-up    Follow up    HPI Discussed the use of AI scribe software for clinical note transcription with the patient, who gave verbal consent to proceed.  History of Present Illness   The patient, with a history of recurrent kidney stones, presents with dysuria. They describe an occasional twinge of pain in the anterior suprapubic region during urination, and a sensation of something wanting to come out but doesn't. The patient has had about six to seven episodes of kidney stones since they were 18, with the last episode occurring in 2019. They have undergone two lithotripsies, and the rest of the stones were passed naturally.  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 The patient expresses a desire for a dermatology referral for evaluation and possible removal of the lipomas for cosmetic reasons.  The patient's blood pressure has been monitored and is under control, with systolic readings ranging from the upper 110s to 132, and  diastolic readings in the 70s to lower 80s. The patient is also on Ritalin, which they take less than the prescribed dose.        Past Medical History:  Diagnosis Date   Abnormal LFTs     Anxiety and depression 04/18/2013   Cardiac murmur 12/01/2020   Chest pain 12/01/2020   Chest tightness 10/30/2020   Essential hypertension 02/07/2013   Family history of adverse reaction to anesthesia    father is hard to wake after anesthesia   Gastroesophageal reflux disease 04/20/2020   Hyperlipidemia 02/07/2013   Ingrown toenail of both feet 07/05/2018   Kidney stones    Neck pain 08/02/2019   Obesity (BMI 30-39.9) 08/02/2019   Obesity (BMI 35.0-39.9 without comorbidity) 12/01/2020   Preventative health care 04/18/2013   Seborrhea 03/08/2019   Vitamin B12 deficiency 09/12/2016   Vitamin D deficiency 03/08/2019   Wolff-Parkinson-White (WPW) syndrome 02/17/2013   S/p ablation.    Past Surgical History:  Procedure Laterality Date   ABLATION  03/09/2013   EPS and RFCA of manifest right posteroloateral accessory pathway   EXTRACORPOREAL SHOCK WAVE LITHOTRIPSY Left 11/08/2019   Procedure: EXTRACORPOREAL SHOCK WAVE LITHOTRIPSY (ESWL);  Surgeon: Sebastian Ache, MD;  Location: [facility];  Service: Urology;  Laterality: Left;   FLEXIBLE SIGMOIDOSCOPY  2013   [facility] [place]   SIGMOIDOSCOPY     SUPRAVENTRICULAR TACHYCARDIA ABLATION N/A 03/08/2013   Procedure: WPW ABLATION;  Surgeon: Marinus Maw, MD;  Location: [facility] CATH  LAB;  Service: Cardiovascular;  Laterality: N/A;    Family History  Problem Relation Age of Onset   Hypertension Mother    Hyperlipidemia Mother    Mental illness Mother        anxiety   Other Mother        glucose intolerant   Colon polyps Mother    Diabetes Father        2   Hypertension Father    Arthritis Father    Colon polyps Father    Psoriasis Maternal Grandmother    Hypertension Maternal Grandmother    Cancer  Maternal Grandmother        lymphoma   Hyperlipidemia Maternal Grandfather    Heart disease Maternal Grandfather        cabg   Hypertension Maternal Grandfather    Polymyalgia rheumatica Maternal Grandfather    Hypertension Paternal Grandmother    Diabetes Paternal Grandmother        2   COPD Paternal Grandmother    Heart disease Paternal Grandfather        s/p MI, s/p CABG   Hyperlipidemia Paternal Grandfather    Hypertension Paternal Grandfather    Diabetes Paternal Grandfather        1   Colon cancer Neg Hx    Esophageal cancer Neg Hx    Rectal cancer Neg Hx    Stomach cancer Neg Hx     Social History   Socioeconomic History   Marital status: Married    Spouse name: Not on file   Number of children: Not on file   Years of education: Not on file   Highest education level: Not on file  Occupational History   Not on file  Tobacco Use   Smoking status: Never   Smokeless tobacco: Never  Vaping Use   Vaping status: Never Used  Substance and Sexual Activity   Alcohol use: No   Drug use: No   Sexual activity: Yes    Comment: no dietary restricitons, lives alone with cat, seat belts  Other Topics Concern   Not on file  Social History Narrative   Works at [employer], as PA   Lives with partner   No major dietary restrictions.    Pet: cat, dog   Social Determinants of Health   Financial Resource Strain: Not on file  Food Insecurity: Not on file  Transportation Needs: Not on file  Physical Activity: Not on file  Stress: Not on file  Social Connections: Not on file  Intimate Partner Violence: Not on file    Outpatient Medications Prior to Visit  Medication Sig Dispense Refill   amLODipine (NORVASC) 5 MG tablet Take 1 tablet (5 mg total) by mouth daily. 90 tablet 3   cyanocobalamin 1000 MCG tablet Take 1,000 mcg by mouth daily.     famotidine (PEPCID) 40 MG tablet Take 1 tablet (40 mg total) by mouth daily. 90 tablet 1   metoprolol succinate (TOPROL-XL) 50  MG 24 hr tablet Take 1 tablet (50 mg total) by mouth daily with or immediately following a meal. 90 tablet 3   pantoprazole (PROTONIX) 40 MG tablet Take 1 tablet (40 mg total) by mouth daily. 90 tablet 1   rosuvastatin (CRESTOR) 5 MG tablet Take 1 tablet (5 mg total) by mouth daily. 90 tablet 3   valsartan-hydrochlorothiazide (DIOVAN-HCT) 320-25 MG tablet Take 1 tablet by mouth daily. 90 tablet 1   vortioxetine HBr (TRINTELLIX) 20 MG TABS tablet Take 1 tablet (20  mg total) by mouth daily. 90 tablet 1   methylphenidate (RITALIN) 10 MG tablet Take 1 tablet (10 mg total) by mouth 2 (two) times daily as needed. 60 tablet 0   ondansetron (ZOFRAN) 4 MG tablet Take 1 tablet (4 mg total) by mouth every 8 (eight) hours as needed for nausea or vomiting. 12 tablet 0   No facility-administered medications prior to visit.    No Known Allergies  Review of Systems  Constitutional:  Negative for fever and malaise/fatigue.  HENT:  Negative for congestion.   Eyes:  Negative for blurred vision.  Respiratory:  Negative for shortness of breath.   Cardiovascular:  Negative for chest pain, palpitations and leg swelling.  Gastrointestinal:  Negative for abdominal pain, blood in stool and nausea.  Genitourinary:  Positive for dysuria. Negative for frequency.  Musculoskeletal:  Negative for falls.  Skin:  Negative for rash.  Neurological:  Negative for dizziness, loss of consciousness and headaches.  Endo/Heme/Allergies:  Negative for environmental allergies.  Psychiatric/Behavioral:  Negative for depression. The patient is not nervous/anxious.        Objective:    Physical Exam Vitals reviewed.  Constitutional:      Appearance: Normal appearance. He is not ill-appearing.  HENT:     Head: Normocephalic and atraumatic.     Nose: Nose normal.  Eyes:     Conjunctiva/sclera: Conjunctivae normal.  Pulmonary:     Effort: Pulmonary effort is normal.  Abdominal:     Palpations: Abdomen is soft.   Musculoskeletal:     Cervical back: Normal range of motion.  Neurological:     Mental Status: He is alert and oriented to person, place, and time. Mental status is at baseline.  Psychiatric:        Mood and Affect: Mood normal.     There were no vitals taken for this visit. Wt Readings from Last 3 Encounters:  10/07/22 250 lb (113.4 kg)  08/15/22 253 lb (114.8 kg)  04/15/22 245 lb (111.1 kg)       Assessment & Plan:  Recurrent kidney stones Assessment & Plan: Hydrate and monitor referred to urology for further evaluation and treatment given a small number of Oxycodone in case his pain returns.  Orders: -     Ambulatory referral to Urology  Skin lesion Assessment & Plan: Referred to dermatology for further consideration  Orders: -     Ambulatory referral to Dermatology  Vitamin D deficiency Assessment & Plan: Supplement and monitor    Vitamin B12 deficiency Assessment & Plan: Supplement and monitor    Nephrolithiasis Assessment & Plan: Hydrate and monitor referred to urology for further evaluation and treatment given a small number of Oxycodone in case his pain returns.   Hyperglycemia Assessment & Plan: hgba1c acceptable, minimize simple carbs. Increase exercise as tolerated   Essential hypertension Assessment & Plan: Well controlled, no changes to meds. Encouraged heart healthy diet such as the DASH diet and exercise as tolerated   Attention or concentration deficit Assessment & Plan: Is doing well on Ritalin and feels his ability to complete tasks is improved at home and at work.    Anxiety and depression Assessment & Plan: Feels his anxiety is improved with the addition of Ritalin. No changes   Other orders -     Methylphenidate HCl; Take 1 tablet (10 mg total) by mouth 2 (two) times daily as needed.  Dispense: 60 tablet; Refill: 0 -     oxyCODONE-Acetaminophen; Take 1 tablet by mouth every  4 (four) hours as needed for up to 5 days for severe  pain.  Dispense: 10 tablet; Refill: 0     Assessment and Plan    Recurrent Kidney Stones No current flank pain, fever, or chills. Reports occasional super pubic pain and sensation of something wanting to come out during urination. No frank hematuria. History of multiple episodes since age 89, with two requiring lithotripsy. -Referral to [facility] for further evaluation and management. -Provide 10 tablets of Percocet 5/325 for potential pain management, to be used as needed. -Consider cranberry tablets for potential prevention of urinary tract infections.  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 -Referral to dermatology for evaluation and potential removal.  Hypertension Blood pressure appears to be well-controlled with current regimen, with systolic readings ranging from upper 110s to low 130s and diastolic readings in the 70s. -Continue current management and monitor blood pressure.  ADHD Current prescription for Ritalin due for refill. -Send refill for Ritalin to [facility].  Follow-up Lab appointment scheduled for 11/15/2022 and office visit scheduled for 11/18/2022.         I discussed the assessment and treatment plan with the patient. The patient was provided an opportunity to ask questions and all were answered. The patient agreed with the plan and demonstrated an understanding of the instructions.   The patient was advised to call back or seek an in-person evaluation if the symptoms worsen or if the condition fails to improve as anticipated.  Danise Edge, MD [facility] at [facility] [phone] (phone) [phone] (fax)  [facility]

## 2022-10-07 NOTE — Assessment & Plan Note (Signed)
Well controlled, no changes to meds. Encouraged heart healthy diet such as the DASH diet and exercise as tolerated.  °

## 2022-10-07 NOTE — Assessment & Plan Note (Addendum)
Hydrate and monitor referred to urology for further evaluation and treatment given a small number of Oxycodone in case his pain returns.

## 2022-10-21 DIAGNOSIS — D2371 Other benign neoplasm of skin of right lower limb, including hip: Secondary | ICD-10-CM | POA: Diagnosis not present

## 2022-10-21 DIAGNOSIS — A630 Anogenital (venereal) warts: Secondary | ICD-10-CM | POA: Diagnosis not present

## 2022-10-21 DIAGNOSIS — D1724 Benign lipomatous neoplasm of skin and subcutaneous tissue of left leg: Secondary | ICD-10-CM | POA: Diagnosis not present

## 2022-10-21 DIAGNOSIS — D1723 Benign lipomatous neoplasm of skin and subcutaneous tissue of right leg: Secondary | ICD-10-CM | POA: Diagnosis not present

## 2022-10-22 ENCOUNTER — Encounter: Payer: Self-pay | Admitting: Family Medicine

## 2022-10-23 ENCOUNTER — Other Ambulatory Visit (HOSPITAL_COMMUNITY): Payer: Self-pay

## 2022-10-23 ENCOUNTER — Other Ambulatory Visit: Payer: Self-pay | Admitting: Family Medicine

## 2022-10-23 MED ORDER — TELMISARTAN-HCTZ 80-25 MG PO TABS
1.0000 | ORAL_TABLET | Freq: Every day | ORAL | 1 refills | Status: DC
  Filled 2022-10-23: qty 90, 90d supply, fill #0
  Filled 2023-01-12 – 2023-01-13 (×2): qty 90, 90d supply, fill #1

## 2022-10-24 ENCOUNTER — Other Ambulatory Visit: Payer: Self-pay

## 2022-10-27 ENCOUNTER — Other Ambulatory Visit: Payer: Self-pay | Admitting: Family Medicine

## 2022-10-27 DIAGNOSIS — I10 Essential (primary) hypertension: Secondary | ICD-10-CM

## 2022-10-27 DIAGNOSIS — I456 Pre-excitation syndrome: Secondary | ICD-10-CM

## 2022-10-30 ENCOUNTER — Encounter: Payer: Self-pay | Admitting: Family Medicine

## 2022-10-30 DIAGNOSIS — I10 Essential (primary) hypertension: Secondary | ICD-10-CM

## 2022-10-30 DIAGNOSIS — I456 Pre-excitation syndrome: Secondary | ICD-10-CM

## 2022-10-31 ENCOUNTER — Other Ambulatory Visit: Payer: Self-pay

## 2022-10-31 MED ORDER — METOPROLOL SUCCINATE ER 50 MG PO TB24
50.0000 mg | ORAL_TABLET | Freq: Every day | ORAL | 3 refills | Status: DC
  Filled 2022-10-31: qty 90, 90d supply, fill #0
  Filled 2023-01-12 – 2023-02-04 (×2): qty 90, 90d supply, fill #1

## 2022-11-01 ENCOUNTER — Other Ambulatory Visit: Payer: Self-pay

## 2022-11-01 NOTE — Progress Notes (Signed)
Patient attempted to be outreached by Sofie Rower, PharmD to discuss hypertension. Patient requested I call back on Monday, November 04, 2022.  Sofie Rower, PharmD [employer] PGY1

## 2022-11-15 ENCOUNTER — Other Ambulatory Visit (INDEPENDENT_AMBULATORY_CARE_PROVIDER_SITE_OTHER): Payer: 59

## 2022-11-15 ENCOUNTER — Ambulatory Visit (INDEPENDENT_AMBULATORY_CARE_PROVIDER_SITE_OTHER): Payer: 59

## 2022-11-15 DIAGNOSIS — Z23 Encounter for immunization: Secondary | ICD-10-CM | POA: Diagnosis not present

## 2022-11-15 DIAGNOSIS — E559 Vitamin D deficiency, unspecified: Secondary | ICD-10-CM

## 2022-11-15 DIAGNOSIS — E538 Deficiency of other specified B group vitamins: Secondary | ICD-10-CM | POA: Diagnosis not present

## 2022-11-15 DIAGNOSIS — E782 Mixed hyperlipidemia: Secondary | ICD-10-CM

## 2022-11-15 DIAGNOSIS — I10 Essential (primary) hypertension: Secondary | ICD-10-CM

## 2022-11-15 DIAGNOSIS — R739 Hyperglycemia, unspecified: Secondary | ICD-10-CM

## 2022-11-15 LAB — CBC WITH DIFFERENTIAL/PLATELET
Basophils Absolute: 0 K/uL (ref 0.0–0.1)
Basophils Relative: 0.4 % (ref 0.0–3.0)
Eosinophils Absolute: 0.1 K/uL (ref 0.0–0.7)
Eosinophils Relative: 1.2 % (ref 0.0–5.0)
HCT: 48.2 % (ref 39.0–52.0)
Hemoglobin: 16.2 g/dL (ref 13.0–17.0)
Lymphocytes Relative: 33.8 % (ref 12.0–46.0)
Lymphs Abs: 2.4 K/uL (ref 0.7–4.0)
MCHC: 33.6 g/dL (ref 30.0–36.0)
MCV: 91.1 fl (ref 78.0–100.0)
Monocytes Absolute: 0.6 K/uL (ref 0.1–1.0)
Monocytes Relative: 8 % (ref 3.0–12.0)
Neutro Abs: 3.9 K/uL (ref 1.4–7.7)
Neutrophils Relative %: 56.6 % (ref 43.0–77.0)
Platelets: 237 K/uL (ref 150.0–400.0)
RBC: 5.29 Mil/uL (ref 4.22–5.81)
RDW: 12.8 % (ref 11.5–15.5)
WBC: 7 K/uL (ref 4.0–10.5)

## 2022-11-15 LAB — TSH: TSH: 3.08 u[IU]/mL (ref 0.35–5.50)

## 2022-11-15 LAB — LIPID PANEL
Cholesterol: 180 mg/dL (ref 0–200)
HDL: 48.1 mg/dL
LDL Cholesterol: 108 mg/dL — ABNORMAL HIGH (ref 0–99)
NonHDL: 131.89
Total CHOL/HDL Ratio: 4
Triglycerides: 120 mg/dL (ref 0.0–149.0)
VLDL: 24 mg/dL (ref 0.0–40.0)

## 2022-11-15 LAB — COMPREHENSIVE METABOLIC PANEL
ALT: 58 U/L — ABNORMAL HIGH (ref 0–53)
AST: 31 U/L (ref 0–37)
Albumin: 4.5 g/dL (ref 3.5–5.2)
Alkaline Phosphatase: 64 U/L (ref 39–117)
BUN: 10 mg/dL (ref 6–23)
CO2: 32 meq/L (ref 19–32)
Calcium: 9.7 mg/dL (ref 8.4–10.5)
Chloride: 99 meq/L (ref 96–112)
Creatinine, Ser: 0.97 mg/dL (ref 0.40–1.50)
GFR: 99.95 mL/min (ref >60.00)
Glucose, Bld: 117 mg/dL — ABNORMAL HIGH (ref 70–99)
Potassium: 3.9 meq/L (ref 3.5–5.1)
Sodium: 140 meq/L (ref 135–145)
Total Bilirubin: 0.8 mg/dL (ref 0.2–1.2)
Total Protein: 6.9 g/dL (ref 6.0–8.3)

## 2022-11-15 LAB — VITAMIN B12: Vitamin B-12: 297 pg/mL (ref 211–911)

## 2022-11-15 LAB — HEMOGLOBIN A1C: Hgb A1c MFr Bld: 6.3 % (ref 4.6–6.5)

## 2022-11-15 LAB — VITAMIN D 25 HYDROXY (VIT D DEFICIENCY, FRACTURES): VITD: 24.61 ng/mL — ABNORMAL LOW (ref 30.00–100.00)

## 2022-11-17 NOTE — Assessment & Plan Note (Signed)
Encouraged DASH or MIND diet, decrease po intake and increase exercise as tolerated. Needs 7-8 hours of sleep nightly. Avoid trans fats, eat small, frequent meals every 4-5 hours with lean proteins, complex carbs and healthy fats. Minimize simple carbs, high fat foods and processed foods 

## 2022-11-17 NOTE — Assessment & Plan Note (Signed)
Supplement and monitor 

## 2022-11-17 NOTE — Assessment & Plan Note (Signed)
Fatty liver via ultrasound. Minimize simple carbs and processed foods, stay active

## 2022-11-17 NOTE — Assessment & Plan Note (Signed)
hgba1c acceptable, minimize simple carbs. Increase exercise as tolerated.  

## 2022-11-17 NOTE — Assessment & Plan Note (Signed)
Did not tolerate Atorvastatin. Tolerating Rosuvastatin. Encourage heart healthy diet such as MIND or DASH diet, increase exercise, avoid trans fats, simple carbohydrates and processed foods, consider a krill or fish or flaxseed oil cap daily.

## 2022-11-17 NOTE — Assessment & Plan Note (Signed)
Well controlled, no changes to meds. Encouraged heart healthy diet such as the DASH diet and exercise as tolerated.  °

## 2022-11-18 ENCOUNTER — Encounter: Payer: Self-pay | Admitting: Family Medicine

## 2022-11-18 ENCOUNTER — Telehealth (INDEPENDENT_AMBULATORY_CARE_PROVIDER_SITE_OTHER): Payer: 59 | Admitting: Family Medicine

## 2022-11-18 ENCOUNTER — Other Ambulatory Visit (HOSPITAL_COMMUNITY): Payer: Self-pay

## 2022-11-18 DIAGNOSIS — E538 Deficiency of other specified B group vitamins: Secondary | ICD-10-CM

## 2022-11-18 DIAGNOSIS — E782 Mixed hyperlipidemia: Secondary | ICD-10-CM

## 2022-11-18 DIAGNOSIS — E669 Obesity, unspecified: Secondary | ICD-10-CM | POA: Diagnosis not present

## 2022-11-18 DIAGNOSIS — I10 Essential (primary) hypertension: Secondary | ICD-10-CM | POA: Diagnosis not present

## 2022-11-18 DIAGNOSIS — R739 Hyperglycemia, unspecified: Secondary | ICD-10-CM | POA: Diagnosis not present

## 2022-11-18 DIAGNOSIS — E559 Vitamin D deficiency, unspecified: Secondary | ICD-10-CM

## 2022-11-18 DIAGNOSIS — R7989 Other specified abnormal findings of blood chemistry: Secondary | ICD-10-CM

## 2022-11-18 MED ORDER — METHYLPHENIDATE HCL 10 MG PO TABS
10.0000 mg | ORAL_TABLET | Freq: Two times a day (BID) | ORAL | 0 refills | Status: DC | PRN
  Filled 2022-11-18: qty 60, 30d supply, fill #0

## 2022-11-18 MED ORDER — METHYLPHENIDATE HCL 10 MG PO TABS
10.0000 mg | ORAL_TABLET | Freq: Two times a day (BID) | ORAL | 0 refills | Status: DC
  Filled 2022-11-18 – 2023-04-24 (×2): qty 60, 30d supply, fill #0

## 2022-11-18 MED ORDER — METHYLPHENIDATE HCL 10 MG PO TABS
10.0000 mg | ORAL_TABLET | Freq: Two times a day (BID) | ORAL | 0 refills | Status: DC
  Filled 2022-11-18: qty 60, 30d supply, fill #0

## 2022-11-18 MED ORDER — ESOMEPRAZOLE MAGNESIUM 20 MG PO CPDR
20.0000 mg | DELAYED_RELEASE_CAPSULE | Freq: Every day | ORAL | 1 refills | Status: DC
  Filled 2022-11-18: qty 90, 90d supply, fill #0
  Filled 2023-03-24: qty 90, 90d supply, fill #1

## 2022-11-18 MED ORDER — VITAMIN D 125 MCG (5000 UT) PO CAPS: 1.0000 | ORAL_CAPSULE | Freq: Every day | ORAL | Status: AC

## 2022-11-18 NOTE — Progress Notes (Signed)
MyChart Video Visit    Virtual Visit via Video Note   This patient is at least at moderate risk for complications without adequate follow up. This format is felt to be most appropriate for this patient at this time. Physical exam was limited by quality of the video and audio technology used for the visit. Juanetta, CMA was able to get the patient set up on a video visit.  Patient location: home Patient and provider in visit Provider location: Office  I discussed the limitations of evaluation and management by telemedicine and the availability of in person appointments. The patient expressed understanding and agreed to proceed.  Visit Date: 11/18/2022  Today's healthcare provider: Danise Edge, MD     Subjective:    Patient ID: Nicholas Robertson, male    DOB: [birth year], 37 y.o.   MRN: [id number]  No chief complaint on file.   HPI Patient is in today for  Past Medical History:  Diagnosis Date   Abnormal LFTs     Anxiety and depression 04/18/2013   Cardiac murmur 12/01/2020   Chest pain 12/01/2020   Chest tightness 10/30/2020   Essential hypertension 02/07/2013   Family history of adverse reaction to anesthesia    father is hard to wake after anesthesia   Gastroesophageal reflux disease 04/20/2020   Hyperlipidemia 02/07/2013   Ingrown toenail of both feet 07/05/2018   Kidney stones    Neck pain 08/02/2019   Obesity (BMI 30-39.9) 08/02/2019   Obesity (BMI 35.0-39.9 without comorbidity) 12/01/2020   Preventative health care 04/18/2013   Seborrhea 03/08/2019   Vitamin B12 deficiency 09/12/2016   Vitamin D deficiency 03/08/2019   Wolff-Parkinson-White (WPW) syndrome 02/17/2013   S/p ablation.    Past Surgical History:  Procedure Laterality Date   ABLATION  03/09/2013   EPS and RFCA of manifest right posteroloateral accessory pathway   EXTRACORPOREAL SHOCK WAVE LITHOTRIPSY Left 11/08/2019   Procedure: EXTRACORPOREAL SHOCK WAVE LITHOTRIPSY (ESWL);  Surgeon: Sebastian Ache, MD;  Location: [facility];  Service: Urology;  Laterality: Left;   FLEXIBLE SIGMOIDOSCOPY  2013   [facility] [place]   SIGMOIDOSCOPY     SUPRAVENTRICULAR TACHYCARDIA ABLATION N/A 03/08/2013   Procedure: WPW ABLATION;  Surgeon: Marinus Maw, MD;  Location: [facility] CATH LAB;  Service: Cardiovascular;  Laterality: N/A;    Family History  Problem Relation Age of Onset   Hypertension Mother    Hyperlipidemia Mother    Mental illness Mother        anxiety   Other Mother        glucose intolerant   Colon polyps Mother    Diabetes Father        2   Hypertension Father    Arthritis Father    Colon polyps Father    Psoriasis Maternal Grandmother    Hypertension Maternal Grandmother    Cancer Maternal Grandmother        lymphoma   Hyperlipidemia Maternal Grandfather    Heart disease Maternal Grandfather        cabg   Hypertension Maternal Grandfather    Polymyalgia rheumatica Maternal Grandfather    Hypertension Paternal Grandmother    Diabetes Paternal Grandmother        2   COPD Paternal Grandmother    Heart disease Paternal Grandfather        s/p MI, s/p CABG   Hyperlipidemia Paternal Grandfather    Hypertension Paternal Grandfather    Diabetes Paternal Actor  1   Colon cancer Neg Hx    Esophageal cancer Neg Hx    Rectal cancer Neg Hx    Stomach cancer Neg Hx     Social History   Socioeconomic History   Marital status: Married    Spouse name: Not on file   Number of children: Not on file   Years of education: Not on file   Highest education level: Not on file  Occupational History   Not on file  Tobacco Use   Smoking status: Never   Smokeless tobacco: Never  Vaping Use   Vaping status: Never Used  Substance and Sexual Activity   Alcohol use: No   Drug use: No   Sexual activity: Yes    Comment: no dietary restricitons, lives alone with cat, seat belts  Other Topics Concern   Not on file  Social  History Narrative   Works at [employer], as PA   Lives with partner   No major dietary restrictions.    Pet: cat, dog   Social Determinants of Health   Financial Resource Strain: Not on file  Food Insecurity: Not on file  Transportation Needs: Not on file  Physical Activity: Not on file  Stress: Not on file  Social Connections: Not on file  Intimate Partner Violence: Not on file    Outpatient Medications Prior to Visit  Medication Sig Dispense Refill   amLODipine (NORVASC) 5 MG tablet Take 1 tablet (5 mg total) by mouth daily. 90 tablet 3   cyanocobalamin 1000 MCG tablet Take 1,000 mcg by mouth daily.     famotidine (PEPCID) 40 MG tablet Take 1 tablet (40 mg total) by mouth daily. 90 tablet 1   metoprolol succinate (TOPROL-XL) 50 MG 24 hr tablet Take 1 tablet (50 mg total) by mouth daily with or immediately following a meal. 90 tablet 3   rosuvastatin (CRESTOR) 5 MG tablet Take 1 tablet (5 mg total) by mouth daily. 90 tablet 3   telmisartan-hydrochlorothiazide (MICARDIS HCT) 80-25 MG tablet Take 1 tablet by mouth daily. 90 tablet 1   vortioxetine HBr (TRINTELLIX) 20 MG TABS tablet Take 1 tablet (20 mg total) by mouth daily. 90 tablet 1   methylphenidate (RITALIN) 10 MG tablet Take 1 tablet (10 mg total) by mouth 2 (two) times daily as needed. 60 tablet 0   pantoprazole (PROTONIX) 40 MG tablet Take 1 tablet (40 mg total) by mouth daily. 90 tablet 1   No facility-administered medications prior to visit.    No Known Allergies  Review of Systems  Constitutional:  Negative for fever and malaise/fatigue.  HENT:  Negative for congestion.   Eyes:  Negative for blurred vision.  Respiratory:  Negative for shortness of breath.   Cardiovascular:  Negative for chest pain, palpitations and leg swelling.  Gastrointestinal:  Negative for abdominal pain, blood in stool and nausea.  Genitourinary:  Negative for dysuria and frequency.  Musculoskeletal:  Negative for falls.  Skin:   Negative for rash.  Neurological:  Negative for dizziness, loss of consciousness and headaches.  Endo/Heme/Allergies:  Negative for environmental allergies.  Psychiatric/Behavioral:  Negative for depression. The patient is not nervous/anxious.       Objective:    Physical Exam Vitals reviewed.  Constitutional:      Appearance: Normal appearance. He is not ill-appearing.  HENT:     Head: Normocephalic and atraumatic.     Nose: Nose normal.  Eyes:     Conjunctiva/sclera: Conjunctivae normal.  Cardiovascular:  Rate and Rhythm: Normal rate.     Pulses: Normal pulses.     Heart sounds: Normal heart sounds. No murmur heard. Pulmonary:     Effort: Pulmonary effort is normal.     Breath sounds: Normal breath sounds. No wheezing.  Abdominal:     Palpations: Abdomen is soft. There is no mass.     Tenderness: There is no abdominal tenderness.  Musculoskeletal:     Cervical back: Normal range of motion.     Right lower leg: No edema.     Left lower leg: No edema.  Skin:    General: Skin is warm and dry.  Neurological:     General: No focal deficit present.     Mental Status: He is alert and oriented to person, place, and time.  Psychiatric:        Mood and Affect: Mood normal.   There were no vitals taken for this visit. Wt Readings from Last 3 Encounters:  10/07/22 250 lb (113.4 kg)  08/15/22 253 lb (114.8 kg)  04/15/22 245 lb (111.1 kg)       Assessment & Plan:  Abnormal LFTs Assessment & Plan: Fatty liver via ultrasound. Minimize simple carbs and processed foods, stay active   Hyperglycemia Assessment & Plan: hgba1c acceptable, minimize simple carbs. Increase exercise as tolerated   Mixed hyperlipidemia Assessment & Plan: Did not tolerate Atorvastatin. Tolerating Rosuvastatin. Encourage heart healthy diet such as MIND or DASH diet, increase exercise, avoid trans fats, simple carbohydrates and processed foods, consider a krill or fish or flaxseed oil cap daily.     Vitamin B12 deficiency Assessment & Plan: Supplement and monitor    Vitamin D deficiency Assessment & Plan: Supplement and monitor    Essential hypertension Assessment & Plan: Well controlled, no changes to meds. Encouraged heart healthy diet such as the DASH diet and exercise as tolerated   Obesity (BMI 30-39.9) Assessment & Plan: Encouraged DASH or MIND diet, decrease po intake and increase exercise as tolerated. Needs 7-8 hours of sleep nightly. Avoid trans fats, eat small, frequent meals every 4-5 hours with lean proteins, complex carbs and healthy fats. Minimize simple carbs, high fat foods and processed foods    Other orders -     Esomeprazole Magnesium; Take 1 capsule (20 mg total) by mouth daily at 12 noon.  Dispense: 90 capsule; Refill: 1 -     Vitamin D; Take 1 capsule by mouth daily at 12 noon. -     Methylphenidate HCl; Take 1 tablet (10 mg total) by mouth 2 (two) times daily as needed. November 2024  Dispense: 60 tablet; Refill: 0 -     Methylphenidate HCl; Take 1 tablet (10 mg total) by mouth 2 (two) times daily. December 2024  Dispense: 60 tablet; Refill: 0 -     Methylphenidate HCl; Take 1 tablet (10 mg total) by mouth 2 (two) times daily. January 2025  Dispense: 60 tablet; Refill: 0     I discussed the assessment and treatment plan with the patient. The patient was provided an opportunity to ask questions and all were answered. The patient agreed with the plan and demonstrated an understanding of the instructions.   The patient was advised to call back or seek an in-person evaluation if the symptoms worsen or if the condition fails to improve as anticipated.  Danise Edge, MD [facility] at [facility] [phone] (phone) [phone] (fax)  [facility]

## 2022-11-19 ENCOUNTER — Other Ambulatory Visit (HOSPITAL_COMMUNITY): Payer: Self-pay

## 2022-11-19 ENCOUNTER — Telehealth: Payer: Self-pay | Admitting: Emergency Medicine

## 2022-11-19 ENCOUNTER — Other Ambulatory Visit: Payer: Self-pay

## 2022-11-20 ENCOUNTER — Encounter: Payer: Self-pay | Admitting: Emergency Medicine

## 2022-11-20 NOTE — Telephone Encounter (Signed)
Good morning,  Dr. Abner Greenspan has an opening Monday February 24, 024 at 9 am. Will this time work for you?  Thank you, Rayford Halsted

## 2022-11-20 NOTE — Telephone Encounter (Signed)
Created In error

## 2022-12-09 ENCOUNTER — Other Ambulatory Visit (HOSPITAL_COMMUNITY): Payer: Self-pay

## 2022-12-09 ENCOUNTER — Other Ambulatory Visit: Payer: Self-pay

## 2022-12-25 DIAGNOSIS — F411 Generalized anxiety disorder: Secondary | ICD-10-CM | POA: Diagnosis not present

## 2023-01-03 DIAGNOSIS — F411 Generalized anxiety disorder: Secondary | ICD-10-CM | POA: Diagnosis not present

## 2023-01-09 DIAGNOSIS — F411 Generalized anxiety disorder: Secondary | ICD-10-CM | POA: Diagnosis not present

## 2023-01-13 ENCOUNTER — Other Ambulatory Visit: Payer: Self-pay

## 2023-01-16 DIAGNOSIS — F411 Generalized anxiety disorder: Secondary | ICD-10-CM | POA: Diagnosis not present

## 2023-02-04 ENCOUNTER — Other Ambulatory Visit: Payer: Self-pay | Admitting: Nurse Practitioner

## 2023-02-04 ENCOUNTER — Other Ambulatory Visit (HOSPITAL_BASED_OUTPATIENT_CLINIC_OR_DEPARTMENT_OTHER): Payer: Self-pay

## 2023-02-04 ENCOUNTER — Other Ambulatory Visit: Payer: Self-pay

## 2023-02-04 ENCOUNTER — Telehealth: Payer: Commercial Managed Care - PPO | Admitting: Nurse Practitioner

## 2023-02-04 DIAGNOSIS — J0140 Acute pansinusitis, unspecified: Secondary | ICD-10-CM

## 2023-02-04 MED ORDER — DOXYCYCLINE HYCLATE 100 MG PO TABS
100.0000 mg | ORAL_TABLET | Freq: Two times a day (BID) | ORAL | 0 refills | Status: AC
Start: 2023-02-04 — End: 2023-02-14
  Filled 2023-02-04 (×2): qty 20, 10d supply, fill #0

## 2023-02-04 MED ORDER — DOXYCYCLINE HYCLATE 100 MG PO TABS: 100.0000 mg | ORAL_TABLET | Freq: Two times a day (BID) | ORAL | 0 refills | Status: AC

## 2023-02-04 NOTE — Progress Notes (Signed)
 E-Visit for Sinus Problems  We are sorry that you are not feeling well.  Here is how we plan to help!  Based on what you have shared with me it looks like you have sinusitis.  Sinusitis is inflammation and infection in the sinus cavities of the head.  Based on your presentation I believe you most likely have Acute Bacterial Sinusitis.  This is an infection caused by bacteria and is treated with antibiotics. I have prescribed Doxycycline 100mg  by mouth twice a day for 10 days. You may use an oral decongestant such as Mucinex D or if you have glaucoma or high blood pressure use plain Mucinex. Saline nasal spray help and can safely be used as often as needed for congestion.  If you develop worsening sinus pain, fever or notice severe headache and vision changes, or if symptoms are not better after completion of antibiotic, please schedule an appointment with a health care provider.    Sinus infections are not as easily transmitted as other respiratory infection, however we still recommend that you avoid close contact with loved ones, especially the very young and elderly.  Remember to wash your hands thoroughly throughout the day as this is the number one way to prevent the spread of infection!  Home Care: Only take medications as instructed by your medical team. Complete the entire course of an antibiotic. Do not take these medications with alcohol. A steam or ultrasonic humidifier can help congestion.  You can place a towel over your head and breathe in the steam from hot water coming from a faucet. Avoid close contacts especially the very young and the elderly. Cover your mouth when you cough or sneeze. Always remember to wash your hands.  Get Help Right Away If: You develop worsening fever or sinus pain. You develop a severe head ache or visual changes. Your symptoms persist after you have completed your treatment plan.  Make sure you Understand these instructions. Will watch your  condition. Will get help right away if you are not doing well or get worse.  Thank you for choosing an e-visit.  Your e-visit answers were reviewed by a board certified advanced clinical practitioner to complete your personal care plan. Depending upon the condition, your plan could have included both over the counter or prescription medications.  Please review your pharmacy choice. Make sure the pharmacy is open so you can pick up prescription now. If there is a problem, you may contact your provider through [employer] and have the prescription routed to another pharmacy.  Your safety is important to [place]. If you have drug allergies check your prescription carefully.   For the next 24 hours you can use MyChart to ask questions about today's visit, request a non-urgent call back, or ask for a work or school excuse. You will get an email in the next two days asking about your experience. I hope that your e-visit has been valuable and will speed your recovery.  Meds ordered this encounter  Medications   doxycycline (VIBRA-TABS) 100 MG tablet    Sig: Take 1 tablet (100 mg total) by mouth 2 (two) times daily for 10 days.    Dispense:  20 tablet    Refill:  0     I spent approximately 5 minutes reviewing the patient's history, current symptoms and coordinating their care today.

## 2023-02-11 DIAGNOSIS — F411 Generalized anxiety disorder: Secondary | ICD-10-CM | POA: Diagnosis not present

## 2023-02-18 DIAGNOSIS — F411 Generalized anxiety disorder: Secondary | ICD-10-CM | POA: Diagnosis not present

## 2023-02-26 DIAGNOSIS — F411 Generalized anxiety disorder: Secondary | ICD-10-CM | POA: Diagnosis not present

## 2023-03-03 DIAGNOSIS — F411 Generalized anxiety disorder: Secondary | ICD-10-CM | POA: Diagnosis not present

## 2023-03-06 ENCOUNTER — Telehealth: Payer: Commercial Managed Care - PPO | Admitting: Family Medicine

## 2023-03-06 DIAGNOSIS — B9689 Other specified bacterial agents as the cause of diseases classified elsewhere: Secondary | ICD-10-CM | POA: Diagnosis not present

## 2023-03-06 DIAGNOSIS — J028 Acute pharyngitis due to other specified organisms: Secondary | ICD-10-CM

## 2023-03-06 MED ORDER — AZITHROMYCIN 250 MG PO TABS: ORAL_TABLET | ORAL | 0 refills | Status: AC

## 2023-03-06 NOTE — Progress Notes (Signed)
E-Visit for Sore Throat - Strep Symptoms  We are sorry that you are not feeling well.  Here is how we plan to help!  Based on what you have shared with me it is likely that you have strep pharyngitis.  Strep pharyngitis is inflammation and infection in the back of the throat.  This is an infection cause by bacteria and is treated with antibiotics.  I have prescribed Azithromycin 250 mg two tablets today and then one daily for 4 additional days. For throat pain, we recommend over the counter oral pain relief medications such as acetaminophen or aspirin, or anti-inflammatory medications such as ibuprofen or naproxen sodium. Topical treatments such as oral throat lozenges or sprays may be used as needed. Strep infections are not as easily transmitted as other respiratory infections, however we still recommend that you avoid close contact with loved ones, especially the very young and elderly.  Remember to wash your hands thoroughly throughout the day as this is the number one way to prevent the spread of infection and wipe down door knobs and counters with disinfectant.   Home Care: Only take medications as instructed by your medical team. Complete the entire course of an antibiotic. Do not take these medications with alcohol. A steam or ultrasonic humidifier can help congestion.  You can place a towel over your head and breathe in the steam from hot water coming from a faucet. Avoid close contacts especially the very young and the elderly. Cover your mouth when you cough or sneeze. Always remember to wash your hands.  Get Help Right Away If: You develop worsening fever or sinus pain. You develop a severe head ache or visual changes. Your symptoms persist after you have completed your treatment plan.  Make sure you Understand these instructions. Will watch your condition. Will get help right away if you are not doing well or get worse.   Thank you for choosing an e-visit.  Your e-visit  answers were reviewed by a board certified advanced clinical practitioner to complete your personal care plan. Depending upon the condition, your plan could have included both over the counter or prescription medications.  Please review your pharmacy choice. Make sure the pharmacy is open so you can pick up prescription now. If there is a problem, you may contact your provider through [employer] and have the prescription routed to another pharmacy.  Your safety is important to [place]. If you have drug allergies check your prescription carefully.   For the next 24 hours you can use MyChart to ask questions about today's visit, request a non-urgent call back, or ask for a work or school excuse. You will get an email in the next two days asking about your experience. I hope that your e-visit has been valuable and will speed your recovery.  I provided 5 minutes of non face-to-face time during this encounter for chart review, medication and order placement, as well as and documentation.

## 2023-03-12 DIAGNOSIS — F411 Generalized anxiety disorder: Secondary | ICD-10-CM | POA: Diagnosis not present

## 2023-03-18 DIAGNOSIS — F411 Generalized anxiety disorder: Secondary | ICD-10-CM | POA: Diagnosis not present

## 2023-03-19 DIAGNOSIS — F411 Generalized anxiety disorder: Secondary | ICD-10-CM | POA: Diagnosis not present

## 2023-03-24 ENCOUNTER — Other Ambulatory Visit: Payer: Self-pay

## 2023-03-24 ENCOUNTER — Other Ambulatory Visit: Payer: Self-pay | Admitting: Family Medicine

## 2023-03-24 ENCOUNTER — Other Ambulatory Visit (HOSPITAL_COMMUNITY): Payer: Self-pay

## 2023-03-24 DIAGNOSIS — F411 Generalized anxiety disorder: Secondary | ICD-10-CM | POA: Diagnosis not present

## 2023-03-24 MED ORDER — FAMOTIDINE 40 MG PO TABS
40.0000 mg | ORAL_TABLET | Freq: Every day | ORAL | 1 refills | Status: AC
  Filled 2023-03-24: qty 90, 90d supply, fill #0
  Filled 2023-06-16: qty 90, 90d supply, fill #1

## 2023-03-24 MED ORDER — AMLODIPINE BESYLATE 5 MG PO TABS
5.0000 mg | ORAL_TABLET | Freq: Every day | ORAL | 1 refills | Status: DC
  Filled 2023-03-24: qty 90, 90d supply, fill #0
  Filled 2023-06-16: qty 90, 90d supply, fill #1

## 2023-03-25 ENCOUNTER — Other Ambulatory Visit: Payer: Self-pay

## 2023-03-25 ENCOUNTER — Other Ambulatory Visit (HOSPITAL_COMMUNITY): Payer: Self-pay

## 2023-03-26 DIAGNOSIS — F411 Generalized anxiety disorder: Secondary | ICD-10-CM | POA: Diagnosis not present

## 2023-03-31 DIAGNOSIS — F411 Generalized anxiety disorder: Secondary | ICD-10-CM | POA: Diagnosis not present

## 2023-04-08 ENCOUNTER — Telehealth: Payer: Self-pay | Admitting: Family Medicine

## 2023-04-08 ENCOUNTER — Encounter: Payer: Self-pay | Admitting: Emergency Medicine

## 2023-04-08 NOTE — Telephone Encounter (Signed)
 Patient has a follow up appointment 09/11/23. Would you like to make it his physical?

## 2023-04-08 NOTE — Telephone Encounter (Signed)
I sure will Thank you!  

## 2023-04-08 NOTE — Telephone Encounter (Signed)
 Copied from CRM [id number]. Topic: Appointments - Scheduling Inquiry for Clinic >> Apr 08, 2023 11:06 AM Nicholas Robertson wrote: Reason for CRM: Patient is wanting to schedule his appointment in July for his physical - no availability until January 2026, Patient wants to confirm if there is anyway he can be seen sooner.

## 2023-04-24 ENCOUNTER — Other Ambulatory Visit: Payer: Self-pay | Admitting: Family Medicine

## 2023-04-24 ENCOUNTER — Other Ambulatory Visit (HOSPITAL_COMMUNITY): Payer: Self-pay

## 2023-04-24 ENCOUNTER — Other Ambulatory Visit: Payer: Self-pay

## 2023-04-24 MED ORDER — TELMISARTAN-HCTZ 80-25 MG PO TABS
1.0000 | ORAL_TABLET | Freq: Every day | ORAL | 1 refills | Status: DC
  Filled 2023-04-24: qty 90, 90d supply, fill #0
  Filled 2023-06-16 – 2023-08-30 (×2): qty 90, 90d supply, fill #1

## 2023-04-24 MED ORDER — ROSUVASTATIN CALCIUM 5 MG PO TABS
5.0000 mg | ORAL_TABLET | Freq: Every day | ORAL | 1 refills | Status: DC
  Filled 2023-04-24: qty 90, 90d supply, fill #0
  Filled 2023-06-16 – 2023-08-30 (×2): qty 90, 90d supply, fill #1

## 2023-04-28 ENCOUNTER — Encounter: Payer: Self-pay | Admitting: Urology

## 2023-04-28 ENCOUNTER — Ambulatory Visit: Admitting: Urology

## 2023-04-28 ENCOUNTER — Ambulatory Visit (HOSPITAL_BASED_OUTPATIENT_CLINIC_OR_DEPARTMENT_OTHER)
Admission: RE | Admit: 2023-04-28 | Discharge: 2023-04-28 | Disposition: A | Discharge status: Home / Self Care | Source: Ambulatory Visit | Attending: Urology | Admitting: Urology

## 2023-04-28 VITALS — BP 133/88 | HR 87 | Ht 70.0 in | Wt 250.0 lb

## 2023-04-28 DIAGNOSIS — N200 Calculus of kidney: Secondary | ICD-10-CM

## 2023-04-28 LAB — URINALYSIS, ROUTINE W REFLEX MICROSCOPIC
Bilirubin, UA: NEGATIVE
Glucose, UA: NEGATIVE
Ketones, UA: NEGATIVE
Leukocytes,UA: NEGATIVE
Nitrite, UA: NEGATIVE
Protein,UA: NEGATIVE
Specific Gravity, UA: 1.02 (ref 1.005–1.030)
Urobilinogen, Ur: 0.2 mg/dL (ref 0.2–1.0)
pH, UA: 7 (ref 5.0–7.5)

## 2023-04-28 LAB — MICROSCOPIC EXAMINATION

## 2023-04-28 NOTE — Progress Notes (Signed)
 Assessment: 1. Nephrolithiasis, left     Plan: I discussed management of the nonobstructing left renal calculus with observation versus shockwave lithotripsy.   Continue stone prevention. KUB today-will contact him with results.  Chief Complaint:  Chief Complaint  Patient presents with   Nephrolithiasis    History of Present Illness:  Nicholas Robertson is a 38 y.o. male who is seen for evaluation of nephrolithiasis.  He presented to the emergency room on 09/29/2022 with acute onset of left-sided flank pain.  Urinalysis showed >50 RBCs.  CT imaging showed a 6-7 mm stone in the lower pole of the left kidney without obstruction and no other renal or ureteral calculi.  His pain improved while in the ER.  He had not had any further flank pain.  He was not aware of passing a stone.  No dysuria or gross hematuria.  He has a prior history of nephrolithiasis undergoing lithotripsy x 2.  His stones are calcium oxalate by history.  His last stone episode was in 2019.  He returns today for follow-up.  He reports occasional symptoms of left-sided flank and left abdominal pain.  His last episode was 2 weeks ago.  These have been fairly short in duration.  No dysuria or gross hematuria.  Past Medical History:  Past Medical History:  Diagnosis Date   Abnormal LFTs     Anxiety and depression 04/18/2013   Cardiac murmur 12/01/2020   Chest pain 12/01/2020   Chest tightness 10/30/2020   Essential hypertension 02/07/2013   Family history of adverse reaction to anesthesia    father is hard to wake after anesthesia   Gastroesophageal reflux disease 04/20/2020   Hyperlipidemia 02/07/2013   Ingrown toenail of both feet 07/05/2018   Kidney stones    Neck pain 08/02/2019   Obesity (BMI 30-39.9) 08/02/2019   Obesity (BMI 35.0-39.9 without comorbidity) 12/01/2020   Preventative health care 04/18/2013   Seborrhea 03/08/2019   Vitamin B12 deficiency 09/12/2016   Vitamin D deficiency 03/08/2019    Wolff-Parkinson-White (WPW) syndrome 02/17/2013   S/p ablation.    Past Surgical History:  Past Surgical History:  Procedure Laterality Date   ABLATION  03/09/2013   EPS and RFCA of manifest right posteroloateral accessory pathway   EXTRACORPOREAL SHOCK WAVE LITHOTRIPSY Left 11/08/2019   Procedure: EXTRACORPOREAL SHOCK WAVE LITHOTRIPSY (ESWL);  Surgeon: Sebastian Ache, MD;  Location: [facility];  Service: Urology;  Laterality: Left;   FLEXIBLE SIGMOIDOSCOPY  2013   [facility] [place]   SIGMOIDOSCOPY     SUPRAVENTRICULAR TACHYCARDIA ABLATION N/A 03/08/2013   Procedure: WPW ABLATION;  Surgeon: Marinus Maw, MD;  Location[facility] CATH LAB;  Service: Cardiovascular;  Laterality: N/A;    Allergies:  Allergies  Allergen Reactions   Wasp Venom Hives    Family History:  Family History  Problem Relation Age of Onset   Hypertension Mother    Hyperlipidemia Mother    Mental illness Mother        anxiety   Other Mother        glucose intolerant   Colon polyps Mother    Diabetes Father        2   Hypertension Father    Arthritis Father    Colon polyps Father    Psoriasis Maternal Grandmother    Hypertension Maternal Grandmother    Cancer Maternal Grandmother        lymphoma   Hyperlipidemia Maternal Grandfather    Heart disease Maternal Grandfather  cabg   Hypertension Maternal Grandfather    Polymyalgia rheumatica Maternal Grandfather    Hypertension Paternal Grandmother    Diabetes Paternal Grandmother        2   COPD Paternal Grandmother    Heart disease Paternal Grandfather        s/p MI, s/p CABG   Hyperlipidemia Paternal Grandfather    Hypertension Paternal Grandfather    Diabetes Paternal Grandfather        1   Colon cancer Neg Hx    Esophageal cancer Neg Hx    Rectal cancer Neg Hx    Stomach cancer Neg Hx     Social History:  Social History   Tobacco Use   Smoking status: Never   Smokeless tobacco: Never   Vaping Use   Vaping status: Never Used  Substance Use Topics   Alcohol use: No   Drug use: No    ROS: Constitutional:  Negative for fever, chills, weight loss CV: Negative for chest pain, previous MI, hypertension Respiratory:  Negative for shortness of breath, wheezing, sleep apnea, frequent cough GI:  Negative for nausea, vomiting, bloody stool, GERD  Physical exam: BP 133/88   Pulse 87   Ht 5\' 10"  (1.778 m)   Wt 250 lb (113.4 kg)   BMI 35.87 kg/m  GENERAL APPEARANCE:  Well appearing, well developed, well nourished, NAD HEENT:  Atraumatic, normocephalic, oropharynx clear NECK:  Supple without lymphadenopathy or thyromegaly ABDOMEN:  Soft, non-tender, no masses EXTREMITIES:  Moves all extremities well, without clubbing, cyanosis, or edema NEUROLOGIC:  Alert and oriented x 3, normal gait, CN II-XII grossly intact MENTAL STATUS:  appropriate BACK:  Non-tender to palpation, No CVAT SKIN:  Warm, dry, and intact  Results: U/A: 0-5 WBC, 0-2 RBC

## 2023-04-29 ENCOUNTER — Encounter: Payer: Self-pay | Admitting: Urology

## 2023-05-29 ENCOUNTER — Ambulatory Visit: Admitting: Family Medicine

## 2023-05-29 ENCOUNTER — Encounter: Payer: Self-pay | Admitting: Family Medicine

## 2023-05-29 ENCOUNTER — Other Ambulatory Visit (HOSPITAL_COMMUNITY): Payer: Self-pay

## 2023-05-29 ENCOUNTER — Other Ambulatory Visit: Payer: Self-pay

## 2023-05-29 VITALS — BP 150/98 | HR 76 | Resp 16 | Ht 70.0 in | Wt 259.6 lb

## 2023-05-29 DIAGNOSIS — E782 Mixed hyperlipidemia: Secondary | ICD-10-CM | POA: Diagnosis not present

## 2023-05-29 DIAGNOSIS — I456 Pre-excitation syndrome: Secondary | ICD-10-CM | POA: Diagnosis not present

## 2023-05-29 DIAGNOSIS — E559 Vitamin D deficiency, unspecified: Secondary | ICD-10-CM

## 2023-05-29 DIAGNOSIS — Z6837 Body mass index (BMI) 37.0-37.9, adult: Secondary | ICD-10-CM

## 2023-05-29 DIAGNOSIS — E785 Hyperlipidemia, unspecified: Secondary | ICD-10-CM | POA: Diagnosis not present

## 2023-05-29 DIAGNOSIS — R739 Hyperglycemia, unspecified: Secondary | ICD-10-CM | POA: Diagnosis not present

## 2023-05-29 DIAGNOSIS — E1169 Type 2 diabetes mellitus with other specified complication: Secondary | ICD-10-CM

## 2023-05-29 DIAGNOSIS — Z7985 Long-term (current) use of injectable non-insulin antidiabetic drugs: Secondary | ICD-10-CM

## 2023-05-29 DIAGNOSIS — E669 Obesity, unspecified: Secondary | ICD-10-CM | POA: Diagnosis not present

## 2023-05-29 DIAGNOSIS — I10 Essential (primary) hypertension: Secondary | ICD-10-CM | POA: Diagnosis not present

## 2023-05-29 DIAGNOSIS — E538 Deficiency of other specified B group vitamins: Secondary | ICD-10-CM | POA: Diagnosis not present

## 2023-05-29 LAB — COMPREHENSIVE METABOLIC PANEL WITH GFR
ALT: 75 U/L — ABNORMAL HIGH (ref 0–53)
AST: 38 U/L — ABNORMAL HIGH (ref 0–37)
Albumin: 4.6 g/dL (ref 3.5–5.2)
Alkaline Phosphatase: 61 U/L (ref 39–117)
BUN: 10 mg/dL (ref 6–23)
CO2: 28 meq/L (ref 19–32)
Calcium: 9.7 mg/dL (ref 8.4–10.5)
Chloride: 102 meq/L (ref 96–112)
Creatinine, Ser: 0.8 mg/dL (ref 0.40–1.50)
GFR: 112.89 mL/min (ref >60.00)
Glucose, Bld: 103 mg/dL — ABNORMAL HIGH (ref 70–99)
Potassium: 3.7 meq/L (ref 3.5–5.1)
Sodium: 142 meq/L (ref 135–145)
Total Bilirubin: 0.9 mg/dL (ref 0.2–1.2)
Total Protein: 7.2 g/dL (ref 6.0–8.3)

## 2023-05-29 LAB — CBC WITH DIFFERENTIAL/PLATELET
Basophils Absolute: 0 10*3/uL (ref 0.0–0.1)
Basophils Relative: 0.6 % (ref 0.0–3.0)
Eosinophils Absolute: 0.1 10*3/uL (ref 0.0–0.7)
Eosinophils Relative: 0.9 % (ref 0.0–5.0)
HCT: 46.5 % (ref 39.0–52.0)
Hemoglobin: 15.9 g/dL (ref 13.0–17.0)
Lymphocytes Relative: 40.7 % (ref 12.0–46.0)
Lymphs Abs: 2.7 10*3/uL (ref 0.7–4.0)
MCHC: 34.3 g/dL (ref 30.0–36.0)
MCV: 90.7 fl (ref 78.0–100.0)
Monocytes Absolute: 0.6 10*3/uL (ref 0.1–1.0)
Monocytes Relative: 9.4 % (ref 3.0–12.0)
Neutro Abs: 3.2 10*3/uL (ref 1.4–7.7)
Neutrophils Relative %: 48.4 % (ref 43.0–77.0)
Platelets: 231 10*3/uL (ref 150.0–400.0)
RBC: 5.12 Mil/uL (ref 4.22–5.81)
RDW: 13.1 % (ref 11.5–15.5)
WBC: 6.6 10*3/uL (ref 4.0–10.5)

## 2023-05-29 LAB — LIPID PANEL
Cholesterol: 188 mg/dL (ref 0–200)
HDL: 51.4 mg/dL (ref >39.00)
LDL Cholesterol: 116 mg/dL — ABNORMAL HIGH (ref 0–99)
NonHDL: 136.14
Total CHOL/HDL Ratio: 4
Triglycerides: 101 mg/dL (ref 0.0–149.0)
VLDL: 20.2 mg/dL (ref 0.0–40.0)

## 2023-05-29 LAB — VITAMIN B12: Vitamin B-12: 235 pg/mL (ref 211–911)

## 2023-05-29 LAB — TSH: TSH: 1.83 u[IU]/mL (ref 0.35–5.50)

## 2023-05-29 LAB — HEMOGLOBIN A1C: Hgb A1c MFr Bld: 6.5 % (ref 4.6–6.5)

## 2023-05-29 LAB — VITAMIN D 25 HYDROXY (VIT D DEFICIENCY, FRACTURES): VITD: 19.19 ng/mL — ABNORMAL LOW (ref 30.00–100.00)

## 2023-05-29 MED ORDER — METHYLPHENIDATE HCL 10 MG PO TABS
10.0000 mg | ORAL_TABLET | Freq: Two times a day (BID) | ORAL | 0 refills | Status: AC
  Filled 2023-05-29 – 2023-08-30 (×2): qty 60, 30d supply, fill #0

## 2023-05-29 MED ORDER — METOPROLOL SUCCINATE ER 50 MG PO TB24
50.0000 mg | ORAL_TABLET | Freq: Every day | ORAL | 3 refills | Status: AC
  Filled 2023-05-29: qty 90, 90d supply, fill #0
  Filled 2023-08-30: qty 90, 90d supply, fill #1
  Filled 2023-11-06 – 2023-11-10 (×2): qty 90, 90d supply, fill #2
  Filled 2024-01-29: qty 90, 90d supply, fill #3

## 2023-05-29 MED ORDER — DOXAZOSIN MESYLATE 1 MG PO TABS
1.0000 mg | ORAL_TABLET | Freq: Every day | ORAL | 1 refills | Status: DC
  Filled 2023-05-29: qty 90, 90d supply, fill #0
  Filled 2023-08-30: qty 90, 90d supply, fill #1

## 2023-05-29 MED ORDER — METHYLPHENIDATE HCL 10 MG PO TABS
10.0000 mg | ORAL_TABLET | Freq: Two times a day (BID) | ORAL | 0 refills | Status: AC
  Filled 2023-05-29: qty 60, 30d supply, fill #0

## 2023-05-29 NOTE — Assessment & Plan Note (Signed)
 Supplement and monitor

## 2023-05-29 NOTE — Assessment & Plan Note (Signed)
 [id number] acceptable, minimize simple carbs. Increase exercise as tolerated.

## 2023-05-29 NOTE — Assessment & Plan Note (Signed)
 Encouraged DASH or MIND diet, decrease po intake and increase exercise as tolerated. Needs 7-8 hours of sleep nightly. Avoid trans fats, eat small, frequent meals every 4-5 hours with lean proteins, complex carbs and healthy fats. Minimize simple carbs, high fat foods and processed foods

## 2023-05-29 NOTE — Assessment & Plan Note (Signed)
Did not tolerate Atorvastatin. Tolerating Rosuvastatin. Encourage heart healthy diet such as MIND or DASH diet, increase exercise, avoid trans fats, simple carbohydrates and processed foods, consider a krill or fish or flaxseed oil cap daily.

## 2023-05-29 NOTE — Patient Instructions (Signed)
 Hypertension, Adult High blood pressure (hypertension) is when the force of blood pumping through the arteries is too strong. The arteries are the blood vessels that carry blood from the heart throughout the body. Hypertension forces the heart to work harder to pump blood and may cause arteries to become narrow or stiff. Untreated or uncontrolled hypertension can lead to a heart attack, heart failure, a stroke, kidney disease, and other problems. A blood pressure reading consists of a higher number over a lower number. Ideally, your blood pressure should be below 120/80. The first ("top") number is called the systolic pressure. It is a measure of the pressure in your arteries as your heart beats. The second ("bottom") number is called the diastolic pressure. It is a measure of the pressure in your arteries as the heart relaxes. What are the causes? The exact cause of this condition is not known. There are some conditions that result in high blood pressure. What increases the risk? Certain factors may make you more likely to develop high blood pressure. Some of these risk factors are under your control, including: Smoking. Not getting enough exercise or physical activity. Being overweight. Having too much fat, sugar, calories, or salt (sodium) in your diet. Drinking too much alcohol. Other risk factors include: Having a personal history of heart disease, diabetes, high cholesterol, or kidney disease. Stress. Having a family history of high blood pressure and high cholesterol. Having obstructive sleep apnea. Age. The risk increases with age. What are the signs or symptoms? High blood pressure may not cause symptoms. Very high blood pressure (hypertensive crisis) may cause: Headache. Fast or irregular heartbeats (palpitations). Shortness of breath. Nosebleed. Nausea and vomiting. Vision changes. Severe chest pain, dizziness, and seizures. How is this diagnosed? This condition is diagnosed by  measuring your blood pressure while you are seated, with your arm resting on a flat surface, your legs uncrossed, and your feet flat on the floor. The cuff of the blood pressure monitor will be placed directly against the skin of your upper arm at the level of your heart. Blood pressure should be measured at least twice using the same arm. Certain conditions can cause a difference in blood pressure between your right and left arms. If you have a high blood pressure reading during one visit or you have normal blood pressure with other risk factors, you may be asked to: Return on a different day to have your blood pressure checked again. Monitor your blood pressure at home for 1 week or longer. If you are diagnosed with hypertension, you may have other blood or imaging tests to help your health care provider understand your overall risk for other conditions. How is this treated? This condition is treated by making healthy lifestyle changes, such as eating healthy foods, exercising more, and reducing your alcohol intake. You may be referred for counseling on a healthy diet and physical activity. Your health care provider may prescribe medicine if lifestyle changes are not enough to get your blood pressure under control and if: Your systolic blood pressure is above 130. Your diastolic blood pressure is above 80. Your personal target blood pressure may vary depending on your medical conditions, your age, and other factors. Follow these instructions at home: Eating and drinking  Eat a diet that is high in fiber and potassium, and low in sodium, added sugar, and fat. An example of this eating plan is called the DASH diet. DASH stands for Dietary Approaches to Stop Hypertension. To eat this way: Eat  plenty of fresh fruits and vegetables. Try to fill one half of your plate at each meal with fruits and vegetables. Eat whole grains, such as whole-wheat pasta, brown rice, or whole-grain bread. Fill about one  fourth of your plate with whole grains. Eat or drink low-fat dairy products, such as skim milk or low-fat yogurt. Avoid fatty cuts of meat, processed or cured meats, and poultry with skin. Fill about one fourth of your plate with lean proteins, such as fish, chicken without skin, beans, eggs, or tofu. Avoid pre-made and processed foods. These tend to be higher in sodium, added sugar, and fat. Reduce your daily sodium intake. Many people with hypertension should eat less than 1,500 mg of sodium a day. Do not drink alcohol if: Your health care provider tells you not to drink. You are pregnant, may be pregnant, or are planning to become pregnant. If you drink alcohol: Limit how much you have to: 0-1 drink a day for women. 0-2 drinks a day for men. Know how much alcohol is in your drink. In the [place] one drink equals one 12 oz bottle of beer (355 mL), one 5 oz glass of wine (148 mL), or one 1 oz glass of hard liquor (44 mL). Lifestyle  Work with your health care provider to maintain a healthy body weight or to lose weight. Ask what an ideal weight is for you. Get at least 30 minutes of exercise that causes your heart to beat faster (aerobic exercise) most days of the week. Activities may include walking, swimming, or biking. Include exercise to strengthen your muscles (resistance exercise), such as Pilates or lifting weights, as part of your weekly exercise routine. Try to do these types of exercises for 30 minutes at least 3 days a week. Do not use any products that contain nicotine or tobacco. These products include cigarettes, chewing tobacco, and vaping devices, such as e-cigarettes. If you need help quitting, ask your health care provider. Monitor your blood pressure at home as told by your health care provider. Keep all follow-up visits. This is important. Medicines Take over-the-counter and prescription medicines only as told by your health care provider. Follow directions carefully. Blood  pressure medicines must be taken as prescribed. Do not skip doses of blood pressure medicine. Doing this puts you at risk for problems and can make the medicine less effective. Ask your health care provider about side effects or reactions to medicines that you should watch for. Contact a health care provider if you: Think you are having a reaction to a medicine you are taking. Have headaches that keep coming back (recurring). Feel dizzy. Have swelling in your ankles. Have trouble with your vision. Get help right away if you: Develop a severe headache or confusion. Have unusual weakness or numbness. Feel faint. Have severe pain in your chest or abdomen. Vomit repeatedly. Have trouble breathing. These symptoms may be an emergency. Get help right away. Call 911. Do not wait to see if the symptoms will go away. Do not drive yourself to the hospital. Summary Hypertension is when the force of blood pumping through your arteries is too strong. If this condition is not controlled, it may put you at risk for serious complications. Your personal target blood pressure may vary depending on your medical conditions, your age, and other factors. For most people, a normal blood pressure is less than 120/80. Hypertension is treated with lifestyle changes, medicines, or a combination of both. Lifestyle changes include losing weight, eating a healthy,  low-sodium diet, exercising more, and limiting alcohol. This information is not intended to replace advice given to you by your health care provider. Make sure you discuss any questions you have with your health care provider. Document Revised: 11/21/2020 Document Reviewed: 11/21/2020 Elsevier Patient Education  2024 [employer].

## 2023-05-29 NOTE — Assessment & Plan Note (Addendum)
 Not well controlled, but has already been seeing patients this am, no changes to meds. Encouraged heart healthy diet such as the DASH diet and exercise as tolerated. Will add Doxazosin  and reevaluate

## 2023-06-01 ENCOUNTER — Other Ambulatory Visit: Payer: Self-pay | Admitting: Family Medicine

## 2023-06-01 DIAGNOSIS — F419 Anxiety disorder, unspecified: Secondary | ICD-10-CM

## 2023-06-01 MED ORDER — TIRZEPATIDE 2.5 MG/0.5ML ~~LOC~~ SOAJ
2.5000 mg | SUBCUTANEOUS | 1 refills | Status: AC
Start: 2023-06-01 — End: ?
  Filled 2023-06-01: qty 2, 28d supply, fill #0
  Filled 2023-08-30: qty 2, 28d supply, fill #1

## 2023-06-01 NOTE — Progress Notes (Signed)
 Subjective:    Patient ID: Nicholas Robertson, male    DOB: [birth year], 38 y.o.   MRN: [id number]  Chief Complaint  Patient presents with   Medical Management of Chronic Issues    Patient presents today for a 6 month follow-up   Quality Metric Gaps    Pneumococcal    HPI Patient is in today for follow up on chronic medical concerns. No recent febrile illness or acute hospitalizations. He is working long hours from home and under a great deal of stress. He notes his systolic bp has been running 130s to 150s most of the time. He notes the low dose Ritalin  is helping him with work completion and does not have any side effects. Notes his blood pressure is about the same whether he takes his Ritalin  and he does not. He is trying to eat well and stay active. His daughter is doing well. No complaints of CP/palp/SOB/HA/congestion/fevers/GI or GU c/o. Taking meds as prescribed   Past Medical History:  Diagnosis Date   Abnormal LFTs     Anxiety and depression 04/18/2013   Cardiac murmur 12/01/2020   Chest pain 12/01/2020   Chest tightness 10/30/2020   Essential hypertension 02/07/2013   Family history of adverse reaction to anesthesia    father is hard to wake after anesthesia   Gastroesophageal reflux disease 04/20/2020   Hyperlipidemia 02/07/2013   Ingrown toenail of both feet 07/05/2018   Kidney stones    Neck pain 08/02/2019   Obesity (BMI 30-39.9) 08/02/2019   Obesity (BMI 35.0-39.9 without comorbidity) 12/01/2020   Preventative health care 04/18/2013   Seborrhea 03/08/2019   Vitamin B12 deficiency 09/12/2016   Vitamin D  deficiency 03/08/2019   Wolff-Parkinson-White (WPW) syndrome 02/17/2013   S/p ablation.    Past Surgical History:  Procedure Laterality Date   ABLATION  03/09/2013   EPS and RFCA of manifest right posteroloateral accessory pathway   EXTRACORPOREAL SHOCK WAVE LITHOTRIPSY Left 11/08/2019   Procedure: EXTRACORPOREAL SHOCK WAVE LITHOTRIPSY (ESWL);  Surgeon: Osborn Blaze, MD;  Location: [facility];  Service: Urology;  Laterality: Left;   FLEXIBLE SIGMOIDOSCOPY  2013   [facility] [place]   SIGMOIDOSCOPY     SUPRAVENTRICULAR TACHYCARDIA ABLATION N/A 03/08/2013   Procedure: WPW ABLATION;  Surgeon: Tammie Fall, MD;  Location: [facility] CATH [facility];  Service: Cardiovascular;  Laterality: N/A;    Family History  Problem Relation Age of Onset   Hypertension Mother    Hyperlipidemia Mother    Mental illness Mother        anxiety   Other Mother        glucose intolerant   Colon polyps Mother    Diabetes Father        2   Hypertension Father    Arthritis Father    Colon polyps Father    Psoriasis Maternal Grandmother    Hypertension Maternal Grandmother    Cancer Maternal Grandmother        lymphoma   Hyperlipidemia Maternal Grandfather    Heart disease Maternal Grandfather        cabg   Hypertension Maternal Grandfather    Polymyalgia rheumatica Maternal Grandfather    Hypertension Paternal Grandmother    Diabetes Paternal Grandmother        2   COPD Paternal Grandmother    Heart disease Paternal Grandfather        s/p MI, s/p CABG   Hyperlipidemia Paternal Grandfather    Hypertension Paternal  Grandfather    Diabetes Paternal Grandfather        1   Colon cancer Neg Hx    Esophageal cancer Neg Hx    Rectal cancer Neg Hx    Stomach cancer Neg Hx     Social History   Socioeconomic History   Marital status: Married    Spouse name: Not on file   Number of children: Not on file   Years of education: Not on file   Highest education level: Master's degree (e.g., MA, MS, MEng, MEd, MSW, MBA)  Occupational History   Not on file  Tobacco Use   Smoking status: Never   Smokeless tobacco: Never  Vaping Use   Vaping status: Never Used  Substance and Sexual Activity   Alcohol use: No   Drug use: No   Sexual activity: Yes    Comment: no dietary restricitons, lives alone with cat, seat belts  Other  Topics Concern   Not on file  Social History Narrative   Works at [employer], as PA   Lives with partner   No major dietary restrictions.    Pet: cat, dog   Social Drivers of Corporate investment banker Strain: Low Risk  (05/27/2023)   Overall Financial Resource Strain (CARDIA)    Difficulty of Paying Living Expenses: Not hard at all  Food Insecurity: No Food Insecurity (05/27/2023)   Hunger Vital Sign    Worried About Running Out of Food in the Last Year: Never true    Ran Out of Food in the Last Year: Never true  Transportation Needs: No Transportation Needs (05/27/2023)   PRAPARE - Administrator, Civil Service (Medical): No    Lack of Transportation (Non-Medical): No  Physical Activity: Insufficiently Active (05/27/2023)   Exercise Vital Sign    Days of Exercise per Week: 2 days    Minutes of Exercise per Session: 20 min  Stress: Stress Concern Present (05/27/2023)   [employer] of Occupational Health - Occupational Stress Questionnaire    Feeling of Stress : To some extent  Social Connections: Moderately Isolated (05/27/2023)   Social Connection and Isolation Panel [NHANES]    Frequency of Communication with Friends and Family: More than three times a week    Frequency of Social Gatherings with Friends and Family: Once a week    Attends Religious Services: Never    Database administrator or Organizations: No    Attends Engineer, structural: Not on file    Marital Status: Married  Catering manager Violence: Not on file    Outpatient Medications Prior to Visit  Medication Sig Dispense Refill   amLODipine  (NORVASC ) 5 MG tablet Take 1 tablet (5 mg total) by mouth daily. 90 tablet 1   Cholecalciferol (VITAMIN D ) 125 MCG (5000 UT) CAPS Take 1 capsule by mouth daily at 12 noon.     cyanocobalamin  1000 MCG tablet Take 1,000 mcg by mouth daily.     esomeprazole  (NEXIUM ) 20 MG capsule Take 1 capsule (20 mg total) by mouth daily at 12 noon. 90 capsule 1    famotidine  (PEPCID ) 40 MG tablet Take 1 tablet (40 mg total) by mouth daily. 90 tablet 1   rosuvastatin  (CRESTOR ) 5 MG tablet Take 1 tablet (5 mg total) by mouth daily. 90 tablet 1   telmisartan -hydrochlorothiazide  (MICARDIS  HCT) 80-25 MG tablet Take 1 tablet by mouth daily. 90 tablet 1   vortioxetine  HBr (TRINTELLIX ) 20 MG TABS tablet Take 1 tablet (20  mg total) by mouth daily. 90 tablet 1   methylphenidate  (RITALIN ) 10 MG tablet Take 1 tablet (10 mg total) by mouth 2 (two) times daily. 60 tablet 0   metoprolol  succinate (TOPROL -XL) 50 MG 24 hr tablet Take 1 tablet (50 mg total) by mouth daily with or immediately following a meal. 90 tablet 3   No facility-administered medications prior to visit.    Allergies  Allergen Reactions   Wasp Venom Hives    Review of Systems  Constitutional:  Positive for malaise/fatigue. Negative for fever.  HENT:  Negative for congestion.   Eyes:  Negative for blurred vision.  Respiratory:  Negative for shortness of breath.   Cardiovascular:  Negative for chest pain, palpitations and leg swelling.  Gastrointestinal:  Negative for abdominal pain, blood in stool and nausea.  Genitourinary:  Negative for dysuria and frequency.  Musculoskeletal:  Negative for falls.  Skin:  Negative for rash.  Neurological:  Negative for dizziness, loss of consciousness and headaches.  Endo/Heme/Allergies:  Negative for environmental allergies.  Psychiatric/Behavioral:  Negative for depression. The patient is nervous/anxious.        Objective:    Physical Exam Vitals reviewed.  Constitutional:      Appearance: Normal appearance. He is not ill-appearing.  HENT:     Head: Normocephalic and atraumatic.     Nose: Nose normal.  Eyes:     Conjunctiva/sclera: Conjunctivae normal.  Cardiovascular:     Rate and Rhythm: Normal rate.     Pulses: Normal pulses.     Heart sounds: Normal heart sounds. No murmur heard. Pulmonary:     Effort: Pulmonary effort is normal.      Breath sounds: Normal breath sounds. No wheezing.  Abdominal:     Palpations: Abdomen is soft. There is no mass.     Tenderness: There is no abdominal tenderness.  Musculoskeletal:     Cervical back: Normal range of motion.     Right lower leg: No edema.     Left lower leg: No edema.  Skin:    General: Skin is warm and dry.  Neurological:     General: No focal deficit present.     Mental Status: He is alert and oriented to person, place, and time.  Psychiatric:        Mood and Affect: Mood normal.     BP (!) 150/98   Pulse 76   Resp 16   Ht 5\' 10"  (1.778 m)   Wt 259 lb 9.6 oz (117.8 kg)   SpO2 98%   BMI 37.25 kg/m  Wt Readings from Last 3 Encounters:  05/29/23 259 lb 9.6 oz (117.8 kg)  04/28/23 250 lb (113.4 kg)  10/07/22 250 lb (113.4 kg)    Diabetic Foot Exam - Simple   No data filed    Lab Results  Component Value Date   WBC 6.6 05/29/2023   HGB 15.9 05/29/2023   HCT 46.5 05/29/2023   PLT 231.0 05/29/2023   GLUCOSE 103 (H) 05/29/2023   CHOL 188 05/29/2023   TRIG 101.0 05/29/2023   HDL 51.40 05/29/2023   LDLDIRECT 168.0 08/02/2014   LDLCALC 116 (H) 05/29/2023   ALT 75 (H) 05/29/2023   AST 38 (H) 05/29/2023   NA 142 05/29/2023   K 3.7 05/29/2023   CL 102 05/29/2023   CREATININE 0.80 05/29/2023   BUN 10 05/29/2023   CO2 28 05/29/2023   TSH 1.83 05/29/2023   HGBA1C 6.5 05/29/2023    Lab Results  Component  Value Date   TSH 1.83 05/29/2023   Lab Results  Component Value Date   WBC 6.6 05/29/2023   HGB 15.9 05/29/2023   HCT 46.5 05/29/2023   MCV 90.7 05/29/2023   PLT 231.0 05/29/2023   Lab Results  Component Value Date   NA 142 05/29/2023   K 3.7 05/29/2023   CO2 28 05/29/2023   GLUCOSE 103 (H) 05/29/2023   BUN 10 05/29/2023   CREATININE 0.80 05/29/2023   BILITOT 0.9 05/29/2023   ALKPHOS 61 05/29/2023   AST 38 (H) 05/29/2023   ALT 75 (H) 05/29/2023   PROT 7.2 05/29/2023   ALBUMIN 4.6 05/29/2023   CALCIUM  9.7 05/29/2023   ANIONGAP 10  03/10/2017   EGFR 115 12/01/2020   GFR 112.89 05/29/2023   Lab Results  Component Value Date   CHOL 188 05/29/2023   Lab Results  Component Value Date   HDL 51.40 05/29/2023   Lab Results  Component Value Date   LDLCALC 116 (H) 05/29/2023   Lab Results  Component Value Date   TRIG 101.0 05/29/2023   Lab Results  Component Value Date   CHOLHDL 4 05/29/2023   Lab Results  Component Value Date   HGBA1C 6.5 05/29/2023       Assessment & Plan:  Hyperglycemia Assessment & Plan: hgba1c acceptable but has hit 6.5, minimize simple carbs. Increase exercise as tolerated, start Mounjaro  2.5 mg weekly  Orders: -     Comprehensive metabolic panel with GFR -     CBC with Differential/Platelet -     Hemoglobin A1c  Mixed hyperlipidemia Assessment & Plan: Did not tolerate Atorvastatin . Tolerating Rosuvastatin . Encourage heart healthy diet such as MIND or DASH diet, increase exercise, avoid trans fats, simple carbohydrates and processed foods, consider a krill or fish or flaxseed oil cap daily.   Orders: -     Lipid panel -     TSH  Vitamin D  deficiency Assessment & Plan: Supplement and monitor   Orders: -     VITAMIN D  25 Hydroxy (Vit-D Deficiency, Fractures)  Essential hypertension Assessment & Plan: Not well controlled, but has already been seeing patients this am, no changes to meds. Encouraged heart healthy diet such as the DASH diet and exercise as tolerated. Will add Doxazosin  and reevaluate  Orders: -     Comprehensive metabolic panel with GFR -     CBC with Differential/Platelet -     Metoprolol  Succinate ER; Take 1 tablet (50 mg total) by mouth daily with or immediately following a meal.  Dispense: 90 tablet; Refill: 3  Vitamin B12 deficiency Assessment & Plan: Supplement and monitor   Orders: -     Vitamin B12  Obesity (BMI 30-39.9) Assessment & Plan: Encouraged DASH or MIND diet, decrease po intake and increase exercise as tolerated. Needs 7-8 hours  of sleep nightly. Avoid trans fats, eat small, frequent meals every 4-5 hours with lean proteins, complex carbs and healthy fats. Minimize simple carbs, high fat foods and processed foods    Wolff-Parkinson-White (WPW) syndrome -     Metoprolol  Succinate ER; Take 1 tablet (50 mg total) by mouth daily with or immediately following a meal.  Dispense: 90 tablet; Refill: 3  Hyperlipidemia associated with type 2 diabetes mellitus (HCC) Assessment & Plan: hgba1c acceptable but has hit 6.5, minimize simple carbs. Increase exercise as tolerated, start Mounjaro  2.5 mg weekly   Other orders -     Methylphenidate  HCl; Take 1 tablet (10 mg total) by mouth 2 (  two) times daily. June 2025  Dispense: 60 tablet; Refill: 0 -     Methylphenidate  HCl; Take 1 tablet (10 mg total) by mouth 2 (two) times daily. May 2025  Dispense: 60 tablet; Refill: 0 -     Doxazosin  Mesylate; Take 1 tablet (1 mg total) by mouth daily.  Dispense: 90 tablet; Refill: 1 -     Tirzepatide ; Inject 2.5 mg into the skin once a week.  Dispense: 2 mL; Refill: 1    Randie Bustle, MD

## 2023-06-02 ENCOUNTER — Other Ambulatory Visit (HOSPITAL_COMMUNITY): Payer: Self-pay

## 2023-06-02 ENCOUNTER — Other Ambulatory Visit: Payer: Self-pay

## 2023-06-02 MED ORDER — VORTIOXETINE HBR 20 MG PO TABS
20.0000 mg | ORAL_TABLET | Freq: Every day | ORAL | 1 refills | Status: DC
Start: 2023-06-02 — End: 2023-11-21
  Filled 2023-06-02 – 2023-06-03 (×2): qty 90, 90d supply, fill #0
  Filled 2023-08-30: qty 90, 90d supply, fill #1

## 2023-06-03 ENCOUNTER — Other Ambulatory Visit: Payer: Self-pay

## 2023-06-03 ENCOUNTER — Other Ambulatory Visit (HOSPITAL_COMMUNITY): Payer: Self-pay

## 2023-06-16 ENCOUNTER — Other Ambulatory Visit: Payer: Self-pay | Admitting: Family Medicine

## 2023-06-16 ENCOUNTER — Other Ambulatory Visit (HOSPITAL_COMMUNITY): Payer: Self-pay

## 2023-06-16 MED ORDER — ESOMEPRAZOLE MAGNESIUM 20 MG PO CPDR
20.0000 mg | DELAYED_RELEASE_CAPSULE | Freq: Every day | ORAL | 1 refills | Status: AC
  Filled 2023-06-16: qty 90, 90d supply, fill #0
  Filled 2023-11-06: qty 90, 90d supply, fill #1

## 2023-06-17 ENCOUNTER — Other Ambulatory Visit: Payer: Self-pay

## 2023-08-30 ENCOUNTER — Other Ambulatory Visit: Payer: Self-pay | Admitting: Family Medicine

## 2023-08-30 ENCOUNTER — Other Ambulatory Visit (HOSPITAL_COMMUNITY): Payer: Self-pay

## 2023-09-01 ENCOUNTER — Other Ambulatory Visit: Payer: Self-pay

## 2023-09-01 ENCOUNTER — Other Ambulatory Visit (INDEPENDENT_AMBULATORY_CARE_PROVIDER_SITE_OTHER)

## 2023-09-01 ENCOUNTER — Other Ambulatory Visit (HOSPITAL_BASED_OUTPATIENT_CLINIC_OR_DEPARTMENT_OTHER): Payer: Self-pay

## 2023-09-01 ENCOUNTER — Other Ambulatory Visit (HOSPITAL_COMMUNITY): Payer: Self-pay

## 2023-09-01 DIAGNOSIS — E559 Vitamin D deficiency, unspecified: Secondary | ICD-10-CM

## 2023-09-01 DIAGNOSIS — E538 Deficiency of other specified B group vitamins: Secondary | ICD-10-CM

## 2023-09-01 LAB — VITAMIN D 25 HYDROXY (VIT D DEFICIENCY, FRACTURES): VITD: 24.6 ng/mL — ABNORMAL LOW (ref 30.00–100.00)

## 2023-09-01 LAB — VITAMIN B12: Vitamin B-12: 182 pg/mL — ABNORMAL LOW (ref 211–911)

## 2023-09-01 MED ORDER — AMLODIPINE BESYLATE 5 MG PO TABS
5.0000 mg | ORAL_TABLET | Freq: Every day | ORAL | 1 refills | Status: AC
  Filled 2023-09-01 – 2023-09-02 (×4): qty 90, 90d supply, fill #0
  Filled 2023-11-06 – 2023-11-21 (×2): qty 90, 90d supply, fill #1

## 2023-09-02 ENCOUNTER — Other Ambulatory Visit: Payer: Self-pay

## 2023-09-02 ENCOUNTER — Other Ambulatory Visit (HOSPITAL_COMMUNITY): Payer: Self-pay

## 2023-09-08 ENCOUNTER — Ambulatory Visit: Payer: Self-pay | Admitting: Family Medicine

## 2023-09-11 ENCOUNTER — Ambulatory Visit: Admitting: Family Medicine

## 2023-11-05 ENCOUNTER — Other Ambulatory Visit (HOSPITAL_COMMUNITY): Payer: Self-pay

## 2023-11-06 ENCOUNTER — Other Ambulatory Visit: Payer: Self-pay | Admitting: Family Medicine

## 2023-11-06 ENCOUNTER — Encounter: Payer: Self-pay | Admitting: Urology

## 2023-11-06 ENCOUNTER — Other Ambulatory Visit: Payer: Self-pay

## 2023-11-06 ENCOUNTER — Other Ambulatory Visit (HOSPITAL_COMMUNITY): Payer: Self-pay

## 2023-11-07 ENCOUNTER — Ambulatory Visit: Admitting: Urology

## 2023-11-21 ENCOUNTER — Encounter: Payer: Self-pay | Admitting: Family Medicine

## 2023-11-21 ENCOUNTER — Other Ambulatory Visit: Payer: Self-pay | Admitting: Family Medicine

## 2023-11-21 ENCOUNTER — Other Ambulatory Visit (HOSPITAL_COMMUNITY): Payer: Self-pay

## 2023-11-21 DIAGNOSIS — F32A Depression, unspecified: Secondary | ICD-10-CM

## 2023-11-24 ENCOUNTER — Other Ambulatory Visit (HOSPITAL_COMMUNITY): Payer: Self-pay

## 2023-11-24 ENCOUNTER — Other Ambulatory Visit: Payer: Self-pay

## 2023-11-24 MED ORDER — TELMISARTAN-HCTZ 80-25 MG PO TABS
1.0000 | ORAL_TABLET | Freq: Every day | ORAL | 0 refills | Status: AC
  Filled 2023-11-24: qty 90, 90d supply, fill #0

## 2023-11-24 MED ORDER — VORTIOXETINE HBR 20 MG PO TABS
20.0000 mg | ORAL_TABLET | Freq: Every day | ORAL | 0 refills | Status: AC
  Filled 2023-11-24: qty 90, 90d supply, fill #0

## 2023-11-24 MED ORDER — DOXAZOSIN MESYLATE 1 MG PO TABS
1.0000 mg | ORAL_TABLET | Freq: Every day | ORAL | 0 refills | Status: AC
  Filled 2023-11-24: qty 90, 90d supply, fill #0

## 2023-11-24 MED ORDER — ROSUVASTATIN CALCIUM 5 MG PO TABS
5.0000 mg | ORAL_TABLET | Freq: Every day | ORAL | 0 refills | Status: AC
  Filled 2023-11-24: qty 90, 90d supply, fill #0

## 2023-11-27 ENCOUNTER — Other Ambulatory Visit (HOSPITAL_COMMUNITY): Payer: Self-pay

## 2023-11-27 ENCOUNTER — Encounter (HOSPITAL_COMMUNITY): Payer: Self-pay

## 2024-01-30 ENCOUNTER — Other Ambulatory Visit (HOSPITAL_COMMUNITY): Payer: Self-pay

## 2024-01-30 ENCOUNTER — Encounter (HOSPITAL_COMMUNITY): Payer: Self-pay | Admitting: Pharmacist

## 2024-02-04 ENCOUNTER — Other Ambulatory Visit (HOSPITAL_COMMUNITY): Payer: Self-pay

## 2024-02-23 ENCOUNTER — Other Ambulatory Visit: Payer: Self-pay
